# Patient Record
Sex: Male | Born: 1960 | Race: White | Hispanic: No | Marital: Single | Smoking: Former smoker
Health system: Southern US, Community
[De-identification: ages and names within clinical notes are randomized; demographics above are authoritative.]

## PROBLEM LIST (undated history)

## (undated) DIAGNOSIS — Z8719 Personal history of other diseases of the digestive system: Secondary | ICD-10-CM

## (undated) DIAGNOSIS — G4730 Sleep apnea, unspecified: Secondary | ICD-10-CM

## (undated) DIAGNOSIS — J449 Chronic obstructive pulmonary disease, unspecified: Secondary | ICD-10-CM

## (undated) DIAGNOSIS — N189 Chronic kidney disease, unspecified: Secondary | ICD-10-CM

## (undated) DIAGNOSIS — I10 Essential (primary) hypertension: Secondary | ICD-10-CM

## (undated) DIAGNOSIS — K219 Gastro-esophageal reflux disease without esophagitis: Secondary | ICD-10-CM

## (undated) DIAGNOSIS — F32A Depression, unspecified: Secondary | ICD-10-CM

## (undated) DIAGNOSIS — I739 Peripheral vascular disease, unspecified: Secondary | ICD-10-CM

## (undated) DIAGNOSIS — T8859XA Other complications of anesthesia, initial encounter: Secondary | ICD-10-CM

## (undated) DIAGNOSIS — R0600 Dyspnea, unspecified: Secondary | ICD-10-CM

## (undated) DIAGNOSIS — E1143 Type 2 diabetes mellitus with diabetic autonomic (poly)neuropathy: Secondary | ICD-10-CM

## (undated) DIAGNOSIS — I2510 Atherosclerotic heart disease of native coronary artery without angina pectoris: Secondary | ICD-10-CM

## (undated) DIAGNOSIS — I209 Angina pectoris, unspecified: Secondary | ICD-10-CM

## (undated) DIAGNOSIS — M1990 Unspecified osteoarthritis, unspecified site: Secondary | ICD-10-CM

## (undated) DIAGNOSIS — G709 Myoneural disorder, unspecified: Secondary | ICD-10-CM

## (undated) HISTORY — PX: NO PAST SURGERIES: SHX2092

---

## 2005-09-19 ENCOUNTER — Emergency Department: Payer: Self-pay | Admitting: Emergency Medicine

## 2005-09-19 ENCOUNTER — Other Ambulatory Visit: Payer: Self-pay

## 2005-09-26 ENCOUNTER — Ambulatory Visit: Payer: Self-pay | Admitting: Family Medicine

## 2005-09-27 ENCOUNTER — Ambulatory Visit: Payer: Self-pay | Admitting: Family Medicine

## 2006-07-24 ENCOUNTER — Emergency Department (HOSPITAL_COMMUNITY): Admission: EM | Admit: 2006-07-24 | Discharge: 2006-07-24 | Payer: Self-pay | Admitting: Emergency Medicine

## 2006-09-30 ENCOUNTER — Encounter: Admission: RE | Admit: 2006-09-30 | Discharge: 2006-09-30 | Payer: Self-pay | Admitting: Neurosurgery

## 2011-08-16 ENCOUNTER — Encounter (HOSPITAL_COMMUNITY): Payer: Self-pay | Admitting: Pharmacy Technician

## 2011-08-23 ENCOUNTER — Other Ambulatory Visit (HOSPITAL_COMMUNITY): Payer: Self-pay | Admitting: Orthopaedic Surgery

## 2011-08-26 ENCOUNTER — Other Ambulatory Visit: Payer: Self-pay

## 2011-08-26 ENCOUNTER — Encounter (HOSPITAL_COMMUNITY): Payer: Self-pay

## 2011-08-26 ENCOUNTER — Encounter (HOSPITAL_COMMUNITY)
Admission: RE | Admit: 2011-08-26 | Discharge: 2011-08-26 | Disposition: A | Discharge status: Home / Self Care | Payer: Worker's Compensation | Source: Ambulatory Visit | Attending: Orthopaedic Surgery | Admitting: Orthopaedic Surgery

## 2011-08-26 HISTORY — DX: Personal history of other diseases of the digestive system: Z87.19

## 2011-08-26 HISTORY — DX: Gastro-esophageal reflux disease without esophagitis: K21.9

## 2011-08-26 HISTORY — DX: Peripheral vascular disease, unspecified: I73.9

## 2011-08-26 HISTORY — DX: Myoneural disorder, unspecified: G70.9

## 2011-08-26 LAB — CBC
MCH: 31.2 pg (ref 26.0–34.0)
MCHC: 34.2 g/dL (ref 30.0–36.0)
MCV: 91.2 fL (ref 78.0–100.0)
Platelets: 181 10*3/uL (ref 150–400)
WBC: 6.7 10*3/uL (ref 4.0–10.5)

## 2011-08-26 LAB — BASIC METABOLIC PANEL: GFR calc non Af Amer: 81 mL/min — ABNORMAL LOW (ref >90)

## 2011-08-26 LAB — URINALYSIS, ROUTINE W REFLEX MICROSCOPIC
Bilirubin Urine: NEGATIVE
Glucose, UA: >1000 mg/dL — AB
Leukocytes, UA: NEGATIVE

## 2011-08-26 LAB — URINE MICROSCOPIC-ADD ON

## 2011-08-26 LAB — SURGICAL PCR SCREEN: Staphylococcus aureus: NEGATIVE

## 2011-08-26 NOTE — Pre-Procedure Instructions (Signed)
20 Nathaniel Colon  08/26/2011   Your procedure is scheduled on:   Friday 08/30/11  Report to [facility] at 530 AM.  Call this number if you have problems the morning of surgery: [phone]   Remember:   Do not eat food:After Midnight.  May have clear liquids: up to 4 Hours before arrival.  Clear liquids include soda, tea, black coffee, apple or grape juice, broth.  Take these medicines the morning of surgery with A SIP OF WATER:     Do not wear jewelry, make-up or nail polish.  Do not wear lotions, powders, or perfumes. You may wear deodorant.  Do not shave 48 hours prior to surgery.  Do not bring valuables to the hospital.  Contacts, dentures or bridgework may not be worn into surgery.  Leave suitcase in the car. After surgery it may be brought to your room.  For patients admitted to the hospital, checkout time is 11:00 AM the day of discharge.   Patients discharged the day of surgery will not be allowed to drive home.  Name and phone number of your driver:   Special Instructions: CHG Shower Use Special Wash: 1/2 bottle night before surgery and 1/2 bottle morning of surgery.   Please read over the following fact sheets that you were given: Pain Booklet, Coughing and Deep Breathing, MRSA Information and Surgical Site Infection Prevention

## 2011-08-29 MED ORDER — CEFAZOLIN SODIUM 1-5 GM-% IV SOLN
1.0000 g | INTRAVENOUS | Status: AC
  Administered 2011-08-30: 1 g via INTRAVENOUS
  Filled 2011-08-29: qty 50

## 2011-08-30 ENCOUNTER — Encounter (HOSPITAL_COMMUNITY)
Admission: RE | Disposition: A | Discharge status: Home / Self Care | Payer: Self-pay | Source: Ambulatory Visit | Attending: Orthopaedic Surgery

## 2011-08-30 ENCOUNTER — Ambulatory Visit (HOSPITAL_COMMUNITY): Payer: Worker's Compensation | Admitting: Anesthesiology

## 2011-08-30 ENCOUNTER — Encounter (HOSPITAL_COMMUNITY): Payer: Self-pay | Admitting: Anesthesiology

## 2011-08-30 ENCOUNTER — Encounter (HOSPITAL_COMMUNITY): Payer: Self-pay | Admitting: *Deleted

## 2011-08-30 ENCOUNTER — Ambulatory Visit (HOSPITAL_COMMUNITY)
Admission: RE | Admit: 2011-08-30 | Discharge: 2011-09-02 | Disposition: A | Discharge status: Home / Self Care | Payer: Worker's Compensation | Source: Ambulatory Visit | Attending: Orthopaedic Surgery | Admitting: Orthopaedic Surgery

## 2011-08-30 ENCOUNTER — Ambulatory Visit (HOSPITAL_COMMUNITY): Payer: Worker's Compensation

## 2011-08-30 DIAGNOSIS — K219 Gastro-esophageal reflux disease without esophagitis: Secondary | ICD-10-CM | POA: Insufficient documentation

## 2011-08-30 DIAGNOSIS — I739 Peripheral vascular disease, unspecified: Secondary | ICD-10-CM | POA: Insufficient documentation

## 2011-08-30 DIAGNOSIS — M47812 Spondylosis without myelopathy or radiculopathy, cervical region: Secondary | ICD-10-CM | POA: Insufficient documentation

## 2011-08-30 DIAGNOSIS — K449 Diaphragmatic hernia without obstruction or gangrene: Secondary | ICD-10-CM | POA: Insufficient documentation

## 2011-08-30 DIAGNOSIS — F17200 Nicotine dependence, unspecified, uncomplicated: Secondary | ICD-10-CM | POA: Insufficient documentation

## 2011-08-30 DIAGNOSIS — E119 Type 2 diabetes mellitus without complications: Secondary | ICD-10-CM | POA: Insufficient documentation

## 2011-08-30 DIAGNOSIS — M5020 Other cervical disc displacement, unspecified cervical region: Secondary | ICD-10-CM | POA: Diagnosis present

## 2011-08-30 DIAGNOSIS — M7710 Lateral epicondylitis, unspecified elbow: Secondary | ICD-10-CM | POA: Insufficient documentation

## 2011-08-30 DIAGNOSIS — M4802 Spinal stenosis, cervical region: Secondary | ICD-10-CM | POA: Diagnosis present

## 2011-08-30 DIAGNOSIS — M7711 Lateral epicondylitis, right elbow: Secondary | ICD-10-CM | POA: Diagnosis present

## 2011-08-30 HISTORY — PX: ANTERIOR CERVICAL DECOMP/DISCECTOMY FUSION: SHX1161

## 2011-08-30 HISTORY — PX: LATERAL EPICONDYLE RELEASE: SHX1958

## 2011-08-30 LAB — GLUCOSE, CAPILLARY
Glucose-Capillary: 268 mg/dL — ABNORMAL HIGH (ref 70–99)
Glucose-Capillary: 241 mg/dL — ABNORMAL HIGH (ref 70–99)
Glucose-Capillary: 240 mg/dL — ABNORMAL HIGH (ref 70–99)
Glucose-Capillary: 191 mg/dL — ABNORMAL HIGH (ref 70–99)

## 2011-08-30 SURGERY — ANTERIOR CERVICAL DECOMPRESSION/DISCECTOMY FUSION 2 LEVELS
Anesthesia: General | Site: Neck | Laterality: Right | Wound class: Clean

## 2011-08-30 MED ORDER — HYDROMORPHONE HCL PF 1 MG/ML IJ SOLN
0.2500 mg | INTRAMUSCULAR | Status: DC | PRN
  Administered 2011-08-30 (×4): 0.5 mg via INTRAVENOUS

## 2011-08-30 MED ORDER — ZOLPIDEM TARTRATE 10 MG PO TABS: 10.0000 mg | ORAL_TABLET | Freq: Every evening | ORAL | Status: DC | PRN

## 2011-08-30 MED ORDER — SODIUM CHLORIDE 0.9 % IV SOLN: 250.0000 mL | INTRAVENOUS | Status: DC

## 2011-08-30 MED ORDER — DIPHENHYDRAMINE HCL 12.5 MG/5ML PO ELIX
12.5000 mg | ORAL_SOLUTION | Freq: Four times a day (QID) | ORAL | Status: DC | PRN
  Filled 2011-08-30: qty 5

## 2011-08-30 MED ORDER — SENNOSIDES-DOCUSATE SODIUM 8.6-50 MG PO TABS: 1.0000 | ORAL_TABLET | Freq: Every evening | ORAL | Status: DC | PRN

## 2011-08-30 MED ORDER — HYDROCODONE-ACETAMINOPHEN 5-325 MG PO TABS: 1.0000 | ORAL_TABLET | ORAL | Status: DC | PRN

## 2011-08-30 MED ORDER — PANTOPRAZOLE SODIUM 40 MG IV SOLR
40.0000 mg | Freq: Every day | INTRAVENOUS | Status: DC
  Administered 2011-08-30: 40 mg via INTRAVENOUS
  Filled 2011-08-30 (×2): qty 40

## 2011-08-30 MED ORDER — BUPIVACAINE-EPINEPHRINE 0.25% -1:200000 IJ SOLN
INTRAMUSCULAR | Status: DC | PRN
  Administered 2011-08-30: 7 mL

## 2011-08-30 MED ORDER — INSULIN ASPART 100 UNIT/ML ~~LOC~~ SOLN
0.0000 [IU] | Freq: Every day | SUBCUTANEOUS | Status: DC
  Administered 2011-09-01: 2 [IU] via SUBCUTANEOUS

## 2011-08-30 MED ORDER — DIPHENHYDRAMINE HCL 50 MG/ML IJ SOLN
12.5000 mg | Freq: Four times a day (QID) | INTRAMUSCULAR | Status: DC | PRN
  Filled 2011-08-30: qty 0.25

## 2011-08-30 MED ORDER — METFORMIN HCL 500 MG PO TABS
1000.0000 mg | ORAL_TABLET | Freq: Two times a day (BID) | ORAL | Status: DC
  Administered 2011-08-31 – 2011-09-02 (×5): 1000 mg via ORAL
  Filled 2011-08-30 (×7): qty 2

## 2011-08-30 MED ORDER — PHENOL 1.4 % MT LIQD
1.0000 | OROMUCOSAL | Status: DC | PRN
  Filled 2011-08-30: qty 177

## 2011-08-30 MED ORDER — ONDANSETRON HCL 4 MG/2ML IJ SOLN
INTRAMUSCULAR | Status: DC | PRN
  Administered 2011-08-30: 4 mg via INTRAVENOUS

## 2011-08-30 MED ORDER — ACETAMINOPHEN 650 MG RE SUPP: 650.0000 mg | RECTAL | Status: DC | PRN

## 2011-08-30 MED ORDER — OXYCODONE-ACETAMINOPHEN 5-325 MG PO TABS: 1.0000 | ORAL_TABLET | ORAL | Status: AC | PRN

## 2011-08-30 MED ORDER — BUPIVACAINE-EPINEPHRINE PF 0.25-1:200000 % IJ SOLN
INTRAMUSCULAR | Status: DC | PRN
  Administered 2011-08-30: 6 mL

## 2011-08-30 MED ORDER — 0.9 % SODIUM CHLORIDE (POUR BTL) OPTIME
TOPICAL | Status: DC | PRN
  Administered 2011-08-30: 1000 mL

## 2011-08-30 MED ORDER — METHOCARBAMOL 500 MG PO TABS
500.0000 mg | ORAL_TABLET | Freq: Four times a day (QID) | ORAL | Status: DC | PRN
  Administered 2011-08-30 – 2011-09-02 (×5): 500 mg via ORAL
  Filled 2011-08-30 (×6): qty 1

## 2011-08-30 MED ORDER — ONDANSETRON HCL 4 MG/2ML IJ SOLN: 4.0000 mg | INTRAMUSCULAR | Status: DC | PRN

## 2011-08-30 MED ORDER — THROMBIN 20000 UNITS EX KIT
PACK | CUTANEOUS | Status: DC | PRN
  Administered 2011-08-30: 09:00:00 via TOPICAL

## 2011-08-30 MED ORDER — GLIMEPIRIDE 4 MG PO TABS
4.0000 mg | ORAL_TABLET | Freq: Two times a day (BID) | ORAL | Status: DC
  Administered 2011-08-30 – 2011-09-02 (×6): 4 mg via ORAL
  Filled 2011-08-30 (×8): qty 1

## 2011-08-30 MED ORDER — INSULIN ASPART 100 UNIT/ML ~~LOC~~ SOLN
0.0000 [IU] | Freq: Three times a day (TID) | SUBCUTANEOUS | Status: DC
  Administered 2011-08-30: 5 [IU] via SUBCUTANEOUS
  Administered 2011-08-31 (×2): 3 [IU] via SUBCUTANEOUS
  Administered 2011-08-31: 15 [IU] via SUBCUTANEOUS
  Administered 2011-09-01: 2 [IU] via SUBCUTANEOUS
  Administered 2011-09-01: 5 [IU] via SUBCUTANEOUS
  Administered 2011-09-01 – 2011-09-02 (×2): 3 [IU] via SUBCUTANEOUS
  Administered 2011-09-02: 2 [IU] via SUBCUTANEOUS
  Filled 2011-08-30: qty 3

## 2011-08-30 MED ORDER — LACTATED RINGERS IV SOLN
INTRAVENOUS | Status: DC | PRN
  Administered 2011-08-30 (×2): via INTRAVENOUS

## 2011-08-30 MED ORDER — DOCUSATE SODIUM 100 MG PO CAPS
100.0000 mg | ORAL_CAPSULE | Freq: Two times a day (BID) | ORAL | Status: DC
  Administered 2011-08-30 – 2011-09-02 (×6): 100 mg via ORAL
  Filled 2011-08-30 (×7): qty 1

## 2011-08-30 MED ORDER — MORPHINE SULFATE (PF) 1 MG/ML IV SOLN
INTRAVENOUS | Status: DC
  Administered 2011-08-30: 3 mg via INTRAVENOUS
  Administered 2011-08-30: 10 mg via INTRAVENOUS
  Administered 2011-08-30: 6.64 mg via INTRAVENOUS
  Administered 2011-08-31: 4.5 mg via INTRAVENOUS
  Administered 2011-08-31 (×2): 12 mg via INTRAVENOUS
  Administered 2011-08-31 (×2): 6 mg via INTRAVENOUS
  Filled 2011-08-30 (×4): qty 25

## 2011-08-30 MED ORDER — KETOROLAC TROMETHAMINE 30 MG/ML IJ SOLN
INTRAMUSCULAR | Status: AC
  Filled 2011-08-30: qty 1

## 2011-08-30 MED ORDER — OXYCODONE-ACETAMINOPHEN 5-325 MG PO TABS
1.0000 | ORAL_TABLET | ORAL | Status: DC | PRN
  Administered 2011-08-31 – 2011-09-02 (×8): 2 via ORAL
  Filled 2011-08-30 (×8): qty 2

## 2011-08-30 MED ORDER — POTASSIUM CHLORIDE IN NACL 20-0.45 MEQ/L-% IV SOLN
INTRAVENOUS | Status: DC
  Administered 2011-08-30: via INTRAVENOUS
  Filled 2011-08-30 (×8): qty 1000

## 2011-08-30 MED ORDER — FLEET ENEMA 7-19 GM/118ML RE ENEM: 1.0000 | ENEMA | Freq: Once | RECTAL | Status: AC | PRN

## 2011-08-30 MED ORDER — VECURONIUM BROMIDE 10 MG IV SOLR
INTRAVENOUS | Status: DC | PRN
  Administered 2011-08-30 (×2): 2 mg via INTRAVENOUS
  Administered 2011-08-30: 1 mg via INTRAVENOUS

## 2011-08-30 MED ORDER — SUFENTANIL CITRATE 50 MCG/ML IV SOLN
INTRAVENOUS | Status: DC | PRN
  Administered 2011-08-30: 5 ug via INTRAVENOUS
  Administered 2011-08-30: 25 ug via INTRAVENOUS
  Administered 2011-08-30: 5 ug via INTRAVENOUS

## 2011-08-30 MED ORDER — NALOXONE HCL 0.4 MG/ML IJ SOLN
0.4000 mg | INTRAMUSCULAR | Status: DC | PRN
  Filled 2011-08-30: qty 1

## 2011-08-30 MED ORDER — GLYCOPYRROLATE 0.2 MG/ML IJ SOLN
INTRAMUSCULAR | Status: DC | PRN
  Administered 2011-08-30: .6 mg via INTRAVENOUS

## 2011-08-30 MED ORDER — ALUM & MAG HYDROXIDE-SIMETH 200-200-20 MG/5ML PO SUSP
30.0000 mL | Freq: Four times a day (QID) | ORAL | Status: DC | PRN
  Administered 2011-08-30: 30 mL via ORAL
  Filled 2011-08-30: qty 30

## 2011-08-30 MED ORDER — PROPOFOL 10 MG/ML IV EMUL
INTRAVENOUS | Status: DC | PRN
  Administered 2011-08-30: 200 mg via INTRAVENOUS

## 2011-08-30 MED ORDER — PHENYLEPHRINE HCL 10 MG/ML IJ SOLN
INTRAMUSCULAR | Status: DC | PRN
  Administered 2011-08-30 (×3): 40 ug via INTRAVENOUS

## 2011-08-30 MED ORDER — ONDANSETRON HCL 4 MG/2ML IJ SOLN: 4.0000 mg | Freq: Once | INTRAMUSCULAR | Status: DC | PRN

## 2011-08-30 MED ORDER — HYDROMORPHONE HCL PF 1 MG/ML IJ SOLN
INTRAMUSCULAR | Status: AC
  Filled 2011-08-30: qty 1

## 2011-08-30 MED ORDER — BISACODYL 10 MG RE SUPP: 10.0000 mg | Freq: Every day | RECTAL | Status: DC | PRN

## 2011-08-30 MED ORDER — LIDOCAINE HCL (CARDIAC) 20 MG/ML IV SOLN
INTRAVENOUS | Status: DC | PRN
  Administered 2011-08-30: 50 mg via INTRAVENOUS

## 2011-08-30 MED ORDER — OXYCODONE-ACETAMINOPHEN 5-325 MG PO TABS: 1.0000 | ORAL_TABLET | ORAL | Status: DC | PRN

## 2011-08-30 MED ORDER — SODIUM CHLORIDE 0.9 % IJ SOLN: 9.0000 mL | INTRAMUSCULAR | Status: DC | PRN

## 2011-08-30 MED ORDER — SODIUM CHLORIDE 0.9 % IJ SOLN
3.0000 mL | Freq: Two times a day (BID) | INTRAMUSCULAR | Status: DC
  Administered 2011-08-31 – 2011-09-01 (×3): 3 mL via INTRAVENOUS

## 2011-08-30 MED ORDER — ACETAMINOPHEN 325 MG PO TABS: 650.0000 mg | ORAL_TABLET | ORAL | Status: DC | PRN

## 2011-08-30 MED ORDER — ONDANSETRON HCL 4 MG/2ML IJ SOLN
4.0000 mg | Freq: Four times a day (QID) | INTRAMUSCULAR | Status: DC | PRN
  Filled 2011-08-30: qty 2

## 2011-08-30 MED ORDER — KETOROLAC TROMETHAMINE 30 MG/ML IJ SOLN
30.0000 mg | Freq: Once | INTRAMUSCULAR | Status: AC
Start: 2011-08-30 — End: 2011-08-30
  Administered 2011-08-30: 30 mg via INTRAVENOUS

## 2011-08-30 MED ORDER — SODIUM CHLORIDE 0.9 % IJ SOLN: 3.0000 mL | INTRAMUSCULAR | Status: DC | PRN

## 2011-08-30 MED ORDER — METHOCARBAMOL 100 MG/ML IJ SOLN
500.0000 mg | Freq: Four times a day (QID) | INTRAVENOUS | Status: DC | PRN
  Administered 2011-08-30: 500 mg via INTRAVENOUS
  Filled 2011-08-30: qty 5

## 2011-08-30 MED ORDER — CEFAZOLIN SODIUM 1-5 GM-% IV SOLN
1.0000 g | Freq: Three times a day (TID) | INTRAVENOUS | Status: AC
  Administered 2011-08-30 (×2): 1 g via INTRAVENOUS
  Filled 2011-08-30 (×2): qty 50

## 2011-08-30 MED ORDER — MENTHOL 3 MG MT LOZG
1.0000 | LOZENGE | OROMUCOSAL | Status: DC | PRN
  Filled 2011-08-30: qty 9

## 2011-08-30 MED ORDER — SIMVASTATIN 20 MG PO TABS
20.0000 mg | ORAL_TABLET | Freq: Every day | ORAL | Status: DC
  Administered 2011-08-30 – 2011-09-01 (×3): 20 mg via ORAL
  Filled 2011-08-30 (×4): qty 1

## 2011-08-30 MED ORDER — PIOGLITAZONE HCL 45 MG PO TABS
45.0000 mg | ORAL_TABLET | Freq: Every day | ORAL | Status: DC
  Administered 2011-08-31 – 2011-09-02 (×3): 45 mg via ORAL
  Filled 2011-08-30 (×3): qty 1

## 2011-08-30 MED ORDER — ROCURONIUM BROMIDE 100 MG/10ML IV SOLN
INTRAVENOUS | Status: DC | PRN
  Administered 2011-08-30: 50 mg via INTRAVENOUS

## 2011-08-30 MED ORDER — NEOSTIGMINE METHYLSULFATE 1 MG/ML IJ SOLN
INTRAMUSCULAR | Status: DC | PRN
  Administered 2011-08-30: 5 mg via INTRAVENOUS

## 2011-08-30 SURGICAL SUPPLY — 68 items
BANDAGE CONFORM 3  STR LF (GAUZE/BANDAGES/DRESSINGS) IMPLANT
BANDAGE ELASTIC 4 VELCRO ST LF (GAUZE/BANDAGES/DRESSINGS) ×3 IMPLANT
BANDAGE ELASTIC 6 VELCRO ST LF (GAUZE/BANDAGES/DRESSINGS) ×3 IMPLANT
BENZOIN TINCTURE PRP APPL 2/3 (GAUZE/BANDAGES/DRESSINGS) ×3 IMPLANT
BLADE SURG 10 STRL SS (BLADE) ×3 IMPLANT
BLADE SURG 15 STRL LF DISP TIS (BLADE) ×2 IMPLANT
BLADE SURG 15 STRL SS (BLADE) ×1
BLADE SURG ROTATE 9660 (MISCELLANEOUS) IMPLANT
BUR ROUND FLUTED 4 SOFT TCH (BURR) IMPLANT
CLOSURE STERI STRIP 1/2 X4 (GAUZE/BANDAGES/DRESSINGS) ×6 IMPLANT
CLOTH BEACON ORANGE TIMEOUT ST (SAFETY) ×6 IMPLANT
COLLAR CERV LO CONTOUR FIRM DE (SOFTGOODS) ×3 IMPLANT
COMPOSITE CERV ALLOGRAFT 7MM (Orthopedic Implant) ×6 IMPLANT
CORDS BIPOLAR (ELECTRODE) ×6 IMPLANT
COVER SURGICAL LIGHT HANDLE (MISCELLANEOUS) ×6 IMPLANT
CUFF TOURNIQUET SINGLE 18IN (TOURNIQUET CUFF) IMPLANT
CUFF TOURNIQUET SINGLE 24IN (TOURNIQUET CUFF) IMPLANT
DRAPE C-ARM 42X72 X-RAY (DRAPES) ×3 IMPLANT
DRAPE MICROSCOPE LEICA (MISCELLANEOUS) ×3 IMPLANT
DRAPE PROXIMA HALF (DRAPES) ×3 IMPLANT
DURAPREP 26ML APPLICATOR (WOUND CARE) ×3 IMPLANT
DURAPREP 6ML APPLICATOR 50/CS (WOUND CARE) ×3 IMPLANT
ELECT CAUTERY BLADE 6.4 (BLADE) ×3 IMPLANT
ELECT COATED BLADE 2.86 ST (ELECTRODE) ×3 IMPLANT
ELECT REM PT RETURN 9FT ADLT (ELECTROSURGICAL) ×3
ELECTRODE REM PT RTRN 9FT ADLT (ELECTROSURGICAL) ×2 IMPLANT
EVACUATOR 1/8 PVC DRAIN (DRAIN) ×3 IMPLANT
GAUZE SPONGE 4X4 12PLY STRL LF (GAUZE/BANDAGES/DRESSINGS) ×6 IMPLANT
GAUZE XEROFORM 1X8 LF (GAUZE/BANDAGES/DRESSINGS) ×3 IMPLANT
GLOVE BIOGEL PI IND STRL 7.5 (GLOVE) ×2 IMPLANT
GLOVE BIOGEL PI IND STRL 8 (GLOVE) ×4 IMPLANT
GLOVE BIOGEL PI INDICATOR 7.5 (GLOVE) ×1
GLOVE BIOGEL PI INDICATOR 8 (GLOVE) ×2
GLOVE ECLIPSE 7.0 STRL STRAW (GLOVE) ×3 IMPLANT
GLOVE ORTHO TXT STRL SZ7.5 (GLOVE) ×6 IMPLANT
GOWN PREVENTION PLUS LG XLONG (DISPOSABLE) IMPLANT
GOWN PREVENTION PLUS XLARGE (GOWN DISPOSABLE) ×3 IMPLANT
GOWN STRL NON-REIN LRG LVL3 (GOWN DISPOSABLE) ×12 IMPLANT
HEAD HALTER (SOFTGOODS) ×3 IMPLANT
HEMOSTAT SURGICEL 2X14 (HEMOSTASIS) IMPLANT
KIT BASIN OR (CUSTOM PROCEDURE TRAY) ×6 IMPLANT
KIT ROOM TURNOVER OR (KITS) ×6 IMPLANT
MANIFOLD NEPTUNE II (INSTRUMENTS) ×6 IMPLANT
NEEDLE 25GX 5/8IN NON SAFETY (NEEDLE) ×3 IMPLANT
NS IRRIG 1000ML POUR BTL (IV SOLUTION) ×6 IMPLANT
PACK ORTHO CERVICAL (CUSTOM PROCEDURE TRAY) ×3 IMPLANT
PACK ORTHO EXTREMITY (CUSTOM PROCEDURE TRAY) ×3 IMPLANT
PAD ARMBOARD 7.5X6 YLW CONV (MISCELLANEOUS) ×12 IMPLANT
PATTIES SURGICAL .5 X.5 (GAUZE/BANDAGES/DRESSINGS) IMPLANT
SPONGE GAUZE 4X4 12PLY (GAUZE/BANDAGES/DRESSINGS) ×6 IMPLANT
SPONGE LAP 18X18 X RAY DECT (DISPOSABLE) ×3 IMPLANT
STAPLER VISISTAT 35W (STAPLE) ×3 IMPLANT
STRIP CLOSURE SKIN 1/2X4 (GAUZE/BANDAGES/DRESSINGS) ×6 IMPLANT
SUCTION FRAZIER TIP 10 FR DISP (SUCTIONS) IMPLANT
SURGIFLO TRUKIT (HEMOSTASIS) IMPLANT
SUT ETHILON 4 0 P 3 18 (SUTURE) IMPLANT
SUT VIC AB 2-0 CT1 27 (SUTURE) ×1
SUT VIC AB 2-0 CT1 TAPERPNT 27 (SUTURE) ×2 IMPLANT
SUT VIC AB 3-0 FS2 27 (SUTURE) ×3 IMPLANT
SUT VIC AB 3-0 X1 27 (SUTURE) ×3 IMPLANT
SUT VICRYL 4-0 PS2 18IN ABS (SUTURE) ×6 IMPLANT
SYR 30ML SLIP (SYRINGE) ×3 IMPLANT
SYR BULB 3OZ (MISCELLANEOUS) ×3 IMPLANT
TAPE CLOTH SURG 6X10 WHT LF (GAUZE/BANDAGES/DRESSINGS) ×3 IMPLANT
TOWEL OR 17X24 6PK STRL BLUE (TOWEL DISPOSABLE) ×6 IMPLANT
TOWEL OR 17X26 10 PK STRL BLUE (TOWEL DISPOSABLE) ×6 IMPLANT
TUBE CONNECTING 12X1/4 (SUCTIONS) IMPLANT
WATER STERILE IRR 1000ML POUR (IV SOLUTION) ×6 IMPLANT

## 2011-08-30 NOTE — Anesthesia Procedure Notes (Signed)
Procedure Name: Intubation Date/Time: 08/30/2011 8:06 AM Performed by: Romie Minus Pre-anesthesia Checklist: Emergency Drugs available, Patient identified, Suction available and Patient being monitored Patient Re-evaluated:Patient Re-evaluated prior to inductionOxygen Delivery Method: Circle System Utilized Preoxygenation: Pre-oxygenation with 100% oxygen Intubation Type: IV induction Ventilation: Mask ventilation without difficulty and Oral airway inserted - appropriate to patient size Laryngoscope Size: Miller and 2 Grade View: Grade II Tube type: Oral Tube size: 7.5 mm Number of attempts: 1 Secured at: 22 cm Tube secured with: Tape Dental Injury: Teeth and Oropharynx as per pre-operative assessment

## 2011-08-30 NOTE — Anesthesia Preprocedure Evaluation (Addendum)
Anesthesia Evaluation  Patient identified by MRN, date of birth, ID band Patient awake    Reviewed: Allergy & Precautions, H&P , NPO status , Patient's Chart, lab work & pertinent test results  Airway       Dental   Pulmonary neg pulmonary ROS, Current Smoker,    Pulmonary exam normal       Cardiovascular + Peripheral Vascular Disease regular Normal    Neuro/Psych Diabetic neuropathy bilateral feet  Neuromuscular disease    GI/Hepatic Neg liver ROS, hiatal hernia, GERD-  ,  Endo/Other  Diabetes mellitus-, Type 2, Oral Hypoglycemic Agents  Renal/GU negative Renal ROS  Genitourinary negative   Musculoskeletal   Abdominal   Peds  Hematology negative hematology ROS (+)   Anesthesia Other Findings   Reproductive/Obstetrics                          Anesthesia Physical Anesthesia Plan  ASA: III  Anesthesia Plan: General   Post-op Pain Management:    Induction: Intravenous  Airway Management Planned: Oral ETT  Additional Equipment:   Intra-op Plan:   Post-operative Plan: Extubation in OR  Informed Consent:   Plan Discussed with: CRNA, Anesthesiologist and Surgeon  Anesthesia Plan Comments:         Anesthesia Quick Evaluation

## 2011-08-30 NOTE — Transfer of Care (Signed)
Immediate Anesthesia Transfer of Care Note  Patient: Nathaniel Colon  Procedure(s) Performed:  ANTERIOR CERVICAL DECOMPRESSION/DISCECTOMY FUSION 2 LEVELS - C5-6, C6-7 Anterior Cervical Discectomy and Fusion, Allograft, Plate; TENNIS ELBOW RELEASE - Right Lateral Epicondyle Release, Drilling Repair  Patient Location: PACU  Anesthesia Type: General  Level of Consciousness: sedated  Airway & Oxygen Therapy: Patient Spontanous Breathing and Patient connected to nasal cannula oxygen  Post-op Assessment: Report given to PACU RN and Post -op Vital signs reviewed and stable  Post vital signs: stable Filed Vitals:   08/30/11 1130  BP:   Pulse:   Temp: 36.9 C  Resp:     Complications: No apparent anesthesia complications

## 2011-08-30 NOTE — H&P (Addendum)
Nathaniel Colon is an 51 y.o. male.   Chief Complaint: neck and referred arm pain HPI: 51 yo male with more than 6 months inctreased neck and arm pain failed conservative Tx with pain and hand numbness. Pain with ADL's  Past Medical History  Diagnosis Date  . Peripheral vascular disease     PERIPHERAL NEUROPATHY   . Diabetes mellitus   . GERD (gastroesophageal reflux disease)   . H/O hiatal hernia   . Neuromuscular disorder     MUSCLE SPASMS    Past Surgical History  Procedure Date  . No past surgeries     History reviewed. No pertinent family history. Social History:  reports that he has been smoking.  He uses smokeless tobacco. He reports that he drinks alcohol. He reports that he does not use illicit drugs.  Allergies: No Known Allergies  Medications Prior to Admission  Medication Dose Route Frequency Provider Last Rate Last Dose  . ceFAZolin (ANCEF) IVPB 1 g/50 mL premix  1 g Intravenous 60 min Pre-Op Eldred Manges, MD       No current outpatient prescriptions on file as of 08/30/2011.    Results for orders placed during the hospital encounter of 08/30/11 (from the past 48 hour(s))  GLUCOSE, CAPILLARY     Status: Abnormal   Collection Time   08/30/11  6:09 AM      Component Value Range Comment   Glucose-Capillary 268 (*) 70 - 99 (mg/dL)    No results found.  Review of Systems  Constitutional: Negative.   HENT: Positive for neck pain.   Eyes: Negative.   Respiratory: Negative.   Cardiovascular: Negative.   Gastrointestinal: Negative.   Skin: Negative.   Neurological: Positive for tingling.  Endo/Heme/Allergies: Negative.   Psychiatric/Behavioral: Negative.     Blood pressure 136/86, pulse 64, temperature 98.1 F (36.7 Colon), temperature source Oral, resp. rate 16, SpO2 98.00%. Physical Exam  Constitutional: He appears well-developed.  HENT:  Head: Normocephalic.  Eyes: Pupils are equal, round, and reactive to light.  Neck: No JVD present. No tracheal deviation  present. No thyromegaly present.       Pos. spurling right and left  Cardiovascular: Normal rate.   Respiratory: Effort normal.  GI: Soft.  Musculoskeletal: He exhibits no edema and no tenderness.       2plus DTR's bilat UE  Lymphadenopathy:    He has no cervical adenopathy.  Neurological: He is alert.  Skin: Skin is warm.  Psychiatric: He has a normal mood and affect. His behavior is normal.     Assessment/Plan C5-6,C6-7 spondylosis for fusion and plating. Risks discussed. He requests we proceed. Lateral epicondylitis , failed injection , splinting.  For lateral epicondylar release , drilling and repair.   Nathaniel Colon 08/30/2011, 7:19 AM

## 2011-08-30 NOTE — Preoperative (Signed)
Beta Blockers   Reason not to administer Beta Blockers:Not Applicable 

## 2011-08-30 NOTE — OR Nursing (Signed)
Procedure 2: ACDF Cervical 5-6 Cervical 6-7 Start: [phone]

## 2011-08-30 NOTE — Interval H&P Note (Signed)
History and Physical Interval Note:  08/30/2011 7:23 AM  Nathaniel Colon  has presented today for surgery, with the diagnosis of C5-6, C6-7 Spondylosis, C5-6 Stenosis, Right elbow lateral epicondylitis  The various methods of treatment have been discussed with the patient and family. After consideration of risks, benefits and other options for treatment, the patient has consented to  Procedure(s): ANTERIOR CERVICAL DECOMPRESSION/DISCECTOMY FUSION 2 LEVELS TENNIS ELBOW RELEASE as a surgical intervention .  The patients' history has been reviewed, patient examined, no change in status, stable for surgery.  I have reviewed the patients' chart and labs.  Questions were answered to the patient's satisfaction.     Dymphna Wadley C

## 2011-08-30 NOTE — Brief Op Note (Signed)
08/30/2011  11:26 AM  PATIENT:  Nathaniel Colon  51 y.o. male  PRE-OPERATIVE DIAGNOSIS:  C5-6, C6-7 Spondylosis, C5-6 Stenosis, Right elbow lateral epicondylitis  POST-OPERATIVE DIAGNOSIS:  C5-6, C6-7 Spondylosis, C5-6 Stenosis, Right elbow lateral  PROCEDURE:  Procedure(s): ANTERIOR CERVICAL DECOMPRESSION/DISCECTOMY FUSION 2 LEVELS TENNIS ELBOW RELEASE  SURGEON:  Surgeon(s): Eldred Manges, MD Kerrin Champagne, MD  PHYSICIAN ASSISTANT: none  ASSISTANTS: none   ANESTHESIA:   local and general  EBL:  Total I/O In: 2000 [I.V.:2000] Out: 50 [Blood:50]  BLOOD ADMINISTERED:none  DRAINS: (anterior neck) Hemovact drain(s) in the anterior neck with  Suction Open   LOCAL MEDICATIONS USED:  MARCAINE 0? CC  SPECIMEN:  No Specimen  DISPOSITION OF SPECIMEN:  N/A  COUNTS:  YES  TOURNIQUET:   Total Tourniquet Time Documented: Upper Arm (Right) - 10 minutes  DICTATION: .Note written in EPIC  PLAN OF CARE: Admit for overnight observation  PATIENT DISPOSITION:  PACU - hemodynamically stable.   Delay start of Pharmacological VTE agent (>24hrs) due to surgical blood loss or risk of bleeding:  {YES/NO/NOT APPLICABLE:20182

## 2011-08-30 NOTE — Anesthesia Postprocedure Evaluation (Signed)
  Anesthesia Post-op Note  Patient: Nathaniel Colon  Procedure(s) Performed:  ANTERIOR CERVICAL DECOMPRESSION/DISCECTOMY FUSION 2 LEVELS - C5-6, C6-7 Anterior Cervical Discectomy and Fusion, Allograft, Plate; TENNIS ELBOW RELEASE - Right Lateral Epicondyle Release, Drilling Repair  Patient Location: PACU  Anesthesia Type: General  Level of Consciousness: awake, oriented, sedated and patient cooperative  Airway and Oxygen Therapy: Patient Spontanous Breathing and Patient connected to nasal cannula oxygen  Post-op Pain: mild  Post-op Assessment: Post-op Vital signs reviewed, Patient's Cardiovascular Status Stable, Respiratory Function Stable, Patent Airway, No signs of Nausea or vomiting and Pain level controlled  Post-op Vital Signs: stable  Complications: No apparent anesthesia complications

## 2011-08-31 LAB — GLUCOSE, CAPILLARY
Glucose-Capillary: 444 mg/dL — ABNORMAL HIGH (ref 70–99)
Glucose-Capillary: 215 mg/dL — ABNORMAL HIGH (ref 70–99)

## 2011-08-31 MED ORDER — PANTOPRAZOLE SODIUM 40 MG PO TBEC
40.0000 mg | DELAYED_RELEASE_TABLET | Freq: Every day | ORAL | Status: DC
  Administered 2011-08-31 – 2011-09-02 (×3): 40 mg via ORAL
  Filled 2011-08-31 (×3): qty 1

## 2011-08-31 MED ORDER — INSULIN ASPART 100 UNIT/ML ~~LOC~~ SOLN
15.0000 [IU] | Freq: Once | SUBCUTANEOUS | Status: AC
  Administered 2011-08-31: 15 [IU] via SUBCUTANEOUS

## 2011-08-31 NOTE — Progress Notes (Signed)
Subjective: Pt stable - states arm hurts right   Objective: Vital signs in last 24 hours: Temp:  [97.9 F (36.6 C)-98.5 F (36.9 C)] 97.9 F (36.6 C) (01/19 0609) Pulse Rate:  [74-93] 93  (01/19 0609) Resp:  [9-20] 18  (01/19 0609) BP: (113-151)/(68-86) 119/73 mmHg (01/19 0609) SpO2:  [94 %-98 %] 94 % (01/19 0609) Weight:  [79.833 kg (176 lb)] 79.833 kg (176 lb) (01/18 2300)  Intake/Output from previous day: 01/18 0701 - 01/19 0700 In: 2791.3 [I.V.:2736.3; IV Piggyback:55] Out: 700 [Drains:600; Blood:100] Intake/Output this shift:    Exam:  Sensation intact distally Intact pulses distally Dorsiflexion/Plantar flexion intact  Labs: No results found for this basename: HGB:5 in the last 72 hours No results found for this basename: WBC:2,RBC:2,HCT:2,PLT:2 in the last 72 hours No results found for this basename: NA:2,K:2,CL:2,CO2:2,BUN:2,CREATININE:2,GLUCOSE:2,CALCIUM:2 in the last 72 hours No results found for this basename: LABPT:2,INR:2 in the last 72 hours  Assessment/Plan: cbg high this am - needs to cont on iv pain meds and follow cbg - pt todat - possible dc am   DEAN,GREGORY SCOTT 08/31/2011, 7:20 AM

## 2011-08-31 NOTE — Op Note (Signed)
NAMEKINCAID, TIGER                ACCOUNT NO.:  [id number]  MEDICAL RECORD NO.:  [id number]  LOCATION:  5015                         FACILITY:  [facility]  PHYSICIAN:  Colvin Blatt C. Ophelia Charter, M.D.    DATE OF BIRTH:  [birth year]  DATE OF PROCEDURE: DATE OF DISCHARGE:                              OPERATIVE REPORT   PREOPERATIVE DIAGNOSES: 1. Chronic right lateral epicondylitis. 2. 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 3. C6 radiculopathy.  POSTOPERATIVE DIAGNOSES: 1. Chronic right lateral epicondylitis. 2. 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 3. C6  radiculopathy.  PROCEDURE: 1. Right lateral epicondylar release, debridement, drilling, and     repair for chronic tennis elbow. 2. C5-6, C6-7 anterior cervical diskectomy and fusion, allograft and     plate.  SURGEON:  Nakia Koble C. Ophelia Charter, MD Assistant:  Vira Browns MD ANESTHESIA:  GOT plus Marcaine local, 6 mL elbow, 6 mL neck.  DRAINS:  One Hemovac neck.  PLATE:  Biomet VueLock 14-mm screws, 36-mm plate.  ASSISTANT:  Vira Browns, MD  After induction of general anesthesia and orotracheal intubation with horseshoe head holder, arm board attached on the right side.  Arm was prepared 1st while the instruments were being ready for the cervical fusion.  Proximal arm tourniquet was applied, time-out procedure was completed.  Prepping and draping was performed with DuraPrep. Preoperative Ancef was given.  Extremity sheets, drapes.  After impervious stockinette, Coban was applied.  Sterile skin marker was used over the lateral epicondyle.  Arm was elevated, wrapped in Esmarch, tourniquet inflated.  A C-shaped incision was made over the lateral epicondyle.  Subcutaneous tissue was sharply dissected.  Small bleeders were controlled with the cautery.  Extensor tendon was split off the lateral epicondyle.  The midportion shows some degenerative chronic changes, this area was excised and there was  complete release of extensors of the lateral epicondyle.  Lateral collateral ligament was spared and release went down to the joint capsule.  Lateral epicondyle was then debrided with a Matt Holmes rongeur, and then using 0.045 K-wire, multiple drill holes were drilled into the lateral epicondyle with the backflow of marrow fat.  Split tendon was then repaired side-to-side with 2-0 Vicryl.  It was sutured proximally to adjacent tissue. Tourniquet was deflated.  Subcutaneous tissue was reapproximated with 3- 0 Vicryl, and 3-0 subcuticular skin closure.  Tincture of benzoin, Steri- Strips, Marcaine infiltration, 4x4s and Ace wrap were applied.  The arm was then tucked to side for the cervical procedure.  Dr. Otelia Sergeant arrived and was present for the cervical procedure.  Neck was prepped after head halter traction has been applied and mini time-out was performed.  Area was squared with towels, sterile skin marker was used on the neck.  Betadine, Steri-Drape application, sterile Mayo stand at the head and thyroid sheets and drapes.  Incision was made starting at the midline, extending to the left based on palpable landmarks, correct thyroid cartilage, carotid tubercle.  Platysma was divided and omohyoid was noted and dissection below the C6-7, which was the most severe level.  Self-retaining Cloward retractor was placed, teeth blades right and left, smooth blades up and down, preserving the omohyoid  muscle.  Large spur was present, 25 short needle was placed and confirmed with lateral C-arm, this was the C6-7 level.  Spur was removed.  Disk was sequentially removed.  Operative microscope was draped and brought in and used for take down of the posterior aspect of the disk uncovertebral stripping.  The posterior longitudinal ligament was taken down.  There were extruded disk fragments were present on the right, partially at the midline but mostly right paracentral.  Posterior longitudinal ligament was  completely removed so the dura was visualized all the way across.  Trial 6 Sizer showed good room on each side, right and left, for egress of fluids but was not quite tight enough, 7 gave a nice fit, distracted and restored the normal height of the disk and was snugged.  Uncovertebral joints were carefully probed with a black nerve hook, the level was dry, 7 lordotic graft, cortical cancellous was rehydrated and then with CRNA applying traction, it was inserted and countersunk 1-2 mm.  The graft had been marked anteriorly before it was hydrated and a 20 mL syringe with saline and it was exactly at the midline and did not rotate.  Identical procedure was repeated at C5-6 level.  Posterior longitudinal ligament was partially taken down.  There was disk protrusion at the midline at this level.  Uncovertebral spurring on right and left which were stripped with Cloward curettes and Karlin curettes.  The 7-mm graft was inserted at this level as well. After rehydration, countersunk 2 mm.  C-arm was brought in, checked. Initially, 32 plate was tried and then progressing up to 36 plate, which covered the levels well.  The inferior screws had to be angled slightly caudal but did not catch the graft and the prong in the C6 vertebra was exactly in the middle of the vertebra.  The 14-mm screws were filled in all 6 spots, hand drilling, insertion of the screw, checking under C- arm.  Final spot pictures were taken.  Hemovac was placed with a technique.  Operative level was dry.  Some thrombin with Gelfoam was used.  The upper level had a little bit of epidural oozing and was dry at the time of graft insertion.  A 3-0 Vicryl in the platysma and 4-0 Vicryl subcuticular closure.  Tincture of benzoin, Steri-Strips, Marcaine infiltration, postop dressing, soft cervical collar. Instrument count, needle count was correct.  The patient was transferred to recovery room in stable condition.     Riker Collier C.  Ophelia Charter, M.D.     MCY/MEDQ  D:  08/30/2011  T:  08/31/2011  Job:  [id number]

## 2011-08-31 NOTE — Significant Event (Signed)
Patient Nathaniel Colon is 444 this AM. On call for Dr. Ophelia Charter was called at 6:20 AM and orders were received. Will continue to monitor.

## 2011-09-01 LAB — GLUCOSE, CAPILLARY: Glucose-Capillary: 236 mg/dL — ABNORMAL HIGH (ref 70–99)

## 2011-09-01 MED ORDER — DIAZEPAM 5 MG/ML IJ SOLN
5.0000 mg | Freq: Three times a day (TID) | INTRAMUSCULAR | Status: AC | PRN
  Administered 2011-09-01 (×2): 5 mg via INTRAVENOUS
  Filled 2011-09-01 (×3): qty 2

## 2011-09-01 MED ORDER — KETOROLAC TROMETHAMINE 15 MG/ML IJ SOLN
15.0000 mg | Freq: Three times a day (TID) | INTRAMUSCULAR | Status: DC
  Administered 2011-09-01 – 2011-09-02 (×4): 15 mg via INTRAVENOUS
  Filled 2011-09-01 (×7): qty 1

## 2011-09-01 MED ORDER — KETOROLAC TROMETHAMINE 30 MG/ML IJ SOLN
30.0000 mg | Freq: Once | INTRAMUSCULAR | Status: AC
  Administered 2011-09-01: 30 mg via INTRAVENOUS
  Filled 2011-09-01 (×2): qty 1

## 2011-09-01 MED ORDER — DIAZEPAM 5 MG/ML IJ SOLN: 5.0000 mg | Freq: Three times a day (TID) | INTRAMUSCULAR | Status: DC | PRN

## 2011-09-01 NOTE — Progress Notes (Signed)
Pt stable - felt choking sensation last pm - took toradol and valium better this am - currently eating breakfast vss Arm splint and neck brace in place Will keep today to allow swelling to decrease - still has subjective feelings of choking and resp issues - cont toradol and valium

## 2011-09-02 LAB — GLUCOSE, CAPILLARY

## 2011-09-02 MED ORDER — METHOCARBAMOL 500 MG PO TABS: 500.0000 mg | ORAL_TABLET | Freq: Four times a day (QID) | ORAL | Status: AC | PRN

## 2011-09-02 NOTE — Progress Notes (Addendum)
Subjective: More comfortable today.  Swallowing better.  Still taking small bites and sips.  Walking in hallway.  Ready for discharge  Objective: Vital signs in last 24 hours: Temp:  [97.9 F (36.6 C)-98.5 F (36.9 C)] 98.3 F (36.8 C) (01/21 0550) Pulse Rate:  [71-75] 71  (01/21 0550) Resp:  [16-18] 16  (01/21 0550) BP: (105-146)/(60-91) 105/60 mmHg (01/21 0550) SpO2:  [96 %-99 %] 99 % (01/21 0550)  Intake/Output from previous day: 01/20 0701 - 01/21 0700 In: 1590 [P.O.:1590] Out: -  Intake/Output this shift:    No results found for this basename: HGB:5 in the last 72 hours No results found for this basename: WBC:2,RBC:2,HCT:2,PLT:2 in the last 72 hours No results found for this basename: NA:2,K:2,CL:2,CO2:2,BUN:2,CREATININE:2,GLUCOSE:2,CALCIUM:2 in the last 72 hours No results found for this basename: LABPT:2,INR:2 in the last 72 hours  Neurovascular intact Incision: dressing C/D/I  Assessment/Plan: Discharge to home OV one week with Dr Tomasa Hose and robaxin rx  COD stable   Lawrencia Mauney M 09/02/2011, 12:12 PM

## 2011-09-02 NOTE — Plan of Care (Signed)
Problem: Consults Goal: Diagnosis - Spinal Surgery Outcome: Completed/Met Date Met:  09/02/11 Cervical Spine Fusion

## 2011-09-02 NOTE — Plan of Care (Signed)
Problem: Consults Goal: Diagnosis - Spinal Surgery Cervical Spine Fusion     

## 2011-09-02 NOTE — Progress Notes (Signed)
Utilization review completed. Almin Livingstone, RN, BSN. 09/02/11 

## 2011-09-03 ENCOUNTER — Encounter (HOSPITAL_COMMUNITY): Payer: Self-pay | Admitting: Orthopaedic Surgery

## 2011-10-18 ENCOUNTER — Encounter (HOSPITAL_COMMUNITY): Payer: Self-pay

## 2012-11-16 ENCOUNTER — Other Ambulatory Visit: Payer: Self-pay | Admitting: Family Medicine

## 2012-11-16 DIAGNOSIS — M545 Low back pain: Secondary | ICD-10-CM

## 2012-11-16 DIAGNOSIS — M79604 Pain in right leg: Secondary | ICD-10-CM

## 2012-11-16 DIAGNOSIS — M25552 Pain in left hip: Secondary | ICD-10-CM

## 2012-11-20 ENCOUNTER — Ambulatory Visit
Admission: RE | Admit: 2012-11-20 | Discharge: 2012-11-20 | Disposition: A | Discharge status: Home / Self Care | Payer: 59 | Source: Ambulatory Visit | Attending: Family Medicine | Admitting: Family Medicine

## 2012-11-20 DIAGNOSIS — M79605 Pain in left leg: Secondary | ICD-10-CM

## 2012-11-20 DIAGNOSIS — M79604 Pain in right leg: Secondary | ICD-10-CM

## 2012-11-20 DIAGNOSIS — M25552 Pain in left hip: Secondary | ICD-10-CM

## 2012-11-20 DIAGNOSIS — M545 Low back pain, unspecified: Secondary | ICD-10-CM

## 2012-11-20 DIAGNOSIS — M25551 Pain in right hip: Secondary | ICD-10-CM

## 2013-02-24 ENCOUNTER — Other Ambulatory Visit: Payer: Self-pay | Admitting: Neurology

## 2013-02-24 DIAGNOSIS — M549 Dorsalgia, unspecified: Secondary | ICD-10-CM

## 2013-02-24 DIAGNOSIS — R29898 Other symptoms and signs involving the musculoskeletal system: Secondary | ICD-10-CM

## 2013-03-12 ENCOUNTER — Ambulatory Visit
Admission: RE | Admit: 2013-03-12 | Discharge: 2013-03-12 | Disposition: A | Discharge status: Home / Self Care | Payer: 59 | Source: Ambulatory Visit | Attending: Neurology | Admitting: Neurology

## 2013-03-12 DIAGNOSIS — R29898 Other symptoms and signs involving the musculoskeletal system: Secondary | ICD-10-CM

## 2013-03-12 DIAGNOSIS — M549 Dorsalgia, unspecified: Secondary | ICD-10-CM

## 2013-04-07 ENCOUNTER — Other Ambulatory Visit: Payer: Self-pay | Admitting: Family Medicine

## 2013-04-16 ENCOUNTER — Ambulatory Visit
Admission: RE | Admit: 2013-04-16 | Discharge: 2013-04-16 | Disposition: A | Discharge status: Home / Self Care | Payer: 59 | Source: Ambulatory Visit | Attending: Family Medicine | Admitting: Family Medicine

## 2015-02-17 ENCOUNTER — Emergency Department
Admission: EM | Admit: 2015-02-17 | Discharge: 2015-02-17 | Disposition: A | Discharge status: Home / Self Care | Payer: 59 | Attending: Emergency Medicine | Admitting: Emergency Medicine

## 2015-02-17 ENCOUNTER — Emergency Department: Payer: 59

## 2015-02-17 ENCOUNTER — Other Ambulatory Visit: Payer: Self-pay

## 2015-02-17 ENCOUNTER — Encounter: Payer: Self-pay | Admitting: *Deleted

## 2015-02-17 DIAGNOSIS — R1010 Upper abdominal pain, unspecified: Secondary | ICD-10-CM | POA: Diagnosis not present

## 2015-02-17 DIAGNOSIS — E119 Type 2 diabetes mellitus without complications: Secondary | ICD-10-CM | POA: Diagnosis not present

## 2015-02-17 DIAGNOSIS — Z720 Tobacco use: Secondary | ICD-10-CM | POA: Insufficient documentation

## 2015-02-17 DIAGNOSIS — R0789 Other chest pain: Secondary | ICD-10-CM | POA: Diagnosis not present

## 2015-02-17 DIAGNOSIS — R55 Syncope and collapse: Secondary | ICD-10-CM | POA: Diagnosis not present

## 2015-02-17 DIAGNOSIS — R079 Chest pain, unspecified: Secondary | ICD-10-CM

## 2015-02-17 DIAGNOSIS — Z79899 Other long term (current) drug therapy: Secondary | ICD-10-CM | POA: Diagnosis not present

## 2015-02-17 LAB — TROPONIN I
Troponin I: <0.03 ng/mL (ref <0.031)
Troponin I: <0.03 ng/mL (ref <0.031)

## 2015-02-17 LAB — BASIC METABOLIC PANEL
Anion gap: 9 (ref 5–15)
BUN: 25 mg/dL — AB (ref 6–20)
CHLORIDE: 96 mmol/L — AB (ref 101–111)
CO2: 28 mmol/L (ref 22–32)
Calcium: 9.1 mg/dL (ref 8.9–10.3)
Creatinine, Ser: 1.31 mg/dL — ABNORMAL HIGH (ref 0.61–1.24)
GFR calc Af Amer: >60 mL/min (ref >60)
GFR calc non Af Amer: >60 mL/min (ref >60)
GLUCOSE: 458 mg/dL — AB (ref 65–99)
POTASSIUM: 4.4 mmol/L (ref 3.5–5.1)
Sodium: 133 mmol/L — ABNORMAL LOW (ref 135–145)

## 2015-02-17 LAB — HEPATIC FUNCTION PANEL
ALT: 15 U/L — AB (ref 17–63)
AST: 21 U/L (ref 15–41)
Albumin: 3.9 g/dL (ref 3.5–5.0)
Alkaline Phosphatase: 124 U/L (ref 38–126)
TOTAL PROTEIN: 6.9 g/dL (ref 6.5–8.1)
Total Bilirubin: 0.6 mg/dL (ref 0.3–1.2)

## 2015-02-17 LAB — URINALYSIS COMPLETE WITH MICROSCOPIC (ARMC ONLY)
Bacteria, UA: NONE SEEN
Bilirubin Urine: NEGATIVE
Glucose, UA: >500 mg/dL — AB
Hgb urine dipstick: NEGATIVE
KETONES UR: NEGATIVE mg/dL
Leukocytes, UA: NEGATIVE
NITRITE: NEGATIVE
PH: 6 (ref 5.0–8.0)
PROTEIN: NEGATIVE mg/dL
Specific Gravity, Urine: 1.025 (ref 1.005–1.030)
Squamous Epithelial / LPF: NONE SEEN

## 2015-02-17 LAB — GLUCOSE, CAPILLARY: Glucose-Capillary: 303 mg/dL — ABNORMAL HIGH (ref 65–99)

## 2015-02-17 LAB — LIPASE, BLOOD: Lipase: 36 U/L (ref 22–51)

## 2015-02-17 MED ORDER — ONDANSETRON HCL 4 MG/2ML IJ SOLN
4.0000 mg | Freq: Once | INTRAMUSCULAR | Status: AC
  Administered 2015-02-17: 4 mg via INTRAVENOUS
  Filled 2015-02-17: qty 2

## 2015-02-17 MED ORDER — SODIUM CHLORIDE 0.9 % IV BOLUS (SEPSIS)
1000.0000 mL | Freq: Once | INTRAVENOUS | Status: AC
  Administered 2015-02-17: 1000 mL via INTRAVENOUS

## 2015-02-17 MED ORDER — INSULIN ASPART 100 UNIT/ML ~~LOC~~ SOLN
5.0000 [IU] | Freq: Once | SUBCUTANEOUS | Status: AC
  Administered 2015-02-17: 5 [IU] via INTRAVENOUS
  Filled 2015-02-17: qty 5

## 2015-02-17 MED ORDER — IOHEXOL 240 MG/ML SOLN
25.0000 mL | Freq: Once | INTRAMUSCULAR | Status: AC | PRN
  Administered 2015-02-17: 25 mL via ORAL

## 2015-02-17 MED ORDER — METOCLOPRAMIDE HCL 10 MG PO TABS: 10.0000 mg | ORAL_TABLET | Freq: Four times a day (QID) | ORAL | Status: DC | PRN

## 2015-02-17 MED ORDER — MORPHINE SULFATE 4 MG/ML IJ SOLN
4.0000 mg | Freq: Once | INTRAMUSCULAR | Status: AC
  Administered 2015-02-17: 4 mg via INTRAVENOUS
  Filled 2015-02-17: qty 1

## 2015-02-17 MED ORDER — IOHEXOL 300 MG/ML  SOLN
100.0000 mL | Freq: Once | INTRAMUSCULAR | Status: AC | PRN
  Administered 2015-02-17: 100 mL via INTRAVENOUS

## 2015-02-17 MED ORDER — RANITIDINE HCL 75 MG PO TABS: 75.0000 mg | ORAL_TABLET | Freq: Two times a day (BID) | ORAL | Status: DC

## 2015-02-17 NOTE — ED Notes (Signed)
Pt c/o midsternal chest pressure for the past 2-3 weeks getting worse tonight. Pt states he feels like "gas is building up in his chest". Pt also states pain radiates into his back, neck, shoulders, and arms at times. Pt was at gas station tonight when he felt like he was going to pass out and that prompted him to come to the ED.

## 2015-02-17 NOTE — Discharge Instructions (Signed)

## 2015-02-17 NOTE — ED Provider Notes (Addendum)
[facility] Emergency Department Provider Note  ____________________________________________  Time seen: Approximately 1250 AM  I have reviewed the triage vital signs and the nursing notes.   HISTORY  Chief Complaint Chest Pain    HPI Nathaniel Colon is a 54 y.o. male with a history of hiatal hernia presents with midsternal chest pain for the past 2 weeks. Patient says that the pain is pressure-like and feels like gas. He says that he has been belching a lot which sometimes relieves the pain. His pain was worse tonight with an episode of near syncope. He said he became lightheaded and had to be lowered to the ground. No vomiting but he says that he does have nausea and it hurts to eat. Has never had surgery for his hiatal hernia. Says he either has gallstones or kidney stones but unsure which. No point or shortness of breath or diaphoresis. No diarrhea but does have increased flatulence. Pain radiating to his right back as well as shoulders and bilateral arms at times. Pain is a 5 out of 10 at this time but becomes severe for several minutes at times. Nonexertional.    Past Medical History  Diagnosis Date  . Peripheral vascular disease     PERIPHERAL NEUROPATHY   . Diabetes mellitus   . GERD (gastroesophageal reflux disease)   . H/O hiatal hernia   . Neuromuscular disorder     MUSCLE SPASMS    Patient Active Problem List   Diagnosis Date Noted  . Stenosis, cervical spine 08/30/2011    Class: Diagnosis of  . HNP (herniated nucleus pulposus), cervical 08/30/2011    Class: Diagnosis of  . Right lateral epicondylitis 08/30/2011    Class: Diagnosis of    Past Surgical History  Procedure Laterality Date  . No past surgeries    . Anterior cervical decomp/discectomy fusion  08/30/2011    Procedure: ANTERIOR CERVICAL DECOMPRESSION/DISCECTOMY FUSION 2 LEVELS;  Surgeon: Eldred Manges, MD;  Location: [facility] OR;  Service: Orthopedics;  Laterality: N/A;  C5-6, C6-7  Anterior Cervical Discectomy and Fusion, Allograft, Plate  . Lateral epicondyle release  08/30/2011    Procedure: TENNIS ELBOW RELEASE;  Surgeon: Eldred Manges, MD;  Location: [facility] OR;  Service: Orthopedics;  Laterality: Right;  Right Lateral Epicondyle Release, Drilling Repair    Current Outpatient Rx  Name  Route  Sig  Dispense  Refill  . glimepiride (AMARYL) 4 MG tablet   Oral   Take 4 mg by mouth 2 (two) times daily.           . metFORMIN (GLUCOPHAGE) 1000 MG tablet   Oral   Take 1,000 mg by mouth 2 (two) times daily with a meal.           . pioglitazone (ACTOS) 45 MG tablet   Oral   Take 45 mg by mouth daily.           . pravastatin (PRAVACHOL) 40 MG tablet   Oral   Take 40 mg by mouth 2 (two) times daily.             Allergies Review of patient's allergies indicates no known allergies.  No family history on file.  Social History History  Substance Use Topics  . Smoking status: Current Every Day Smoker -- 0.50 packs/day    Types: Cigarettes  . Smokeless tobacco: Current User  . Alcohol Use: Yes    Review of Systems Constitutional: No fever/chills Eyes: No visual changes. ENT: No sore throat.  Cardiovascular: As above Respiratory: Denies shortness of breath. Gastrointestinal:  No diarrhea.  No constipation. Genitourinary: Negative for dysuria. Musculoskeletal: Negative for back pain. Skin: Negative for rash. Neurological: Negative for headaches, focal weakness or numbness.  10-point ROS otherwise negative.  ____________________________________________   PHYSICAL EXAM:  VITAL SIGNS: ED Triage Vitals  Enc Vitals Group     BP 02/17/15 0042 131/80 mmHg     Pulse Rate 02/17/15 0042 88     Resp 02/17/15 0042 16     Temp 02/17/15 0042 98.1 F (36.7 C)     Temp Source 02/17/15 0042 Oral     SpO2 02/17/15 0042 95 %     Weight --      Height --      Head Cir --      Peak Flow --      Pain Score 02/17/15 0032 6     Pain Loc --      Pain Edu? --       Excl. in GC? --     Constitutional: Alert and oriented. Well appearing and in no acute distress. Eyes: Conjunctivae are normal. PERRL. EOMI. Head: Atraumatic. Nose: No congestion/rhinnorhea. Mouth/Throat: Mucous membranes are moist.  Oropharynx non-erythematous. Neck: No stridor.   Cardiovascular: Normal rate, regular rhythm. Grossly normal heart sounds.  Good peripheral circulation. Respiratory: Normal respiratory effort.  No retractions. Lungs CTAB. Gastrointestinal: Soft with diffuse tenderness to palpation which is worse in the right upper, epigastric and left upper quadrants. There is no rebound or guarding.. No distention. No abdominal bruits. No CVA tenderness. Musculoskeletal: No lower extremity tenderness nor edema.  No joint effusions. Neurologic:  Normal speech and language. No gross focal neurologic deficits are appreciated. Speech is normal. No gait instability. Skin:  Skin is warm, dry and intact. No rash noted. Psychiatric: Mood and affect are normal. Speech and behavior are normal.  ____________________________________________   LABS (all labs ordered are listed, but only abnormal results are displayed)  Labs Reviewed  BASIC METABOLIC PANEL - Abnormal; Notable for the following:    Sodium 133 (*)    Chloride 96 (*)    Glucose, Bld 458 (*)    BUN 25 (*)    Creatinine, Ser 1.31 (*)    All other components within normal limits  HEPATIC FUNCTION PANEL - Abnormal; Notable for the following:    ALT 15 (*)    Bilirubin, Direct <0.1 (*)    All other components within normal limits  URINALYSIS COMPLETEWITH MICROSCOPIC ([facility] ONLY) - Abnormal; Notable for the following:    Color, Urine COLORLESS (*)    APPearance CLEAR (*)    Glucose, UA >500 (*)    All other components within normal limits  GLUCOSE, CAPILLARY - Abnormal; Notable for the following:    Glucose-Capillary 303 (*)    All other components within normal limits  TROPONIN I  LIPASE, BLOOD  TROPONIN I   CBG MONITORING, ED   ____________________________________________  EKG  ED ECG REPORT I, Arelia Longest, the attending physician, personally viewed and interpreted this ECG.   Date: 02/17/2015  EKG Time: 0 31  Rate: 84  Rhythm: normal sinus rhythm  Axis: Normal axis  Intervals:none  ST&T Change: No ST elevations or depressions. T-wave inversion in aVL and V2 which is unchanged from 08/26/2011  ____________________________________________  RADIOLOGY  Chest x-ray without any acute disease. I personally reviewed these images No acute abnormality on abdomen and pelvis scan. Once under meters stone in the kidney on  the left as well as 2.4 cinnamon or left adrenal adenoma ____________________________________________   PROCEDURES    ____________________________________________   INITIAL IMPRESSION / ASSESSMENT AND PLAN / ED COURSE  Pertinent labs & imaging results that were available during my care of the patient were reviewed by me and considered in my medical decision making (see chart for details).  ----------------------------------------- 4:25 AM on 02/17/2015 -----------------------------------------  Patient resting comfortably at this time. Glucose reduced to 303 with fluids and insulin. Pain is relieved. We'll discharge to home. Given copy of CAT scan report and counseled to take with him to his follow-up with his primary care doctor. I think that the patient needs follow-up with a cardiologist but he says that he thinks as long as he couldn't control the gas that he does need to follow-up. I discussed that he should have further testing to rule out cardiac disease and we'll give him the contact information for cardiology. He says that he will call as needed and understands his risks. I think that the presentation seems primarily gastrointestinal related. Possibly gastroparesis. We'll discharge with Reglan and also an  antacid. ____________________________________________   FINAL CLINICAL IMPRESSION(S) / ED DIAGNOSES  Acute abdominal pain and acute chest pain acute near-syncope. Initial visit.    Myrna Blazeravid Matthew Fabion Gatson, MD 02/17/15 [phone]  Patient aware of adenoma and need to follow-up with primary care doctor.  Myrna Blazeravid Matthew Jorrell Kuster, MD 02/17/15 [phone]

## 2015-02-17 NOTE — ED Notes (Signed)
Patient with no complaints at this time. Respirations even and unlabored. Skin warm/dry. Discharge instructions reviewed with patient at this time. Patient given opportunity to voice concerns/ask questions. IV removed per policy and band-aid applied to site. Patient discharged at this time and left Emergency Department with steady gait.  

## 2016-03-15 ENCOUNTER — Emergency Department: Payer: BLUE CROSS/BLUE SHIELD

## 2016-03-15 ENCOUNTER — Observation Stay
Admission: EM | Admit: 2016-03-15 | Discharge: 2016-03-16 | Disposition: A | Discharge status: Home / Self Care | Payer: BLUE CROSS/BLUE SHIELD | Attending: Internal Medicine | Admitting: Internal Medicine

## 2016-03-15 DIAGNOSIS — I2510 Atherosclerotic heart disease of native coronary artery without angina pectoris: Secondary | ICD-10-CM | POA: Insufficient documentation

## 2016-03-15 DIAGNOSIS — F17210 Nicotine dependence, cigarettes, uncomplicated: Secondary | ICD-10-CM | POA: Insufficient documentation

## 2016-03-15 DIAGNOSIS — E785 Hyperlipidemia, unspecified: Secondary | ICD-10-CM | POA: Insufficient documentation

## 2016-03-15 DIAGNOSIS — Z7982 Long term (current) use of aspirin: Secondary | ICD-10-CM | POA: Diagnosis not present

## 2016-03-15 DIAGNOSIS — Z981 Arthrodesis status: Secondary | ICD-10-CM | POA: Diagnosis not present

## 2016-03-15 DIAGNOSIS — M62838 Other muscle spasm: Secondary | ICD-10-CM | POA: Diagnosis not present

## 2016-03-15 DIAGNOSIS — K449 Diaphragmatic hernia without obstruction or gangrene: Secondary | ICD-10-CM | POA: Diagnosis not present

## 2016-03-15 DIAGNOSIS — R1310 Dysphagia, unspecified: Secondary | ICD-10-CM | POA: Diagnosis not present

## 2016-03-15 DIAGNOSIS — I10 Essential (primary) hypertension: Secondary | ICD-10-CM | POA: Insufficient documentation

## 2016-03-15 DIAGNOSIS — I739 Peripheral vascular disease, unspecified: Secondary | ICD-10-CM | POA: Insufficient documentation

## 2016-03-15 DIAGNOSIS — M4802 Spinal stenosis, cervical region: Secondary | ICD-10-CM | POA: Insufficient documentation

## 2016-03-15 DIAGNOSIS — Z79899 Other long term (current) drug therapy: Secondary | ICD-10-CM | POA: Diagnosis not present

## 2016-03-15 DIAGNOSIS — E1140 Type 2 diabetes mellitus with diabetic neuropathy, unspecified: Secondary | ICD-10-CM | POA: Insufficient documentation

## 2016-03-15 DIAGNOSIS — R0789 Other chest pain: Secondary | ICD-10-CM | POA: Diagnosis not present

## 2016-03-15 DIAGNOSIS — Z794 Long term (current) use of insulin: Secondary | ICD-10-CM | POA: Diagnosis not present

## 2016-03-15 DIAGNOSIS — R079 Chest pain, unspecified: Secondary | ICD-10-CM | POA: Diagnosis present

## 2016-03-15 DIAGNOSIS — K219 Gastro-esophageal reflux disease without esophagitis: Secondary | ICD-10-CM | POA: Diagnosis not present

## 2016-03-15 LAB — BASIC METABOLIC PANEL
Anion gap: 9 (ref 5–15)
BUN: 23 mg/dL — AB (ref 6–20)
CHLORIDE: 103 mmol/L (ref 101–111)
CO2: 25 mmol/L (ref 22–32)
CREATININE: 1.16 mg/dL (ref 0.61–1.24)
Calcium: 9 mg/dL (ref 8.9–10.3)
GFR calc Af Amer: >60 mL/min (ref >60)
GFR calc non Af Amer: >60 mL/min (ref >60)
GLUCOSE: 360 mg/dL — AB (ref 65–99)
POTASSIUM: 4.4 mmol/L (ref 3.5–5.1)
Sodium: 137 mmol/L (ref 135–145)

## 2016-03-15 LAB — CBC
HEMATOCRIT: 50.8 % (ref 40.0–52.0)
Hemoglobin: 17.1 g/dL (ref 13.0–18.0)
MCH: 31.1 pg (ref 26.0–34.0)
MCHC: 33.7 g/dL (ref 32.0–36.0)
MCV: 92.2 fL (ref 80.0–100.0)
Platelets: 169 10*3/uL (ref 150–440)
RBC: 5.51 MIL/uL (ref 4.40–5.90)
RDW: 13.6 % (ref 11.5–14.5)
WBC: 7.9 10*3/uL (ref 3.8–10.6)

## 2016-03-15 LAB — GLUCOSE, CAPILLARY: GLUCOSE-CAPILLARY: 291 mg/dL — AB (ref 65–99)

## 2016-03-15 LAB — TROPONIN I: Troponin I: <0.03 ng/mL (ref <0.03)

## 2016-03-15 MED ORDER — SODIUM CHLORIDE 0.9% FLUSH: 3.0000 mL | INTRAVENOUS | Status: DC | PRN

## 2016-03-15 MED ORDER — ONDANSETRON HCL 4 MG/2ML IJ SOLN: 4.0000 mg | Freq: Four times a day (QID) | INTRAMUSCULAR | Status: DC | PRN

## 2016-03-15 MED ORDER — SODIUM CHLORIDE 0.9% FLUSH
3.0000 mL | Freq: Two times a day (BID) | INTRAVENOUS | Status: DC
  Administered 2016-03-15: 3 mL via INTRAVENOUS

## 2016-03-15 MED ORDER — INSULIN ASPART PROT & ASPART (70-30 MIX) 100 UNIT/ML ~~LOC~~ SUSP
12.0000 [IU] | Freq: Two times a day (BID) | SUBCUTANEOUS | Status: DC
  Administered 2016-03-16: 12 [IU] via SUBCUTANEOUS
  Filled 2016-03-15: qty 12

## 2016-03-15 MED ORDER — ACETAMINOPHEN 650 MG RE SUPP: 650.0000 mg | Freq: Four times a day (QID) | RECTAL | Status: DC | PRN

## 2016-03-15 MED ORDER — ACETAMINOPHEN 325 MG PO TABS: 650.0000 mg | ORAL_TABLET | Freq: Four times a day (QID) | ORAL | Status: DC | PRN

## 2016-03-15 MED ORDER — INSULIN ASPART 100 UNIT/ML ~~LOC~~ SOLN
0.0000 [IU] | Freq: Three times a day (TID) | SUBCUTANEOUS | Status: DC
  Administered 2016-03-16: 2 [IU] via SUBCUTANEOUS
  Filled 2016-03-15: qty 2

## 2016-03-15 MED ORDER — INSULIN ASPART 100 UNIT/ML ~~LOC~~ SOLN
0.0000 [IU] | Freq: Every day | SUBCUTANEOUS | Status: DC
Start: 2016-03-15 — End: 2016-03-16
  Administered 2016-03-15: 3 [IU] via SUBCUTANEOUS
  Filled 2016-03-15: qty 3

## 2016-03-15 MED ORDER — ALBUTEROL SULFATE (2.5 MG/3ML) 0.083% IN NEBU: 2.5000 mg | INHALATION_SOLUTION | RESPIRATORY_TRACT | Status: DC | PRN

## 2016-03-15 MED ORDER — SODIUM CHLORIDE 0.9 % IV BOLUS (SEPSIS)
1000.0000 mL | Freq: Once | INTRAVENOUS | Status: AC
  Administered 2016-03-15: 1000 mL via INTRAVENOUS

## 2016-03-15 MED ORDER — ASPIRIN EC 81 MG PO TBEC
81.0000 mg | DELAYED_RELEASE_TABLET | Freq: Every day | ORAL | Status: DC
  Administered 2016-03-15 – 2016-03-16 (×2): 81 mg via ORAL
  Filled 2016-03-15: qty 1

## 2016-03-15 MED ORDER — ONDANSETRON HCL 4 MG PO TABS: 4.0000 mg | ORAL_TABLET | Freq: Four times a day (QID) | ORAL | Status: DC | PRN

## 2016-03-15 MED ORDER — ENOXAPARIN SODIUM 40 MG/0.4ML ~~LOC~~ SOLN
40.0000 mg | SUBCUTANEOUS | Status: DC
  Administered 2016-03-15: 40 mg via SUBCUTANEOUS
  Filled 2016-03-15: qty 0.4

## 2016-03-15 MED ORDER — ASPIRIN EC 81 MG PO TBEC
DELAYED_RELEASE_TABLET | ORAL | Status: AC
  Administered 2016-03-15: 81 mg via ORAL
  Filled 2016-03-15: qty 1

## 2016-03-15 MED ORDER — SODIUM CHLORIDE 0.9 % IV SOLN: 250.0000 mL | INTRAVENOUS | Status: DC | PRN

## 2016-03-15 NOTE — ED Notes (Signed)
Pt provided meal tray and crackers prior to transport to floor. Pt in NAD at this time and stable per vitals and appearance.

## 2016-03-15 NOTE — ED Provider Notes (Signed)
[facility] Emergency Department Provider Note  Time seen: 1:36 PM  I have reviewed the triage vital signs and the nursing notes.   HISTORY  Chief Complaint Chest Pain    HPI Nathaniel Colon is a 55 y.o. male with a past medical history of diabetes, gastric reflux, who presents the emergency department with chest discomfort. According to the patient approximately 1 hour prior to arrival he was working on a trailer outside when he suddenly felt chest tightness, felt very hot and clammy like he is going to pass out. The patient states he sat down and his symptoms seemed to resolve, however he attempted to stand back up and the symptoms recurred to set back down again and EMS was called to bring him to the hospital. Patient denies any history of heart disease or heart attacks. States he quit smoking many years ago, seldomly drinks alcohol. Denies any chest discomfort currently. States he feels normal at this time. Denies any recent leg pain or swelling, abdominal pain nausea, vomiting or diarrhea. Denies any shortness of breath with his symptoms today.  Past Medical History:  Diagnosis Date  . Diabetes mellitus   . GERD (gastroesophageal reflux disease)   . H/O hiatal hernia   . Neuromuscular disorder (HCC)    MUSCLE SPASMS  . Peripheral vascular disease (HCC)    PERIPHERAL NEUROPATHY     Patient Active Problem List   Diagnosis Date Noted  . Stenosis, cervical spine 08/30/2011    Class: Diagnosis of  . HNP (herniated nucleus pulposus), cervical 08/30/2011    Class: Diagnosis of  . Right lateral epicondylitis 08/30/2011    Class: Diagnosis of    Past Surgical History:  Procedure Laterality Date  . ANTERIOR CERVICAL DECOMP/DISCECTOMY FUSION  08/30/2011   Procedure: ANTERIOR CERVICAL DECOMPRESSION/DISCECTOMY FUSION 2 LEVELS;  Surgeon: Eldred Manges, MD;  Location: [facility] OR;  Service: Orthopedics;  Laterality: N/A;  C5-6, C6-7 Anterior Cervical Discectomy and  Fusion, Allograft, Plate  . LATERAL EPICONDYLE RELEASE  08/30/2011   Procedure: TENNIS ELBOW RELEASE;  Surgeon: Eldred Manges, MD;  Location: [facility] OR;  Service: Orthopedics;  Laterality: Right;  Right Lateral Epicondyle Release, Drilling Repair  . NO PAST SURGERIES      Prior to Admission medications   Medication Sig Start Date End Date Taking? Authorizing Provider  glimepiride (AMARYL) 4 MG tablet Take 4 mg by mouth 2 (two) times daily.      Historical Provider, MD  metFORMIN (GLUCOPHAGE) 1000 MG tablet Take 1,000 mg by mouth 2 (two) times daily with a meal.      Historical Provider, MD  metoCLOPramide (REGLAN) 10 MG tablet Take 1 tablet (10 mg total) by mouth every 6 (six) hours as needed for nausea or vomiting (abdominal pain). 02/17/15   Myrna Blazer, MD  pioglitazone (ACTOS) 45 MG tablet Take 45 mg by mouth daily.      Historical Provider, MD  pravastatin (PRAVACHOL) 40 MG tablet Take 40 mg by mouth 2 (two) times daily.      Historical Provider, MD  ranitidine (ZANTAC) 75 MG tablet Take 1 tablet (75 mg total) by mouth 2 (two) times daily. 02/17/15   Myrna Blazer, MD    No Known Allergies  No family history on file.  Social History Social History  Substance Use Topics  . Smoking status: Current Every Day Smoker    Packs/day: 0.50    Types: Cigarettes  . Smokeless tobacco: Current User  . Alcohol use  Yes    Review of Systems Constitutional: Negative for fever Cardiovascular:Positive for chest tightness, now resolved. Respiratory: Negative for shortness of breath. Gastrointestinal: Negative for abdominal pain Musculoskeletal: Negative for back pain. Neurological: Negative for headaches, focal weakness or numbness. 10-point ROS otherwise negative.  ____________________________________________   PHYSICAL EXAM:  VITAL SIGNS: ED Triage Vitals  Enc Vitals Group     BP 03/15/16 1253 (!) 149/86     Pulse Rate 03/15/16 1253 67     Resp 03/15/16 1253 20      Temp 03/15/16 1253 98.2 F (36.8 C)     Temp Source 03/15/16 1253 Oral     SpO2 03/15/16 1253 96 %     Weight 03/15/16 1254 160 lb (72.6 kg)     Height 03/15/16 1254 6\' 2"  (1.88 m)     Head Circumference --      Peak Flow --      Pain Score 03/15/16 1250 7     Pain Loc --      Pain Edu? --      Excl. in GC? --     Constitutional: Alert and oriented. Well appearing and in no distress. Eyes: Normal exam ENT   Head: Normocephalic and atraumatic   Mouth/Throat: Mucous membranes are moist. Cardiovascular: Normal rate, regular rhythm. No murmur Respiratory: Normal respiratory effort without tachypnea nor retractions. Breath sounds are clear  Gastrointestinal: Soft and nontender. No distention. Musculoskeletal: Nontender with normal range of motion in all extremities. No lower extremity tenderness or edema. Neurologic:  Normal speech and language. No gross focal neurologic deficits are appreciated. Skin:  Skin is warm, dry and intact.  Psychiatric: Mood and affect are normal. Speech and behavior are normal.   ____________________________________________    EKG  EKG reviewed and interpreted by myself shows normal sinus rhythm at 72 bpm, narrow QRS, normal axis, normal intervals, no concerning ST changes.  ____________________________________________    RADIOLOGY  Chest x-ray negative  ____________________________________________   INITIAL IMPRESSION / ASSESSMENT AND PLAN / ED COURSE  Pertinent labs & imaging results that were available during my care of the patient were reviewed by me and considered in my medical decision making (see chart for details).  The patient presents the emergency department with chest discomfort starting 1 hour prior to arrival. Patient denies any discomfort currently. Overall appears well, no complaints at this time. We will check labs. If labs are within normal limits we'll plan to repeat a troponin at 3 hours.  Labs are within normal  limits. Troponin is negative. Repeat troponin pending. Patient care signed out to Dr. Roxan Hockey.  ____________________________________________   FINAL CLINICAL IMPRESSION(S) / ED DIAGNOSES  Chest pain    Minna Antis, MD 03/15/16 1447

## 2016-03-15 NOTE — Progress Notes (Signed)
Pt. Signed consent for Myoview scheduled for 03/16/2016. Consent placed on chart. Education given to pt. About Myoview.

## 2016-03-15 NOTE — H&P (Signed)
[facility] - [place] at [facility]   PATIENT NAME: Nathaniel Colon    MR#:  [id number]  DATE OF BIRTH:  15-Aug-1960  DATE OF ADMISSION:  03/15/2016  PRIMARY CARE PHYSICIAN: Malvin Johns, PTA (Inactive)   REQUESTING/REFERRING PHYSICIAN: Dr. Roxan Hockey  CHIEF COMPLAINT:   Chief Complaint  Patient presents with  . Chest Pain    HISTORY OF PRESENT ILLNESS:  Nathaniel Colon  is a 55 y.o. male with a known history of Diabetes, GERD presents to the emergency room complaining of 3 episodes of chest pain earlier today. Patient was ambulating at work and felt midsternal chest pain associated with sweating. This lasted about 30 minutes and resolved on resting. Again when patient started walking at this pain again. Happened 3 times and presented to the emergency room. He has noticed mild similar symptoms mainly when he eats associated with dysphagia recently. Not exertional. Upon and has trended from 0.03-0.04 in the normal range. EKG shows nothing acute.  Patient is being admitted for a stress test tomorrow morning.  PAST MEDICAL HISTORY:   Past Medical History:  Diagnosis Date  . Diabetes mellitus   . GERD (gastroesophageal reflux disease)   . H/O hiatal hernia   . Neuromuscular disorder (HCC)    MUSCLE SPASMS  . Peripheral vascular disease (HCC)    PERIPHERAL NEUROPATHY     PAST SURGICAL HISTORY:   Past Surgical History:  Procedure Laterality Date  . ANTERIOR CERVICAL DECOMP/DISCECTOMY FUSION  08/30/2011   Procedure: ANTERIOR CERVICAL DECOMPRESSION/DISCECTOMY FUSION 2 LEVELS;  Surgeon: Eldred Manges, MD;  Location: [facility] OR;  Service: Orthopedics;  Laterality: N/A;  C5-6, C6-7 Anterior Cervical Discectomy and Fusion, Allograft, Plate  . LATERAL EPICONDYLE RELEASE  08/30/2011   Procedure: TENNIS ELBOW RELEASE;  Surgeon: Eldred Manges, MD;  Location: [facility] OR;  Service: Orthopedics;  Laterality: Right;  Right Lateral Epicondyle Release, Drilling Repair  . NO PAST SURGERIES       SOCIAL HISTORY:   Social History  Substance Use Topics  . Smoking status: Current Every Day Smoker    Packs/day: 0.50    Types: Cigarettes  . Smokeless tobacco: Current User  . Alcohol use Yes    FAMILY HISTORY:  No family history on file.  Family history of irregular heart rate and CAD in his father. No premature CAD in the family.  DRUG ALLERGIES:  No Known Allergies  REVIEW OF SYSTEMS:   Review of Systems  Constitutional: Positive for malaise/fatigue. Negative for chills, fever and weight loss.  HENT: Negative for hearing loss and nosebleeds.   Eyes: Negative for blurred vision, double vision and pain.  Respiratory: Negative for cough, hemoptysis, sputum production, shortness of breath and wheezing.   Cardiovascular: Positive for chest pain. Negative for palpitations, orthopnea and leg swelling.  Gastrointestinal: Negative for abdominal pain, constipation, diarrhea, nausea and vomiting.  Genitourinary: Negative for dysuria and hematuria.  Musculoskeletal: Negative for back pain, falls and myalgias.  Skin: Negative for rash.  Neurological: Negative for dizziness, tremors, sensory change, speech change, focal weakness, seizures and headaches.  Endo/Heme/Allergies: Does not bruise/bleed easily.  Psychiatric/Behavioral: Negative for depression and memory loss. The patient is not nervous/anxious.     MEDICATIONS AT HOME:   Prior to Admission medications   Medication Sig Start Date End Date Taking? Authorizing Provider  insulin NPH-regular Human (NOVOLIN 70/30) (70-30) 100 UNIT/ML injection Inject 18-20 Units into the skin 2 (two) times daily with a meal.   Yes Historical Provider, MD  VITAL SIGNS:  Blood pressure (!) 131/96, pulse 60, temperature 98.2 F (36.8 C), temperature source Oral, resp. rate 16, height 6\' 2"  (1.88 m), weight 72.6 kg (160 lb), SpO2 97 %.  PHYSICAL EXAMINATION:  Physical Exam  GENERAL:  55 y.o.-year-old patient lying in the bed with no  acute distress.  EYES: Pupils equal, round, reactive to light and accommodation. No scleral icterus. Extraocular muscles intact.  HEENT: Head atraumatic, normocephalic. Oropharynx and nasopharynx clear. No oropharyngeal erythema, moist oral mucosa  NECK:  Supple, no jugular venous distention. No thyroid enlargement, no tenderness.  LUNGS: Normal breath sounds bilaterally, no wheezing, rales, rhonchi. No use of accessory muscles of respiration.  CARDIOVASCULAR: S1, S2 normal. No murmurs, rubs, or gallops.  ABDOMEN: Soft, nontender, nondistended. Bowel sounds present. No organomegaly or mass.  EXTREMITIES: No pedal edema, cyanosis, or clubbing. + 2 pedal & radial pulses b/l.   NEUROLOGIC: Cranial nerves II through XII are intact. No focal Motor or sensory deficits appreciated b/l PSYCHIATRIC: The patient is alert and oriented x 3. Good affect.  SKIN: No obvious rash, lesion, or ulcer.   LABORATORY PANEL:   CBC  Recent Labs Lab 03/15/16 1253  WBC 7.9  HGB 17.1  HCT 50.8  PLT 169   ------------------------------------------------------------------------------------------------------------------  Chemistries   Recent Labs Lab 03/15/16 1253  NA 137  K 4.4  CL 103  CO2 25  GLUCOSE 360*  BUN 23*  CREATININE 1.16  CALCIUM 9.0   ------------------------------------------------------------------------------------------------------------------  Cardiac Enzymes  Recent Labs Lab 03/15/16 1750  TROPONINI 0.04*   ------------------------------------------------------------------------------------------------------------------  RADIOLOGY:  Dg Chest 2 View  Result Date: 03/15/2016 CLINICAL DATA:  Shortness of breath and chest pain EXAM: CHEST  2 VIEW COMPARISON:  02/17/2015 FINDINGS: The heart size and mediastinal contours are within normal limits. Both lungs are clear. The visualized skeletal structures are unremarkable. IMPRESSION: No active cardiopulmonary disease.  Electronically Signed   By: Alcide Clever M.D.   On: 03/15/2016 13:28     IMPRESSION AND PLAN:   * Midsternal chest pain, recurrent This seems to be exertional from what patient is mentioning that has not had any problems with lifting weights earlier. This could be symptoms worsening from his GERD and dysphagia that he recently noticed. Has history of hiatal hernia. We'll admit overnight on telemetry repeat troponin. Get a stress test in the morning. If the stress test is normal patient will need to be discharged home on PPI and get an outpatient EGD. Aspirin.  * Insulin dependent diabetes mellitus Continue his 57 30th lower dose. Sliding scale insulin.  * DVT prophylaxis with Lovenox  All the records are reviewed and case discussed with ED provider. Management plans discussed with the patient, family and they are in agreement.  CODE STATUS: Full code  TOTAL TIME TAKING CARE OF THIS PATIENT: 40 minutes.   Milagros Loll R M.D on 03/15/2016 at 7:04 PM  Between 7am to 6pm - Pager - [phone]  After 6pm go to [email or web address] - password [email or web address]  [facility]  Office  [phone]  CC: Primary care physician; Malvin Johns, PTA (Inactive)  Note: This dictation was prepared with Dragon dictation along with smaller phrase technology. Any transcriptional errors that result from this process are unintentional.

## 2016-03-15 NOTE — ED Triage Notes (Signed)
Pt states he was just getting up to do some work about 1hr PTA and had sudden onset chest pain/pressure with nausea and feeling clammy.Nathaniel Colon

## 2016-03-15 NOTE — Progress Notes (Signed)
Pt. arrived to unit via stretcher. He transferred without staff help. Tele applied, running NS, second verifier by Asyia, CNA. Skin is warm and dry with no issues noted. Second verifier Diannia Ruder, Charity fundraiser. Pt. Is alert and oriented. No acute distress or SOB noted. Sandwich box given, general room orientation given, instruction on how to use ascom. Will continue to monitor pt.

## 2016-03-15 NOTE — Progress Notes (Signed)
Notified pt. CBG was 291, prime paged new order for sliding scale.

## 2016-03-16 ENCOUNTER — Observation Stay: Payer: BLUE CROSS/BLUE SHIELD

## 2016-03-16 LAB — GLUCOSE, CAPILLARY
Glucose-Capillary: 231 mg/dL — ABNORMAL HIGH (ref 65–99)
Glucose-Capillary: 196 mg/dL — ABNORMAL HIGH (ref 65–99)

## 2016-03-16 LAB — TROPONIN I
TROPONIN I: 0.04 ng/mL — AB (ref <0.03)
Troponin I: 0.03 ng/mL (ref <0.03)

## 2016-03-16 MED ORDER — OMEPRAZOLE 20 MG PO CPDR: 20.0000 mg | DELAYED_RELEASE_CAPSULE | Freq: Every day | ORAL | 0 refills | Status: DC

## 2016-03-16 MED ORDER — TECHNETIUM TC 99M TETROFOSMIN IV KIT
29.3200 | PACK | Freq: Once | INTRAVENOUS | Status: AC | PRN
  Administered 2016-03-16: 29.32 via INTRAVENOUS

## 2016-03-16 MED ORDER — TECHNETIUM TC 99M TETROFOSMIN IV KIT
12.9500 | PACK | Freq: Once | INTRAVENOUS | Status: AC | PRN
  Administered 2016-03-16: 12.95 via INTRAVENOUS

## 2016-03-16 MED ORDER — ASPIRIN 81 MG PO TBEC: 81.0000 mg | DELAYED_RELEASE_TABLET | Freq: Every day | ORAL | 0 refills | Status: AC

## 2016-03-16 NOTE — Discharge Summary (Signed)
[facility] [place] [facility]   PATIENT NAME: Nathaniel Colon    MR#:  [id number]  DATE OF BIRTH:  1961-03-04  DATE OF ADMISSION:  03/15/2016 ADMITTING PHYSICIAN: Milagros Loll, MD  DATE OF DISCHARGE: 03/16/16  PRIMARY CARE PHYSICIAN: Malvin Johns, PTA (Inactive)    ADMISSION DIAGNOSIS:  Chest pain [R07.9] Chest pain, unspecified chest pain type [R07.9]  DISCHARGE DIAGNOSIS:  Chest pain ruled out cardiac cause Dm-2  SECONDARY DIAGNOSIS:   Past Medical History:  Diagnosis Date  . Diabetes mellitus   . GERD (gastroesophageal reflux disease)   . H/O hiatal hernia   . Neuromuscular disorder (HCC)    MUSCLE SPASMS  . Peripheral vascular disease (HCC)    PERIPHERAL NEUROPATHY     HOSPITAL COURSE:   * Midsternal chest pain, recurrent This seems to be exertional from what patient is mentioning that has not had any problems with lifting weights earlier. This could be symptoms worsening from his GERD and dysphagia that he recently noticed.  -prilosec -Has history of hiatal hernia. -recommend GI as out pt if symptoms do not improve -cont Aspirin. -seen by dr Gwen Pounds and prelim stress test results negative  * Insulin dependent diabetes mellitus Continue his [age] homedose. Sliding scale insulin.  * DVT prophylaxis with Lovenox  Overall stable D/c home later  CONSULTS OBTAINED:    DRUG ALLERGIES:  No Known Allergies  DISCHARGE MEDICATIONS:   Current Discharge Medication List    START taking these medications   Details  aspirin EC 81 MG EC tablet Take 1 tablet (81 mg total) by mouth daily. Qty: 30 tablet, Refills: 0      CONTINUE these medications which have NOT CHANGED   Details  insulin NPH-regular Human (NOVOLIN 70/30) (70-30) 100 UNIT/ML injection Inject 18-20 Units into the skin 2 (two) times daily with a meal.        If you experience worsening of your admission symptoms, develop shortness of breath, life threatening  emergency, suicidal or homicidal thoughts you must seek medical attention immediately by calling 911 or calling your MD immediately  if symptoms less severe.  You Must read complete instructions/literature along with all the possible adverse reactions/side effects for all the Medicines you take and that have been prescribed to you. Take any new Medicines after you have completely understood and accept all the possible adverse reactions/side effects.   Please note  You were cared for by a hospitalist during your hospital stay. If you have any questions about your discharge medications or the care you received while you were in the hospital after you are discharged, you can call the unit and asked to speak with the hospitalist on call if the hospitalist that took care of you is not available. Once you are discharged, your primary care physician will handle any further medical issues. Please note that NO REFILLS for any discharge medications will be authorized once you are discharged, as it is imperative that you return to your primary care physician (or establish a relationship with a primary care physician if you do not have one) for your aftercare needs so that they can reassess your need for medications and monitor your lab values. Today   SUBJECTIVE   Seen in nuclear med doing well no cp  VITAL SIGNS:  Blood pressure 118/74, pulse (!) 58, temperature 98 F (36.7 C), temperature source Oral, resp. rate 16, height 6\' 2"  (1.88 m), weight 73.9 kg (163 lb), SpO2 98 %.  I/O:  Intake/Output Summary (Last 24 hours) at 03/16/16 1031 Last data filed at 03/16/16 0646  Gross per 24 hour  Intake              480 ml  Output              400 ml  Net               80 ml    PHYSICAL EXAMINATION:  GENERAL:  55 y.o.-year-old patient lying in the bed with no acute distress.  EYES: Pupils equal, round, reactive to light and accommodation. No scleral icterus. Extraocular muscles intact.  HEENT: Head  atraumatic, normocephalic. Oropharynx and nasopharynx clear.  NECK:  Supple, no jugular venous distention. No thyroid enlargement, no tenderness.  LUNGS: Normal breath sounds bilaterally, no wheezing, rales,rhonchi or crepitation. No use of accessory muscles of respiration.  CARDIOVASCULAR: S1, S2 normal. No murmurs, rubs, or gallops.  ABDOMEN: Soft, non-tender, non-distended. Bowel sounds present. No organomegaly or mass.  EXTREMITIES: No pedal edema, cyanosis, or clubbing.  NEUROLOGIC: Cranial nerves II through XII are intact. Muscle strength 5/5 in all extremities. Sensation intact. Gait not checked.  PSYCHIATRIC: The patient is alert and oriented x 3.  SKIN: No obvious rash, lesion, or ulcer.   DATA REVIEW:   CBC   Recent Labs Lab 03/15/16 1253  WBC 7.9  HGB 17.1  HCT 50.8  PLT 169    Chemistries   Recent Labs Lab 03/15/16 1253  NA 137  K 4.4  CL 103  CO2 25  GLUCOSE 360*  BUN 23*  CREATININE 1.16  CALCIUM 9.0    Microbiology Results   No results found for this or any previous visit (from the past 240 hour(s)).  RADIOLOGY:  Dg Chest 2 View  Result Date: 03/15/2016 CLINICAL DATA:  Shortness of breath and chest pain EXAM: CHEST  2 VIEW COMPARISON:  02/17/2015 FINDINGS: The heart size and mediastinal contours are within normal limits. Both lungs are clear. The visualized skeletal structures are unremarkable. IMPRESSION: No active cardiopulmonary disease. Electronically Signed   By: Alcide Clever M.D.   On: 03/15/2016 13:28     Management plans discussed with the patient, family and they are in agreement.  CODE STATUS:     Code Status Orders        Start     Ordered   03/15/16 1902  Full code  Continuous     03/15/16 1902    Code Status History    Date Active Date Inactive Code Status Order ID Comments User Context   08/30/2011  2:28 PM 09/02/2011  3:36 PM Full Code [id number]  Meryl Crutch, RN Inpatient      TOTAL TIME TAKING CARE OF THIS PATIENT:  40 minutes.    Viridiana Spaid M.D on 03/16/2016 at 10:31 AM  Between 7am to 6pm - Pager - [phone] After 6pm go to [email or web address] - password [email or web address] [facility]  [place] [facility]  Office  [phone]  CC: Primary care physician; Malvin Johns, PTA (Inactive)

## 2016-03-16 NOTE — Progress Notes (Signed)
Pt. Slept throughout the night with no c/o of pain. Pt. Has been NPO past midnight awaiting Myoview. Will continue to monitor pt.

## 2016-03-16 NOTE — Consult Note (Signed)
[facility] Cardiology Consultation Note  Patient ID: Nathaniel Colon, MRN: [id number], DOB/AGE: [birth year] 55 y.o. Admit date: 03/15/2016   Date of Consult: 03/16/2016 Primary Physician: Corene Cornea (Inactive) Primary Cardiologist: None  Chief Complaint:  Chief Complaint  Patient presents with  . Chest Pain   Reason for Consult: chest pain with known significant risk factors for cardiovascular disease  HPI: 55 y.o. male with known diabetes with complications of peripheral vascular neuropathy and neuromuscular disorder with hyperlipidemia and hypertension. Patient has been on appropriate insulin injection and an aspirin but has not had any high blood pressure requiring intervention. He is previously had been on statin therapy but stopped due to some weakness and fatigue. The patient has had new onset of the atypical chest discomfort substernal in nature associated with a hot flash E feeling and shortness of breath intermittently while standing but not with physical activity. This is waxing and waning over a several hour period and he was admitted to the hospital. At that time he had an EKG showing normal sinus rhythm and a troponin of 0.03 but did not change and did not appear consistent with acute coronary syndrome. Patient is subsequently undergone a treadmill EKG with a reasonable exercise tolerance at 10 minutes without EKG changes and only minimal shortness of breath and chest heaviness but didn't accelerate with chest pain at peak stress. Therefore he had a negative stress test  Past Medical History:  Diagnosis Date  . Diabetes mellitus   . GERD (gastroesophageal reflux disease)   . H/O hiatal hernia   . Neuromuscular disorder (HCC)    MUSCLE SPASMS  . Peripheral vascular disease (HCC)    PERIPHERAL NEUROPATHY       Surgical History:  Past Surgical History:  Procedure Laterality Date  . ANTERIOR CERVICAL DECOMP/DISCECTOMY FUSION  08/30/2011   Procedure: ANTERIOR CERVICAL  DECOMPRESSION/DISCECTOMY FUSION 2 LEVELS;  Surgeon: Eldred Manges, MD;  Location: [facility] OR;  Service: Orthopedics;  Laterality: N/A;  C5-6, C6-7 Anterior Cervical Discectomy and Fusion, Allograft, Plate  . LATERAL EPICONDYLE RELEASE  08/30/2011   Procedure: TENNIS ELBOW RELEASE;  Surgeon: Eldred Manges, MD;  Location: [facility] OR;  Service: Orthopedics;  Laterality: Right;  Right Lateral Epicondyle Release, Drilling Repair  . NO PAST SURGERIES       Home Meds: Prior to Admission medications   Medication Sig Start Date End Date Taking? Authorizing Provider  insulin NPH-regular Human (NOVOLIN 70/30) (70-30) 100 UNIT/ML injection Inject 18-20 Units into the skin 2 (two) times daily with a meal.   Yes Historical Provider, MD    Inpatient Medications:  . aspirin EC  81 mg Oral Daily  . enoxaparin (LOVENOX) injection  40 mg Subcutaneous Q24H  . insulin aspart  0-5 Units Subcutaneous QHS  . insulin aspart  0-9 Units Subcutaneous TID WC  . insulin aspart protamine- aspart  12 Units Subcutaneous BID WC  . sodium chloride flush  3 mL Intravenous Q12H  . sodium chloride flush  3 mL Intravenous Q12H      Allergies: No Known Allergies  Social History   Social History  . Marital status: Married    Spouse name: N/A  . Number of children: N/A  . Years of education: N/A   Occupational History  . Not on file.   Social History Main Topics  . Smoking status: Current Every Day Smoker    Packs/day: 0.50    Types: Cigarettes  . Smokeless tobacco: Current User  . Alcohol use Yes  .  Drug use: No  . Sexual activity: Not on file   Other Topics Concern  . Not on file   Social History Narrative  . No narrative on file     History reviewed. No pertinent family history.   Review of Systems Positive for Chest pain shortness of breath Negative for: General:  chills, fever, night sweats or weight changes.  Cardiovascular: PND orthopnea syncope dizziness  Dermatological skin lesions rashes Respiratory:  Cough congestion Urologic: Frequent urination urination at night and hematuria Abdominal: negative for nausea, vomiting, diarrhea, bright red blood per rectum, melena, or hematemesis Neurologic: negative for visual changes, and/or hearing changes  All other systems reviewed and are otherwise negative except as noted above.  Labs:  Recent Labs  03/15/16 1253 03/15/16 1519 03/15/16 1750 03/16/16 0511  TROPONINI <0.03 0.03* 0.04* <0.03   Lab Results  Component Value Date   WBC 7.9 03/15/2016   HGB 17.1 03/15/2016   HCT 50.8 03/15/2016   MCV 92.2 03/15/2016   PLT 169 03/15/2016    Recent Labs Lab 03/15/16 1253  NA 137  K 4.4  CL 103  CO2 25  BUN 23*  CREATININE 1.16  CALCIUM 9.0  GLUCOSE 360*   No results found for: CHOL, HDL, LDLCALC, TRIG No results found for: DDIMER  Radiology/Studies:  Dg Chest 2 View  Result Date: 03/15/2016 CLINICAL DATA:  Shortness of breath and chest pain EXAM: CHEST  2 VIEW COMPARISON:  02/17/2015 FINDINGS: The heart size and mediastinal contours are within normal limits. Both lungs are clear. The visualized skeletal structures are unremarkable. IMPRESSION: No active cardiopulmonary disease. Electronically Signed   By: Alcide Clever M.D.   On: 03/15/2016 13:28    EKG: Normal sinus rhythm  Weights: Filed Weights   03/15/16 1254 03/15/16 2028 03/16/16 0654  Weight: 160 lb (72.6 kg) 162 lb 14.4 oz (73.9 kg) 163 lb (73.9 kg)     Physical Exam: Blood pressure 118/74, pulse (!) 58, temperature 98 F (36.7 C), temperature source Oral, resp. rate 16, height 6\' 2"  (1.88 m), weight 163 lb (73.9 kg), SpO2 98 %. Body mass index is 20.93 kg/m. General: Well developed, well nourished, in no acute distress. Head eyes ears nose throat: Normocephalic, atraumatic, sclera non-icteric, no xanthomas, nares are without discharge. No apparent thyromegaly and/or mass  Lungs: Normal respiratory effort.  no wheezes, no rales, no rhonchi.  Heart: RRR with normal  S1 S2. no murmur gallop, no rub, PMI is normal size and placement, carotid upstroke normal without bruit, jugular venous pressure is normal Abdomen: Soft, non-tender, non-distended with normoactive bowel sounds. No hepatomegaly. No rebound/guarding. No obvious abdominal masses. Abdominal aorta is normal size without bruit Extremities: No edema. no cyanosis, no clubbing, no ulcers  Peripheral : 2+ bilateral upper extremity pulses, 2+ bilateral femoral pulses, 2+ bilateral dorsal pedal pulse Neuro: Alert and oriented. No facial asymmetry. No focal deficit. Moves all extremities spontaneously. Musculoskeletal: Normal muscle tone without kyphosis Psych:  Responds to questions appropriately with a normal affect.    Assessment: 55 year old male with diabetes with complications hyperlipidemia and borderline hypertension having atypical chest discomfort and hot flash feeling without evidence of myocardial infarction and/or acute coronary syndrome with a normal treadmill EKG at low risk for major cardiovascular complication  Plan: 1. No further cardiac workup at this time and okay for discharge to home with follow-up closely of chest pain and other symptoms if ambulating in the hallways without further symptoms 2. Aspirin for further risk reduction cardiovascular event  3. High intensity cholesterol therapy for risk reduction and peripheral vascular disease and coronary artery disease 4. Further consideration of outpatient testing if patient continues to have recurrent symptoms  Signed, Lamar Blinks M.D. [facility] 03/16/2016, 9:56 AM

## 2016-03-16 NOTE — Progress Notes (Signed)
Pt to be discharged today after having negative stress test. Iv and tele removed. disch instructions given to pt to his understanding. Left by himself to be transported in his own car.

## 2016-03-16 NOTE — Discharge Instructions (Signed)
F/u with your PCP Keep log of your sugars

## 2016-03-18 LAB — NM MYOCAR MULTI W/SPECT W/WALL MOTION / EF
CHL CUP MPHR: 167 {beats}/min
CHL CUP NUCLEAR SDS: 4
CHL CUP RESTING HR STRESS: 57 {beats}/min
CSEPED: 10 min
CSEPEDS: 0 s
CSEPEW: 11.6 METS
CSEPHR: 79 %
LV sys vol: 30 mL
LVDIAVOL: 87 mL (ref 62–150)
Peak HR: 133 {beats}/min
SRS: 4
SSS: 6
TID: 0.9

## 2016-04-22 DIAGNOSIS — I200 Unstable angina: Secondary | ICD-10-CM | POA: Diagnosis present

## 2016-05-01 ENCOUNTER — Encounter
Admission: RE | Disposition: A | Discharge status: Home / Self Care | Payer: Self-pay | Source: Ambulatory Visit | Attending: Internal Medicine

## 2016-05-01 ENCOUNTER — Observation Stay
Admission: RE | Admit: 2016-05-01 | Discharge: 2016-05-02 | Disposition: A | Discharge status: Home / Self Care | Payer: BLUE CROSS/BLUE SHIELD | Source: Ambulatory Visit | Attending: Internal Medicine | Admitting: Internal Medicine

## 2016-05-01 ENCOUNTER — Ambulatory Visit: Admit: 2016-05-01 | Payer: No Typology Code available for payment source | Admitting: Internal Medicine

## 2016-05-01 ENCOUNTER — Encounter: Payer: Self-pay | Admitting: *Deleted

## 2016-05-01 DIAGNOSIS — Z794 Long term (current) use of insulin: Secondary | ICD-10-CM | POA: Insufficient documentation

## 2016-05-01 DIAGNOSIS — Z79899 Other long term (current) drug therapy: Secondary | ICD-10-CM | POA: Insufficient documentation

## 2016-05-01 DIAGNOSIS — E782 Mixed hyperlipidemia: Secondary | ICD-10-CM | POA: Insufficient documentation

## 2016-05-01 DIAGNOSIS — Z833 Family history of diabetes mellitus: Secondary | ICD-10-CM | POA: Insufficient documentation

## 2016-05-01 DIAGNOSIS — Z7982 Long term (current) use of aspirin: Secondary | ICD-10-CM | POA: Diagnosis not present

## 2016-05-01 DIAGNOSIS — Z8249 Family history of ischemic heart disease and other diseases of the circulatory system: Secondary | ICD-10-CM | POA: Insufficient documentation

## 2016-05-01 DIAGNOSIS — M542 Cervicalgia: Secondary | ICD-10-CM | POA: Diagnosis not present

## 2016-05-01 DIAGNOSIS — M25519 Pain in unspecified shoulder: Secondary | ICD-10-CM | POA: Diagnosis not present

## 2016-05-01 DIAGNOSIS — Z87891 Personal history of nicotine dependence: Secondary | ICD-10-CM | POA: Insufficient documentation

## 2016-05-01 DIAGNOSIS — I25119 Atherosclerotic heart disease of native coronary artery with unspecified angina pectoris: Secondary | ICD-10-CM | POA: Diagnosis present

## 2016-05-01 DIAGNOSIS — M79603 Pain in arm, unspecified: Secondary | ICD-10-CM | POA: Diagnosis not present

## 2016-05-01 DIAGNOSIS — E7800 Pure hypercholesterolemia, unspecified: Secondary | ICD-10-CM | POA: Insufficient documentation

## 2016-05-01 DIAGNOSIS — I10 Essential (primary) hypertension: Secondary | ICD-10-CM | POA: Insufficient documentation

## 2016-05-01 DIAGNOSIS — E119 Type 2 diabetes mellitus without complications: Secondary | ICD-10-CM | POA: Diagnosis not present

## 2016-05-01 DIAGNOSIS — I4510 Unspecified right bundle-branch block: Secondary | ICD-10-CM | POA: Diagnosis not present

## 2016-05-01 DIAGNOSIS — I200 Unstable angina: Secondary | ICD-10-CM | POA: Diagnosis present

## 2016-05-01 HISTORY — DX: Angina pectoris, unspecified: I20.9

## 2016-05-01 HISTORY — PX: CARDIAC CATHETERIZATION: SHX172

## 2016-05-01 LAB — GLUCOSE, CAPILLARY: Glucose-Capillary: 169 mg/dL — ABNORMAL HIGH (ref 65–99)

## 2016-05-01 SURGERY — LEFT HEART CATH AND CORONARY ANGIOGRAPHY
Anesthesia: Moderate Sedation | Laterality: Left

## 2016-05-01 SURGERY — CORONARY STENT INTERVENTION
Anesthesia: Moderate Sedation | Site: Groin

## 2016-05-01 MED ORDER — IOPAMIDOL (ISOVUE-300) INJECTION 61%
INTRAVENOUS | Status: DC | PRN
  Administered 2016-05-01: 120 mL via INTRA_ARTERIAL

## 2016-05-01 MED ORDER — SODIUM CHLORIDE 0.9% FLUSH: 3.0000 mL | INTRAVENOUS | Status: DC | PRN

## 2016-05-01 MED ORDER — ASPIRIN 81 MG PO CHEW
81.0000 mg | CHEWABLE_TABLET | Freq: Every day | ORAL | Status: DC
  Administered 2016-05-02: 81 mg via ORAL
  Filled 2016-05-01 (×2): qty 1

## 2016-05-01 MED ORDER — FENTANYL CITRATE (PF) 100 MCG/2ML IJ SOLN
INTRAMUSCULAR | Status: DC | PRN
  Administered 2016-05-01: 25 ug via INTRAVENOUS

## 2016-05-01 MED ORDER — BIVALIRUDIN BOLUS VIA INFUSION - CUPID
INTRAVENOUS | Status: DC | PRN
  Administered 2016-05-01: 56.1 mg via INTRAVENOUS

## 2016-05-01 MED ORDER — HEPARIN SODIUM (PORCINE) 1000 UNIT/ML IJ SOLN
INTRAMUSCULAR | Status: AC
  Filled 2016-05-01: qty 1

## 2016-05-01 MED ORDER — OXYCODONE-ACETAMINOPHEN 5-325 MG PO TABS
1.0000 | ORAL_TABLET | ORAL | Status: DC | PRN
  Administered 2016-05-01: 2 via ORAL
  Administered 2016-05-02 (×2): 1 via ORAL
  Filled 2016-05-01 (×2): qty 1

## 2016-05-01 MED ORDER — SODIUM CHLORIDE 0.9% FLUSH: 3.0000 mL | Freq: Two times a day (BID) | INTRAVENOUS | Status: DC

## 2016-05-01 MED ORDER — ASPIRIN 81 MG PO CHEW
CHEWABLE_TABLET | ORAL | Status: AC
  Filled 2016-05-01: qty 1

## 2016-05-01 MED ORDER — TICAGRELOR 90 MG PO TABS
ORAL_TABLET | ORAL | Status: AC
  Filled 2016-05-01: qty 2

## 2016-05-01 MED ORDER — MIDAZOLAM HCL 2 MG/2ML IJ SOLN
INTRAMUSCULAR | Status: AC
  Filled 2016-05-01: qty 2

## 2016-05-01 MED ORDER — TICAGRELOR 90 MG PO TABS
ORAL_TABLET | ORAL | Status: DC | PRN
  Administered 2016-05-01: 180 mg via ORAL

## 2016-05-01 MED ORDER — SODIUM CHLORIDE 0.9 % WEIGHT BASED INFUSION: 1.0000 mL/kg/h | INTRAVENOUS | Status: DC

## 2016-05-01 MED ORDER — ONDANSETRON HCL 4 MG/2ML IJ SOLN: 4.0000 mg | Freq: Four times a day (QID) | INTRAMUSCULAR | Status: DC | PRN

## 2016-05-01 MED ORDER — ACETAMINOPHEN 325 MG PO TABS: 650.0000 mg | ORAL_TABLET | ORAL | Status: DC | PRN

## 2016-05-01 MED ORDER — FENTANYL CITRATE (PF) 100 MCG/2ML IJ SOLN
INTRAMUSCULAR | Status: DC | PRN
  Administered 2016-05-01 (×2): 50 ug via INTRAVENOUS

## 2016-05-01 MED ORDER — SODIUM CHLORIDE 4 MEQ/ML IV SOLN: INTRAVENOUS | Status: DC

## 2016-05-01 MED ORDER — ALUM & MAG HYDROXIDE-SIMETH 200-200-20 MG/5ML PO SUSP
ORAL | Status: AC
  Filled 2016-05-01: qty 30

## 2016-05-01 MED ORDER — SODIUM CHLORIDE 0.9% FLUSH
3.0000 mL | Freq: Two times a day (BID) | INTRAVENOUS | Status: DC
  Administered 2016-05-01: 3 mL via INTRAVENOUS

## 2016-05-01 MED ORDER — HYDROMORPHONE HCL 1 MG/ML IJ SOLN
INTRAMUSCULAR | Status: AC
  Filled 2016-05-01: qty 1

## 2016-05-01 MED ORDER — MIDAZOLAM HCL 2 MG/2ML IJ SOLN
INTRAMUSCULAR | Status: DC | PRN
  Administered 2016-05-01 (×2): 1 mg via INTRAVENOUS

## 2016-05-01 MED ORDER — SODIUM CHLORIDE 0.9% FLUSH
3.0000 mL | INTRAVENOUS | Status: DC | PRN
Start: 2016-05-01 — End: 2016-05-01

## 2016-05-01 MED ORDER — ASPIRIN 81 MG PO CHEW
CHEWABLE_TABLET | ORAL | Status: AC
  Filled 2016-05-01: qty 3

## 2016-05-01 MED ORDER — OXYCODONE-ACETAMINOPHEN 5-325 MG PO TABS: 1.0000 | ORAL_TABLET | ORAL | Status: DC | PRN

## 2016-05-01 MED ORDER — HEPARIN SODIUM (PORCINE) 5000 UNIT/ML IJ SOLN
INTRAMUSCULAR | Status: AC
  Filled 2016-05-01: qty 1

## 2016-05-01 MED ORDER — SODIUM CHLORIDE 0.9 % IV SOLN: 250.0000 mL | INTRAVENOUS | Status: DC | PRN

## 2016-05-01 MED ORDER — HEPARIN (PORCINE) IN NACL 100-0.45 UNIT/ML-% IJ SOLN
INTRAMUSCULAR | Status: AC
  Administered 2016-05-01: 09:00:00
  Filled 2016-05-01: qty 250

## 2016-05-01 MED ORDER — DEXTROSE-NACL 5-0.9 % IV SOLN: INTRAVENOUS | Status: DC

## 2016-05-01 MED ORDER — SODIUM CHLORIDE 0.9 % WEIGHT BASED INFUSION: 3.0000 mL/kg/h | INTRAVENOUS | Status: AC

## 2016-05-01 MED ORDER — FENTANYL CITRATE (PF) 100 MCG/2ML IJ SOLN
INTRAMUSCULAR | Status: AC
  Filled 2016-05-01: qty 2

## 2016-05-01 MED ORDER — IOPAMIDOL (ISOVUE-300) INJECTION 61%
INTRAVENOUS | Status: DC | PRN
  Administered 2016-05-01: 290 mL via INTRA_ARTERIAL

## 2016-05-01 MED ORDER — HEPARIN (PORCINE) IN NACL 2-0.9 UNIT/ML-% IJ SOLN
INTRAMUSCULAR | Status: AC
  Filled 2016-05-01: qty 500

## 2016-05-01 MED ORDER — ASPIRIN 81 MG PO CHEW
81.0000 mg | CHEWABLE_TABLET | ORAL | Status: AC
  Administered 2016-05-01: 81 mg via ORAL

## 2016-05-01 MED ORDER — MORPHINE SULFATE (PF) 2 MG/ML IV SOLN
2.0000 mg | Freq: Once | INTRAVENOUS | Status: AC
  Administered 2016-05-01: 2 mg via INTRAVENOUS

## 2016-05-01 MED ORDER — HEPARIN SODIUM (PORCINE) 5000 UNIT/ML IJ SOLN
5000.0000 [IU] | Freq: Once | INTRAMUSCULAR | Status: AC
  Administered 2016-05-01: 5000 [IU] via INTRAVENOUS

## 2016-05-01 MED ORDER — SODIUM CHLORIDE 0.9 % IV SOLN
INTRAVENOUS | Status: DC | PRN
  Administered 2016-05-01: 1.75 mg/kg/h via INTRAVENOUS

## 2016-05-01 MED ORDER — SODIUM CHLORIDE 0.9 % WEIGHT BASED INFUSION
224.4000 mL/h | INTRAVENOUS | Status: DC
  Administered 2016-05-01: 07:00:00 via INTRAVENOUS

## 2016-05-01 MED ORDER — OXYCODONE-ACETAMINOPHEN 5-325 MG PO TABS
ORAL_TABLET | ORAL | Status: AC
  Filled 2016-05-01: qty 2

## 2016-05-01 MED ORDER — ASPIRIN 81 MG PO CHEW
CHEWABLE_TABLET | ORAL | Status: DC | PRN
  Administered 2016-05-01: 243 mg via ORAL

## 2016-05-01 MED ORDER — NITROGLYCERIN 5 MG/ML IV SOLN
INTRAVENOUS | Status: AC
  Filled 2016-05-01: qty 10

## 2016-05-01 MED ORDER — MORPHINE SULFATE (PF) 2 MG/ML IV SOLN
INTRAVENOUS | Status: AC
  Administered 2016-05-01: 2 mg via INTRAVENOUS
  Filled 2016-05-01: qty 1

## 2016-05-01 MED ORDER — TICAGRELOR 90 MG PO TABS
90.0000 mg | ORAL_TABLET | Freq: Two times a day (BID) | ORAL | Status: DC
  Administered 2016-05-01 – 2016-05-02 (×2): 90 mg via ORAL
  Filled 2016-05-01 (×3): qty 1

## 2016-05-01 MED ORDER — BIVALIRUDIN 250 MG IV SOLR
INTRAVENOUS | Status: AC
  Filled 2016-05-01: qty 250

## 2016-05-01 MED ORDER — ASPIRIN 81 MG PO CHEW: 81.0000 mg | CHEWABLE_TABLET | Freq: Every day | ORAL | Status: DC

## 2016-05-01 MED ORDER — TICAGRELOR 90 MG PO TABS: 90.0000 mg | ORAL_TABLET | Freq: Two times a day (BID) | ORAL | Status: DC

## 2016-05-01 SURGICAL SUPPLY — 9 items
CATH 5FR JL4 DIAGNOSTIC (CATHETERS) ×3 IMPLANT
CATH 5FR PIGTAIL DIAGNOSTIC (CATHETERS) ×2 IMPLANT
CATH INFINITI JR4 5F (CATHETERS) ×3 IMPLANT
KIT MANI 3VAL PERCEP (MISCELLANEOUS) ×3 IMPLANT
NEEDLE PERC 18GX7CM (NEEDLE) ×3 IMPLANT
PACK CARDIAC CATH (CUSTOM PROCEDURE TRAY) ×3 IMPLANT
SHEATH PINNACLE 5F 10CM (SHEATH) ×3 IMPLANT
SUT SILK 0 FSL (SUTURE) ×3 IMPLANT
WIRE EMERALD 3MM-J .035X150CM (WIRE) ×3 IMPLANT

## 2016-05-01 SURGICAL SUPPLY — 16 items
BALLN TREK RX 2.5X12 (BALLOONS) ×3
BALLN ~~LOC~~ TREK RX 3.5X12 (BALLOONS) ×3
BALLOON TREK RX 2.5X12 (BALLOONS) ×1 IMPLANT
BALLOON ~~LOC~~ TREK RX 3.5X12 (BALLOONS) ×1 IMPLANT
CATH VISTA GUIDE 6FR JR4 SH (CATHETERS) ×3 IMPLANT
CATH VISTA GUIDE 6FR XB3.5 SH (CATHETERS) ×3 IMPLANT
DEVICE CLOSURE MYNXGRIP 6/7F (Vascular Products) ×3 IMPLANT
DEVICE INFLAT 30 PLUS (MISCELLANEOUS) ×3 IMPLANT
DEVICE SAFEGUARD 24CM (GAUZE/BANDAGES/DRESSINGS) ×3 IMPLANT
KIT MANI 3VAL PERCEP (MISCELLANEOUS) ×3 IMPLANT
PACK CARDIAC CATH (CUSTOM PROCEDURE TRAY) ×3 IMPLANT
SHEATH AVANTI 6FR X 11CM (SHEATH) ×3 IMPLANT
STENT XIENCE ALPINE RX 3.0X15 (Permanent Stent) ×3 IMPLANT
STENT XIENCE ALPINE RX 3.0X18 (Permanent Stent) ×3 IMPLANT
WIRE EMERALD 3MM-J .035X150CM (WIRE) ×3 IMPLANT
WIRE G HI TQ BMW 190 (WIRE) ×3 IMPLANT

## 2016-05-01 NOTE — Progress Notes (Signed)
Pt in post cath recovery, clinically stable, [facility], right groin site with sheath intact, no bleeding nor hematoma, heparin bolus and gtt started . Morphine 2mg  iv given earlier for lower back pain since pt on bedrest, resting at this time. sr per monitor,vss

## 2016-05-01 NOTE — Progress Notes (Signed)
Pt remains clinically stable post heart cath with stents placed to lad/rca lesions. Less pain to lower back after percocet given earlier. [facility], pt may have head of bed up at 1500

## 2016-05-01 NOTE — Progress Notes (Signed)
Patient received to the unit, S/P cath. Alert and oriented able to make needs known. Right groin site dressing intact, no bleeding, no hematoma, site 0. Tele verified with [facility].Patient stable at this time, no c/o. Will continues to monitor.

## 2016-05-02 ENCOUNTER — Encounter: Payer: Self-pay | Admitting: Internal Medicine

## 2016-05-02 DIAGNOSIS — I25119 Atherosclerotic heart disease of native coronary artery with unspecified angina pectoris: Secondary | ICD-10-CM | POA: Diagnosis not present

## 2016-05-02 MED ORDER — CLOPIDOGREL BISULFATE 75 MG PO TABS: 75.0000 mg | ORAL_TABLET | Freq: Every day | ORAL | 4 refills | Status: DC

## 2016-05-02 NOTE — Discharge Summary (Signed)
[facility]  Patient ID: Nathaniel Landsmanrnold R Kreiger MRN: [id number] DOB/AGE: [birth year] 54 y.o.  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 And RCA  Hospital Course: The patient was admitted to specials for cardiac cath with selective coronary angiogram after full consent, risk and benefits explained, and time out called with all approprate details voiced and discussed. The patient has had progressive canadian class 4 angina with high probability risk stress test consistent with ischemic chest pain and or anginal equivalent with coronary artery risk factors including high blood pressure, high cholesterol and smoking. The procedure was performed without complication and it revealed normal left ventricular function with ejection fraction of 60%.  It was found that the patient had severe 2 vessel coronary atherosclerosis with significant lad and rca stenosis requiring further intervention. Therefore, the patient had a PCI and drug eluding stent placed without complication. The patient has been ambulating without further significant symptoms and has reached his maximal hospital benefit and will be discharged to home in good condition.  Cardiac rehabilitation has been discussed and recommended. Medication management of cardiovascular risk factors will be given post discharge and modified as an outpatient.   Discharge Exam: Blood pressure (!) 142/78, pulse 65, temperature 98.5 F (36.9 C), temperature source Oral, resp. rate 18, height 6\' 2"  (1.88 m), weight 165 lb (74.8 kg), SpO2 95 %.  Constitutional: Alet oriented to person, place, and time. No  distress.  HENT: No nasal discharge.  Head: Normocephalic and atraumatic.  Eyes: Pupils are equal and round. No discharge.  Neck: Normal range of motion. Neck supple. No JVD present. No thyromegaly present.  Cardiovascular: Normal rate, regular rhythm, normal S1 S2, no gallop, no friction rub. No murmur Pulmonary/Chest: Effort normal, No stridor. No respiratory distress. no wheezes.  no rales.    Abdominal: Soft. Bowel sounds are normal.  no distension.  no tenderness. There is no rebound and no guarding.  Musculoskeletal: No edema, no cyanosis, normal pulses, no bleeding, Normal range of motion. no tenderness.  Neurological:  alert and oriented to person, place, and time. Coordination normal.  Skin: Skin is warm and dry. No rash noted. No erythema. No pallor.  Psychiatric:  normal mood and affect. behavior is normal.    Labs:   Lab Results  Component Value Date   WBC 7.9 03/15/2016   HGB 17.1 03/15/2016   HCT 50.8 03/15/2016   MCV 92.2 03/15/2016   PLT 169 03/15/2016   No results for input(s): NA, K, CL, CO2, BUN, CREATININE, CALCIUM, PROT, BILITOT, ALKPHOS, ALT, AST, GLUCOSE in the last 168 hours.  Invalid input(s): LABALBU  EKG: NSR without evidence of new changes  FOLLOW UP IN ONE TO TWO WEEKS Discharge Instructions    AMB Referral to Cardiac Rehabilitation - Phase II    Complete by:  As directed    Diagnosis:  Coronary Stents       Medication List    STOP taking these medications   isosorbide mononitrate 30 MG 24 hr tablet Commonly known as:  IMDUR   nitroGLYCERIN 0.4 MG SL tablet Commonly known as:  NITROSTAT   omeprazole 20 MG capsule Commonly known as:  PRILOSEC     TAKE these medications   aspirin 81  MG EC tablet Take 1 tablet (81 mg total) by mouth daily.   atorvastatin 10 MG tablet Commonly known as:  LIPITOR Take 10 mg by mouth daily.   clopidogrel 75 MG tablet Commonly known as:  PLAVIX Take 1 tablet (75 mg total) by mouth daily.   insulin  NPH-regular Human (70-30) 100 UNIT/ML injection Commonly known as:  NOVOLIN 70/30 Inject 18-20 Units into the skin 2 (two) times daily with a meal.        THE PATIENT  SHALL BRING ALL MEDICATIONS TO FOLLOW UP APPOINTMENT  Signed:  Lamar Blinks MD, [facility] 05/02/2016, 8:14 AM

## 2016-05-02 NOTE — Progress Notes (Signed)
Patient has no acute event overnight. He remained hemodynamically stable, cath site remained w/o any complication.

## 2018-01-31 IMAGING — CR DG CHEST 2V
1 series · 2 of 2 positions shown · non-contrast
Comparison: 02/17/2015

CLINICAL DATA: Shortness of breath and chest pain

EXAM:
CHEST  2 VIEW

[Series 1: dg chest 2 view · 0.14mm/px · 2 of 2 slices shown]
[im 1/2]
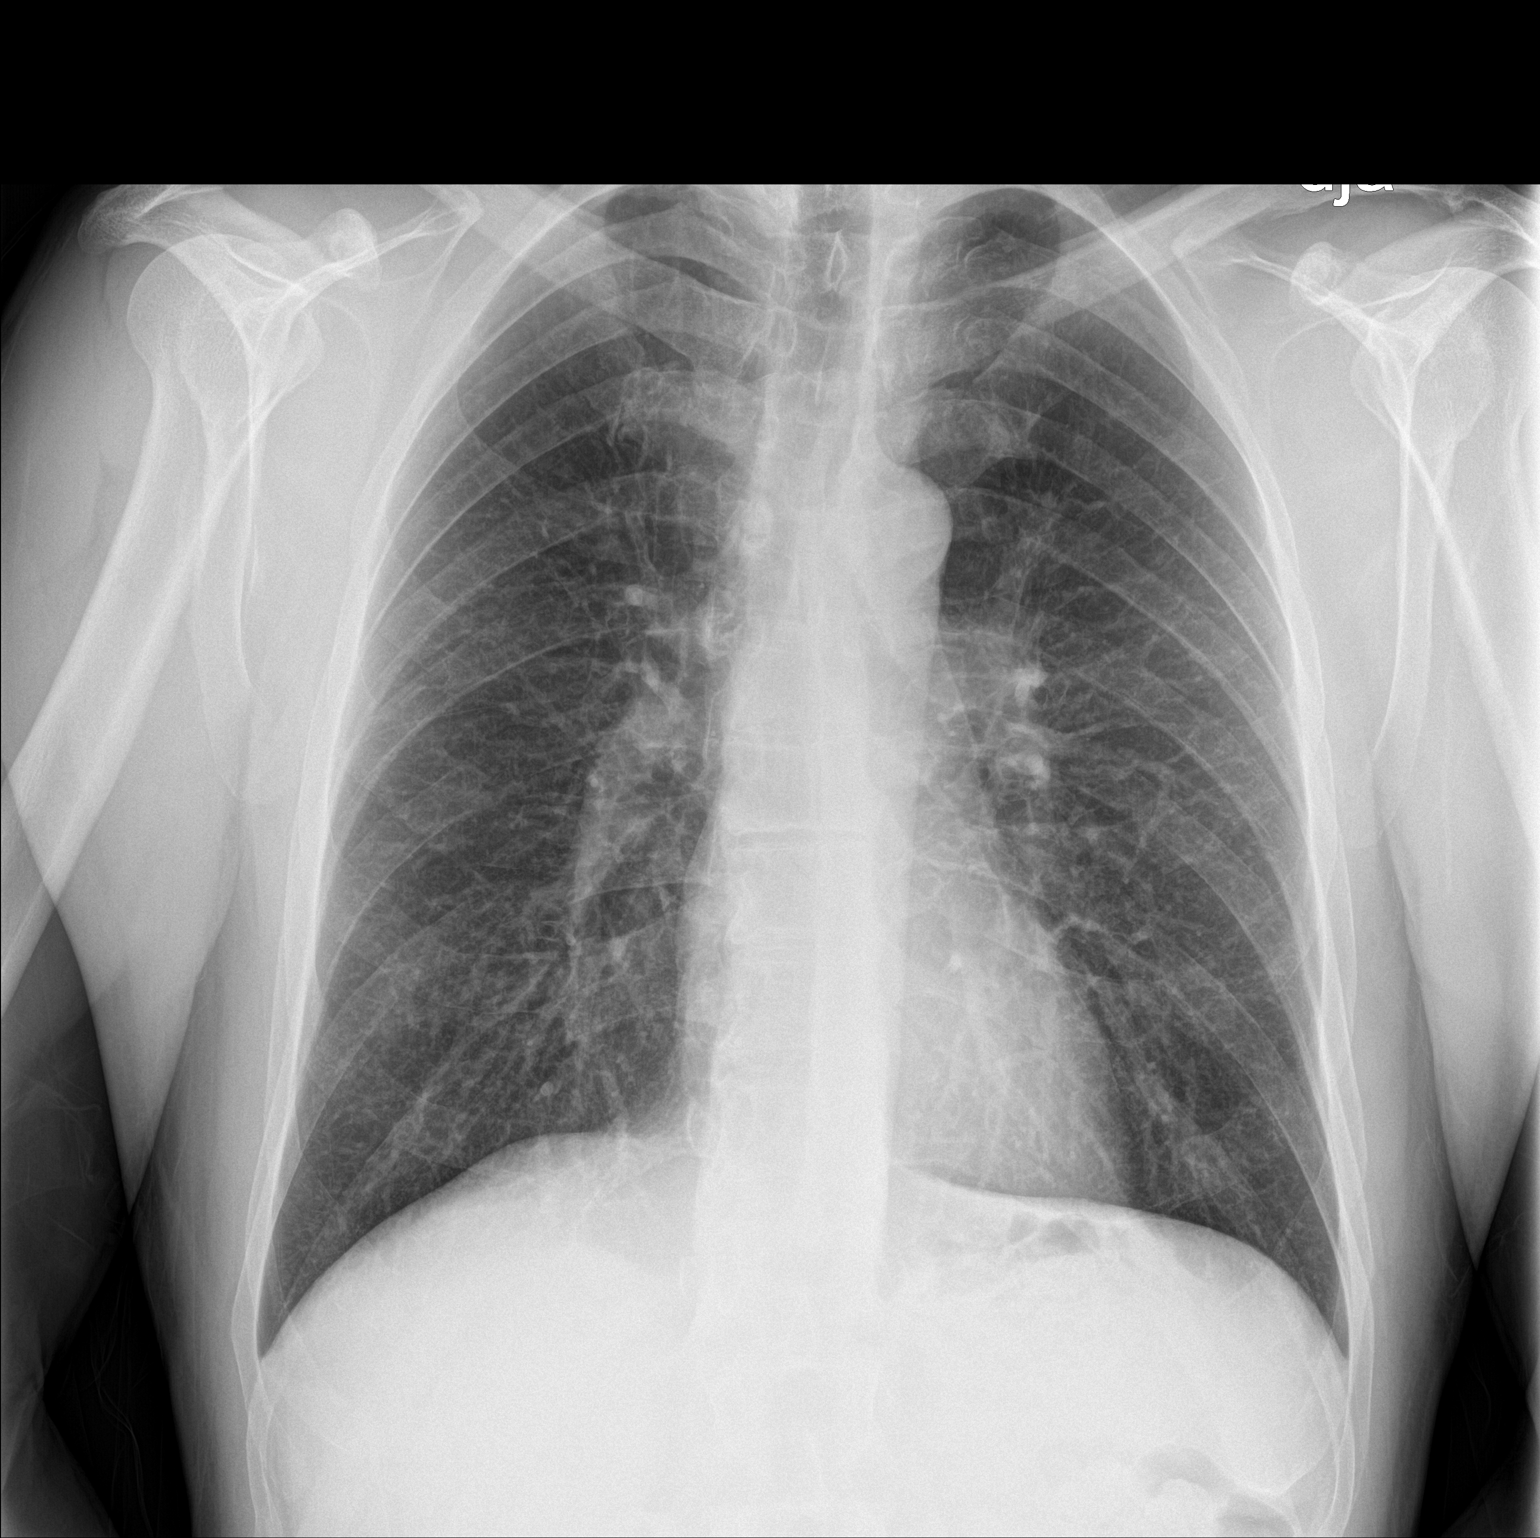
[im 2/2]
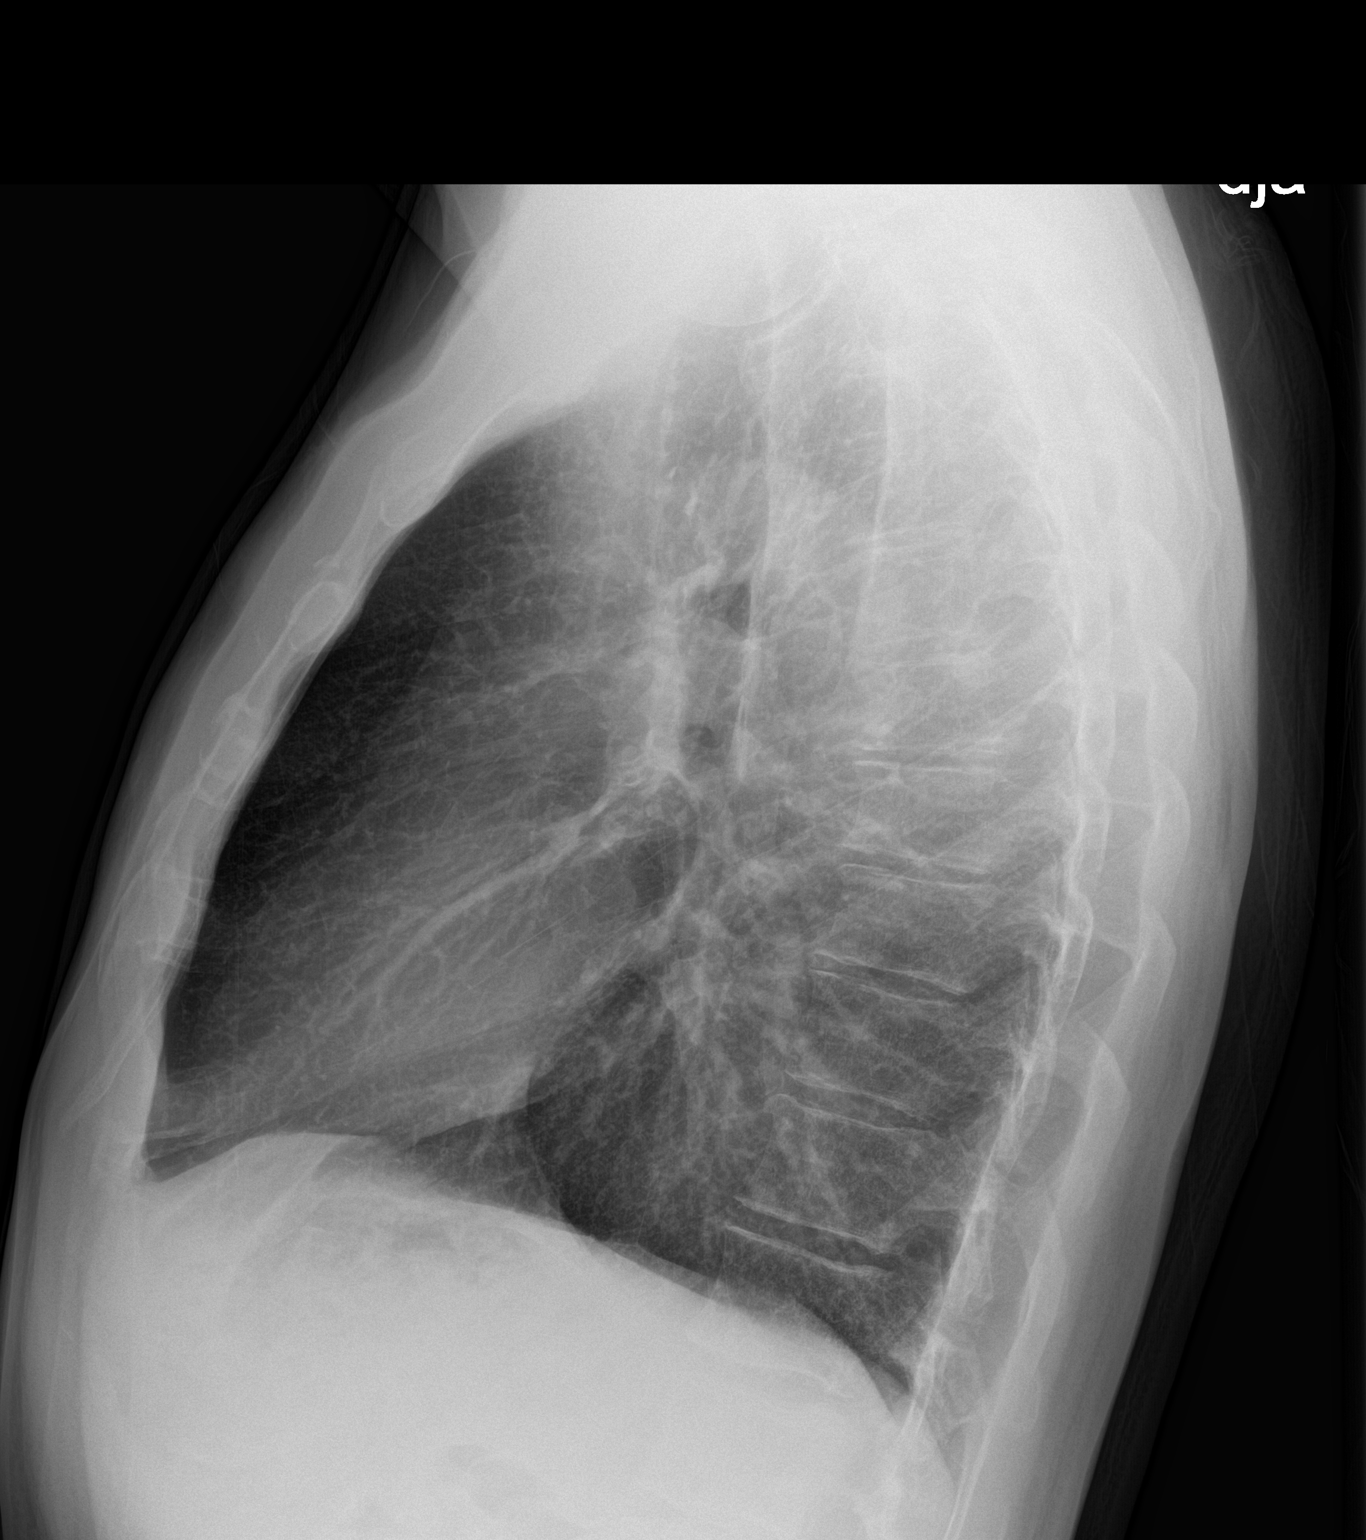

[2 of 2 positions shown; findings below may reference images not displayed]

FINDINGS: The heart size and mediastinal contours are within normal limits.
Both lungs are clear. The visualized skeletal structures are
unremarkable.
IMPRESSION: No active cardiopulmonary disease.

## 2022-06-18 ENCOUNTER — Emergency Department: Payer: No Typology Code available for payment source

## 2022-06-18 ENCOUNTER — Encounter: Payer: Self-pay | Admitting: Emergency Medicine

## 2022-06-18 ENCOUNTER — Other Ambulatory Visit: Payer: Self-pay

## 2022-06-18 ENCOUNTER — Emergency Department
Admission: EM | Admit: 2022-06-18 | Discharge: 2022-06-18 | Disposition: A | Discharge status: Home / Self Care | Payer: No Typology Code available for payment source | Attending: Emergency Medicine | Admitting: Emergency Medicine

## 2022-06-18 DIAGNOSIS — R0602 Shortness of breath: Secondary | ICD-10-CM | POA: Diagnosis present

## 2022-06-18 DIAGNOSIS — Z20822 Contact with and (suspected) exposure to covid-19: Secondary | ICD-10-CM | POA: Insufficient documentation

## 2022-06-18 DIAGNOSIS — I509 Heart failure, unspecified: Secondary | ICD-10-CM | POA: Diagnosis not present

## 2022-06-18 DIAGNOSIS — R051 Acute cough: Secondary | ICD-10-CM | POA: Diagnosis not present

## 2022-06-18 DIAGNOSIS — E119 Type 2 diabetes mellitus without complications: Secondary | ICD-10-CM | POA: Insufficient documentation

## 2022-06-18 LAB — CBC
HCT: 45.1 % (ref 39.0–52.0)
Hemoglobin: 14.4 g/dL (ref 13.0–17.0)
MCH: 27.7 pg (ref 26.0–34.0)
MCHC: 31.9 g/dL (ref 30.0–36.0)
MCV: 86.7 fL (ref 80.0–100.0)
Platelets: 305 10*3/uL (ref 150–400)
RBC: 5.2 MIL/uL (ref 4.22–5.81)
RDW: 13.1 % (ref 11.5–15.5)
WBC: 8.3 10*3/uL (ref 4.0–10.5)
nRBC: 0 % (ref 0.0–0.2)

## 2022-06-18 LAB — RESP PANEL BY RT-PCR (FLU A&B, COVID) ARPGX2
Influenza A by PCR: NEGATIVE
Influenza B by PCR: NEGATIVE
SARS Coronavirus 2 by RT PCR: NEGATIVE

## 2022-06-18 LAB — BASIC METABOLIC PANEL
Anion gap: 6 (ref 5–15)
BUN: 25 mg/dL — ABNORMAL HIGH (ref 6–20)
CO2: 26 mmol/L (ref 22–32)
Calcium: 9.5 mg/dL (ref 8.9–10.3)
Chloride: 104 mmol/L (ref 98–111)
Creatinine, Ser: 1.84 mg/dL — ABNORMAL HIGH (ref 0.61–1.24)
GFR, Estimated: 42 mL/min — ABNORMAL LOW (ref >60)
Glucose, Bld: 307 mg/dL — ABNORMAL HIGH (ref 70–99)
Potassium: 4.1 mmol/L (ref 3.5–5.1)
Sodium: 136 mmol/L (ref 135–145)

## 2022-06-18 LAB — BRAIN NATRIURETIC PEPTIDE: B Natriuretic Peptide: 19 pg/mL (ref 0.0–100.0)

## 2022-06-18 LAB — TROPONIN I (HIGH SENSITIVITY)
Troponin I (High Sensitivity): 10 ng/L (ref <18)
Troponin I (High Sensitivity): 10 ng/L (ref <18)

## 2022-06-18 MED ORDER — IOHEXOL 350 MG/ML SOLN
60.0000 mL | Freq: Once | INTRAVENOUS | Status: AC | PRN
  Administered 2022-06-18: 60 mL via INTRAVENOUS

## 2022-06-18 MED ORDER — BENZONATATE 100 MG PO CAPS: 100.0000 mg | ORAL_CAPSULE | Freq: Three times a day (TID) | ORAL | 0 refills | Status: DC | PRN

## 2022-06-18 NOTE — ED Provider Notes (Signed)
[facility] Provider Note    Event Date/Time   First MD Initiated Contact with Patient 06/18/22 1605     (approximate)   History   Shortness of Breath   HPI  Nathaniel Colon is a 61 y.o. male with diabetes, CHF, prior stents followed mostly at the [place] who comes in with concerns for shortness of breath.  Patient reports 2 weeks of cough, congestion and having increasing shortness of breath worse with laying flat.  He denies being on any Lasix.  He states that he feels like he has increased fluid on him.  Does report a history of chronic kidney disease.  He denies ever having a PE previously.  Does report history of pneumonias in the past.  Denies any falls and hitting his head  Physical Exam   Triage Vital Signs: ED Triage Vitals  Enc Vitals Group     BP 06/18/22 1153 (!) 126/91     Pulse Rate 06/18/22 1153 (!) 101     Resp 06/18/22 1153 18     Temp 06/18/22 1153 98.3 F (36.8 C)     Temp Source 06/18/22 1153 Oral     SpO2 06/18/22 1153 93 %     Weight 06/18/22 1154 200 lb (90.7 kg)     Height 06/18/22 1154 6\' 2"  (1.88 m)     Head Circumference --      Peak Flow --      Pain Score 06/18/22 1154 3     Pain Loc --      Pain Edu? --      Excl. in GC? --     Most recent vital signs: Vitals:   06/18/22 1153  BP: (!) 126/91  Pulse: (!) 101  Resp: 18  Temp: 98.3 F (36.8 C)  SpO2: 93%     General: Awake, no distress.  CV:  Good peripheral perfusion.  Resp:  Normal effort.  Abd:  No distention.  Other:  No calf tenderness.  Abdomen soft and nontender   ED Results / Procedures / Treatments   Labs (all labs ordered are listed, but only abnormal results are displayed) Labs Reviewed  BASIC METABOLIC PANEL - Abnormal; Notable for the following components:      Result Value   Glucose, Bld 307 (*)    BUN 25 (*)    Creatinine, Ser 1.84 (*)    GFR, Estimated 42 (*)    All other components within normal limits  CBC  BRAIN NATRIURETIC PEPTIDE   TROPONIN I (HIGH SENSITIVITY)  TROPONIN I (HIGH SENSITIVITY)     EKG  My interpretation of EKG:  Sinus tachycardia the rate of 100 without any ST elevation or T wave inversions, normal intervals  RADIOLOGY I have reviewed the xray personally interpreted no evidence of any pneumonia or pulmonary edema  PROCEDURES:  Critical Care performed: No  .1-3 Lead EKG Interpretation  Performed by: 13/07/23, MD Authorized by: Concha Se, MD     Interpretation: normal     ECG rate:  90   ECG rate assessment: normal     Rhythm: sinus rhythm     Ectopy: none     Conduction: normal      MEDICATIONS ORDERED IN ED: Medications - No data to display   IMPRESSION / MDM / ASSESSMENT AND PLAN / ED COURSE  I reviewed the triage vital signs and the nursing notes.   Patient's presentation is most consistent with acute presentation with potential  threat to life or bodily function.   Patient comes in with worsening shortness of breath tachycardia and oxygen levels are 93%.  Denies any history of smoking or COPD.  Will test for COVID.  Chest x-ray without evidence of pneumonia and pulmonary edema.  BNP was also normal.  Initial troponin was negative but will get a repeat.  His sugars were elevated today but his anion gap was normal therefore no evidence of DKA.  His creatinine was 1.8 but I reviewed his CMP from 7/17 where it was 2.44.  Given the above but without clear reason for shortness of breath will get CT pulmonary embolism to rule out PE, mass, pneumonia.  COVID test was negative.  CT imaging was reassuring without any evidence of PE or dissection  Repeat troponin is.  On repeat evaluations patient's lungs sound clear to auscultation denies any smoking history.  I wonder if patient could have some sleep apnea.  He does have intermittent oxygen levels between 93 and 100% but given reassuring work-up we will give him pulmonary referral.  We will also recommend him using intermitted  fire due to reportedly having a dry cough at nighttime and trying some Tessalon Perles.  Patient expressed understanding felt comfortable with discharge home I considered admission but given he reports no symptoms at this time and feeling better he feels comfortable with discharge home  Patient repeat sugar on his glucometer at this time is 179 and he understands that he should talk to his primary care doctor about his sugars but he reports that he missed a dose of his insulin and that is why he thinks it was high when he was seen this morning The patient is on the cardiac monitor to evaluate for evidence of arrhythmia and/or significant heart rate changes.      FINAL CLINICAL IMPRESSION(S) / ED DIAGNOSES   Final diagnoses:  Acute cough  Shortness of breath     Rx / DC Orders   ED Discharge Orders          Ordered    benzonatate (TESSALON PERLES) 100 MG capsule  3 times daily PRN        06/18/22 1810             Note:  This document was prepared using Dragon voice recognition software and may include unintentional dictation errors.   Vanessa [place], MD 06/18/22 [phone]

## 2022-06-18 NOTE — Discharge Instructions (Addendum)
Your work-up was reassuring without evidence of heart attack and your CT PE was also negative.  You should call the pulmonary doctor to make a follow-up appointment to discuss with your primary care doctor about getting a sleep study to see if he could have sleep apnea.  I would try using a humidifier and some cough drops to try to help with your sleep.  Return to the ER if you develop worsening symptoms, fevers or any other concerns  Your sugars were also elevated today so you should discuss this further with your primary care doctor and return to ER for any other concerns   Your sugar

## 2022-06-18 NOTE — ED Triage Notes (Signed)
Pt to ER with c/o  increasing SHOB, cough and feeling like he has fluid on his lungs.  Pt states this worsens when he lays down.  Pt states he has hx of CHF, states he called the [place] and explained his symptoms and they told him to come to the ER.

## 2022-06-19 ENCOUNTER — Telehealth: Payer: Self-pay | Admitting: Emergency Medicine

## 2022-06-19 NOTE — Telephone Encounter (Signed)
Pt needed referral for pulm for continued cough.

## 2022-08-23 ENCOUNTER — Institutional Professional Consult (permissible substitution): Payer: No Typology Code available for payment source | Admitting: Internal Medicine

## 2022-08-29 ENCOUNTER — Encounter: Payer: Self-pay | Admitting: Pulmonary Disease

## 2022-08-29 ENCOUNTER — Ambulatory Visit (INDEPENDENT_AMBULATORY_CARE_PROVIDER_SITE_OTHER): Payer: No Typology Code available for payment source | Admitting: Pulmonary Disease

## 2022-08-29 VITALS — BP 130/68 | HR 100 | Ht 74.0 in | Wt 208.4 lb

## 2022-08-29 DIAGNOSIS — R0609 Other forms of dyspnea: Secondary | ICD-10-CM | POA: Diagnosis not present

## 2022-08-29 MED ORDER — STIOLTO RESPIMAT 2.5-2.5 MCG/ACT IN AERS: 2.0000 | INHALATION_SPRAY | Freq: Every day | RESPIRATORY_TRACT | 11 refills | Status: DC

## 2022-08-29 NOTE — Progress Notes (Signed)
@Patient  ID: Nathaniel Colon, male    DOB: Apr 26, 1961, 62 y.o.   MRN: [id number]  Chief Complaint  Patient presents with   Consult    Consult for cough and SOB. Pt states that his breathing has gotten worse over the years. No inhalers noted at this time. Pt states his SOB occurs when he is up walking around. Chest xray done 06/18/22 and CT Angio done 06/18/22. He states when he lays flat he has a hard time breathing as well.     Referring provider: Vanessa [place], MD  HPI:   62 y.o. man whom we are seeing for evaluation of dyspnea on exertion.  ED note 06/2022 reviewed.  Discharge summary after PCI 04/2016 reviewed.  Patient reports shortness of breath on the last 6 to months.  Not much issue before that.  Worse on stairs or when hurrying.  No times a day with things are better or worse.  No position makes his better or worse.  No seasonal or environmental factors he can identify that make things better or worse.  No other alleviating or exacerbating factors.  Has not used inhalers.  He endorses a lot of issues with cough, gagging in the night.  Regurgitating, heartburn symptoms very bad.  Following with GI.  Recent endoscopy.  He has a hiatal hernia.  The symptoms seem to be improving with treatment.  He says when he vomits and used to vomit more, it looked like fecal matter.  His chest x-ray 06/2022 personally reviewed interpret as clear lungs bilaterally.  CTA PE protocol 06/2022 personally reviewed and interpreted as no PE, bronchial thickening that is diffuse, otherwise clear lungs.  PMH: Hypertension, CHF, diabetes, CKD, tobacco abuse in remission Surgical history: Neck surgery, PCI times 10/02/2015 Family history: Reviewed, no significant respiratory illness in first-degree relatives Social history: Former smoker, 1 pack a day for 30 years, quit a few years ago, lives in [place] / Pulmonary Flowsheets:   ACT:      No data to display          MMRC:     No  data to display          Epworth:      No data to display          Tests:   FENO:  No results found for: "NITRICOXIDE"  PFT:     No data to display          WALK:      No data to display          Imaging: Personally reviewed and as per EMR discussion this note No results found.  Lab Results: Personally reviewed CBC    Component Value Date/Time   WBC 8.3 06/18/2022 1203   RBC 5.20 06/18/2022 1203   HGB 14.4 06/18/2022 1203   HCT 45.1 06/18/2022 1203   PLT 305 06/18/2022 1203   MCV 86.7 06/18/2022 1203   MCH 27.7 06/18/2022 1203   MCHC 31.9 06/18/2022 1203   RDW 13.1 06/18/2022 1203    BMET    Component Value Date/Time   NA 136 06/18/2022 1203   K 4.1 06/18/2022 1203   CL 104 06/18/2022 1203   CO2 26 06/18/2022 1203   GLUCOSE 307 (H) 06/18/2022 1203   BUN 25 (H) 06/18/2022 1203   CREATININE 1.84 (H) 06/18/2022 1203   CALCIUM 9.5 06/18/2022 1203   GFRNONAA 42 (L) 06/18/2022 1203   GFRAA >60 03/15/2016 1253  BNP    Component Value Date/Time   BNP 19.0 06/18/2022 1203    ProBNP No results found for: "PROBNP"  Specialty Problems   None   No Known Allergies   There is no immunization history on file for this patient.  Past Medical History:  Diagnosis Date   Anginal pain ([place])    Diabetes mellitus    GERD (gastroesophageal reflux disease)    H/O hiatal hernia    Neuromuscular disorder ([place])    MUSCLE SPASMS   Peripheral vascular disease ([facility])    PERIPHERAL NEUROPATHY     Tobacco History: Social History   Tobacco Use  Smoking Status Former   Packs/day: 0.50   Types: Cigarettes  Smokeless Tobacco Current   Ready to quit: Not Answered Counseling given: Not Answered   Continue to not smoke  Outpatient Encounter Medications as of 08/29/2022  Medication Sig   aspirin EC 81 MG EC tablet Take 1 tablet (81 mg total) by mouth daily.   atorvastatin (LIPITOR) 10 MG tablet Take 10 mg by mouth daily.   benzonatate  (TESSALON PERLES) 100 MG capsule Take 1 capsule (100 mg total) by mouth 3 (three) times daily as needed for cough.   clopidogrel (PLAVIX) 75 MG tablet Take 1 tablet (75 mg total) by mouth daily.   insulin NPH-regular Human (NOVOLIN 70/30) (70-30) 100 UNIT/ML injection Inject 18-20 Units into the skin 2 (two) times daily with a meal.   Tiotropium Bromide-Olodaterol (STIOLTO RESPIMAT) 2.5-2.5 MCG/ACT AERS Inhale 2 puffs into the lungs daily.   No facility-administered encounter medications on file as of 08/29/2022.     Review of Systems  Review of Systems  No chest pain with exertion.  No orthopnea or PND.  Comprehensive review of systems otherwise negative. Physical Exam  BP 130/68 (BP Location: Left Arm, Patient Position: Sitting, Cuff Size: Normal)   Pulse 100   Ht 6\' 2"  (1.88 m)   Wt 208 lb 6.4 oz (94.5 kg)   SpO2 95%   BMI 26.76 kg/m   Wt Readings from Last 5 Encounters:  08/29/22 208 lb 6.4 oz (94.5 kg)  06/18/22 200 lb (90.7 kg)  05/01/16 165 lb (74.8 kg)  03/16/16 163 lb (73.9 kg)  08/30/11 176 lb (79.8 kg)    BMI Readings from Last 5 Encounters:  08/29/22 26.76 kg/m  06/18/22 25.68 kg/m  05/01/16 21.18 kg/m  03/16/16 20.93 kg/m  08/30/11 25.99 kg/m     Physical Exam General: Sitting in chair, no acute distress Eyes: EOMI, no icterus Neck: Supple, no JVP appreciated sitting upright Pulmonary: Clear, distant, voice seems weak or strained at times Cardiovascular: Warm, no edema Abdomen: Nondistended, bowel sounds present MSK: No synovitis, no joint effusion Neuro: Normal gait, no weakness Psych: Normal mood, full affect   Assessment & Plan:   Dyspnea on exertion: Present for the last 6 to 8 months. Suspect multifactorial.  Likely chronic bronchitis in the setting of 30-pack-year smoking history.  He has thickened bronchioles on CT scan.  Also, he clearly endorses what sounds like aspiration events of gastro esophageal reflux at night with coughing and  choking gagging.  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  Trial Stiolto.  PFTs for further evaluation.  If not improved and PFTs reassuring, he will need to reengage with cardiology as significant coronary artery disease last stented in 2016.  It would not explain some of the voice changes demonstrated on exam today but may represent anginal equivalent.   Return in about 4  weeks (around 09/26/2022).   Karren Burly, MD 08/29/2022   This appointment required 60 minutes of patient care (this includes precharting, chart review, review of results, face-to-face care, etc.).

## 2022-08-29 NOTE — Patient Instructions (Signed)
Nice to meet you  To help with shortness of breath I recommend using Stiolto 2 puffs once a day.  This is designed to open up the airways and help you breathe easier.  Based on your CT scan in the past, with thickened bronchioles something like bronchitis is possible and this should help.  I think is possible that the reflux symptoms that you have been having could be making this worse.  I recommend continue to follow-up with your GI doctor.  We will get pulmonary function test for further evaluation of your shortness of breath.  Return to clinic in 4 weeks or sooner as needed with Dr. Silas Flood

## 2022-09-19 ENCOUNTER — Ambulatory Visit: Payer: No Typology Code available for payment source | Admitting: Pulmonary Disease

## 2022-09-19 ENCOUNTER — Encounter: Payer: Self-pay | Admitting: Pulmonary Disease

## 2022-09-19 ENCOUNTER — Ambulatory Visit: Payer: No Typology Code available for payment source

## 2022-09-19 VITALS — BP 134/76 | HR 100 | Ht 73.0 in | Wt 209.2 lb

## 2022-09-19 DIAGNOSIS — R0609 Other forms of dyspnea: Secondary | ICD-10-CM

## 2022-09-19 DIAGNOSIS — R682 Dry mouth, unspecified: Secondary | ICD-10-CM | POA: Diagnosis not present

## 2022-09-19 DIAGNOSIS — I2510 Atherosclerotic heart disease of native coronary artery without angina pectoris: Secondary | ICD-10-CM

## 2022-09-19 MED ORDER — ALBUTEROL SULFATE HFA 108 (90 BASE) MCG/ACT IN AERS: 2.0000 | INHALATION_SPRAY | Freq: Four times a day (QID) | RESPIRATORY_TRACT | 3 refills | Status: DC | PRN

## 2022-09-19 NOTE — Patient Instructions (Signed)
Nice to see you  Stop Stiolto  Try Albuterol 2 puffs every 6 hours AS NEEDED for shortness of breath  I sent a referral to Cardiology to evaluate prior history of coronary disease and if could be causing shortness of breath  I sent a referral to Rheumatology for evaluation of dry mouth  Return to clinic in 3 months or sooner as needed with Dr. Silas Flood

## 2022-09-19 NOTE — Progress Notes (Signed)
@Patient  ID: Nathaniel Colon, male    DOB: 1960-09-27, 62 y.o.   MRN: [id number]  Chief Complaint  Patient presents with   Follow-up    Follow up for DOE> Pt was unable to do the PFT today he states he could not breath at all. Pt states that he is unable to tell if there is a difference with the stiolto Or not.     Referring provider: Rosezena Sensor, DO  HPI:   62 y.o. man whom we are seeing for evaluation of dyspnea on exertion.    Overall, no change in symptoms.  Was prescribed Stiolto at last visit.  He does not think it helped.  PFTs were ordered for further evaluation.  Scheduled for today.  He cannot complete them.  Did not attempt.  Suspect he felt claustrophobic he has a history of the same.  His chief complaint is dry mouth.  Feels he cannot open his mouth as the force of his lips at time.  He thinks SMA raising short of breath but he cannot open his mouth to breathe.  Discussed referral to rheumatology given dry mouth, I am not well equipped to further evaluate or treat this.  In addition, given shortness of breath and significant coronary disease last seen by cardiology in 2017 following cath, I think is very important he meet with a cardiologist for further evaluation of his shortness of breath but also for ongoing medication management for coronary artery disease.   HPI at initial visit: Patient reports shortness of breath on the last 6 to months.  Not much issue before that.  Worse on stairs or when hurrying.  No times a day with things are better or worse.  No position makes his better or worse.  No seasonal or environmental factors he can identify that make things better or worse.  No other alleviating or exacerbating factors.  Has not used inhalers.  He endorses a lot of issues with cough, gagging in the night.  Regurgitating, heartburn symptoms very bad.  Following with GI.  Recent endoscopy.  He has a hiatal hernia.  The symptoms seem to be improving with treatment.   He says when he vomits and used to vomit more, it looked like fecal matter.  His chest x-ray 06/2022 personally reviewed interpret as clear lungs bilaterally.  CTA PE protocol 06/2022 personally reviewed and interpreted as no PE, bronchial thickening that is diffuse, otherwise clear lungs.  PMH: Hypertension, CHF, diabetes, CKD, tobacco abuse in remission Surgical history: Neck surgery, PCI times 10/02/2015 Family history: Reviewed, no significant respiratory illness in first-degree relatives Social history: Former smoker, 1 pack a day for 30 years, quit a few years ago, lives in [place] / Pulmonary Flowsheets:   ACT:      No data to display           MMRC:     No data to display           Epworth:      No data to display           Tests:   FENO:  No results found for: "NITRICOXIDE"  PFT:     No data to display           WALK:      No data to display           Imaging: Personally reviewed and as per EMR discussion this note No results found.  Lab Results: Personally  reviewed CBC    Component Value Date/Time   WBC 8.3 06/18/2022 1203   RBC 5.20 06/18/2022 1203   HGB 14.4 06/18/2022 1203   HCT 45.1 06/18/2022 1203   PLT 305 06/18/2022 1203   MCV 86.7 06/18/2022 1203   MCH 27.7 06/18/2022 1203   MCHC 31.9 06/18/2022 1203   RDW 13.1 06/18/2022 1203    BMET    Component Value Date/Time   NA 136 06/18/2022 1203   K 4.1 06/18/2022 1203   CL 104 06/18/2022 1203   CO2 26 06/18/2022 1203   GLUCOSE 307 (H) 06/18/2022 1203   BUN 25 (H) 06/18/2022 1203   CREATININE 1.84 (H) 06/18/2022 1203   CALCIUM 9.5 06/18/2022 1203   GFRNONAA 42 (L) 06/18/2022 1203   GFRAA >60 03/15/2016 1253    BNP    Component Value Date/Time   BNP 19.0 06/18/2022 1203    ProBNP No results found for: "PROBNP"  Specialty Problems   None   No Known Allergies  Immunization History  Administered Date(s) Administered    Influenza,inj,Quad PF,6+ Mos 06/21/2021, 07/15/2022   Moderna Sars-Covid-2 Vaccination 11/18/2019, 12/16/2019   PNEUMOCOCCAL CONJUGATE-20 06/21/2021   Tdap 08/13/2015   Zoster Recombinat (Shingrix) 04/28/2020, 10/25/2020    Past Medical History:  Diagnosis Date   Anginal pain ([place])    Diabetes mellitus    GERD (gastroesophageal reflux disease)    H/O hiatal hernia    Neuromuscular disorder ([place])    MUSCLE SPASMS   Peripheral vascular disease ([place])    PERIPHERAL NEUROPATHY     Tobacco History: Social History   Tobacco Use  Smoking Status Former   Packs/day: 0.50   Types: Cigarettes  Smokeless Tobacco Current   Ready to quit: Not Answered Counseling given: Not Answered   Continue to not smoke  Outpatient Encounter Medications as of 09/19/2022  Medication Sig   aspirin EC 81 MG EC tablet Take 1 tablet (81 mg total) by mouth daily.   atorvastatin (LIPITOR) 10 MG tablet Take 10 mg by mouth daily.   benzonatate (TESSALON PERLES) 100 MG capsule Take 1 capsule (100 mg total) by mouth 3 (three) times daily as needed for cough.   clopidogrel (PLAVIX) 75 MG tablet Take 1 tablet (75 mg total) by mouth daily.   insulin NPH-regular Human (NOVOLIN 70/30) (70-30) 100 UNIT/ML injection Inject 18-20 Units into the skin 2 (two) times daily with a meal.   [DISCONTINUED] albuterol (VENTOLIN HFA) 108 (90 Base) MCG/ACT inhaler Inhale 2 puffs into the lungs every 6 (six) hours as needed for wheezing or shortness of breath.   [DISCONTINUED] Tiotropium Bromide-Olodaterol (STIOLTO RESPIMAT) 2.5-2.5 MCG/ACT AERS Inhale 2 puffs into the lungs daily.   albuterol (VENTOLIN HFA) 108 (90 Base) MCG/ACT inhaler Inhale 2 puffs into the lungs every 6 (six) hours as needed for wheezing or shortness of breath.   No facility-administered encounter medications on file as of 09/19/2022.     Review of Systems  Review of Systems  N/a Physical Exam  BP 134/76 (BP Location: Left Arm, Patient Position: Sitting,  Cuff Size: Normal)   Pulse 100   Ht 6\' 1"  (1.854 m)   Wt 209 lb 3.2 oz (94.9 kg)   SpO2 97%   BMI 27.60 kg/m   Wt Readings from Last 5 Encounters:  09/19/22 209 lb 3.2 oz (94.9 kg)  08/29/22 208 lb 6.4 oz (94.5 kg)  06/18/22 200 lb (90.7 kg)  05/01/16 165 lb (74.8 kg)  03/16/16 163 lb (73.9 kg)  BMI Readings from Last 5 Encounters:  09/19/22 27.60 kg/m  08/29/22 26.76 kg/m  06/18/22 25.68 kg/m  05/01/16 21.18 kg/m  03/16/16 20.93 kg/m     Physical Exam General: Sitting in chair, no acute distress Eyes: EOMI, no icterus Neck: Supple, no JVP appreciated sitting upright Pulmonary: Clear, distant, voice seems weak or strained at times Cardiovascular: Warm, no edema Abdomen: Nondistended, bowel sounds present MSK: No synovitis, no joint effusion Neuro: Normal gait, no weakness Psych: Normal mood, full affect   Assessment & Plan:   Dyspnea on exertion: Present for the about a year.  On further questioning likely longer, really thinks things started with anterior approach to cervical spine surgery which was in 2013.  Suspect multifactorial.  Likely chronic bronchitis in the setting of 30-pack-year smoking history.  He has thickened bronchioles on CT scan.  Also, he clearly endorses what sounds like aspiration events of gastro esophageal reflux at night with coughing and choking gagging.  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  Trial Stiolto without improvement.  PFTs for further evaluation could not be completed as patient had to stop test once nose clip was placed.  Given lack of improvement with inhaler therapy, okay to stop.  Will try albuterol to see if something different helps.  Can use as needed, instructions given.  Given lack of improvement inhaler therapy, referral to cardiology for evaluation of dyspnea on exertion.  Coronary artery disease: Refer to cardiology.  Stent placed in 2017.  Has not followed up with them.  Dry mouth: Unclear  etiology.  Referral to rheumatology for evaluation of Sjogren's etc.   Return in about 3 months (around 12/18/2022).   Lanier Clam, MD 09/19/2022

## 2022-09-23 ENCOUNTER — Telehealth: Payer: Self-pay | Admitting: Pulmonary Disease

## 2022-09-23 NOTE — Telephone Encounter (Signed)
Dr. Silas Flood referred patient to these 2 specialities- can you ladies request the authorization for these?

## 2022-09-24 NOTE — Telephone Encounter (Signed)
I will send the referral for Rheumatology and Cardiology to [facility].  I left pt a vm with my direct # to make him aware and he can call me back if he has questions.  Nothing further needed.

## 2022-12-06 ENCOUNTER — Telehealth: Payer: Self-pay | Admitting: Pulmonary Disease

## 2022-12-06 NOTE — Telephone Encounter (Signed)
Dr. Judeth Horn please advise? Is patient okay to wait for HFU on 5/13?  Or is it okay to scheduled with another provider/NP

## 2022-12-06 NOTE — Telephone Encounter (Signed)
PT was recently hospitalized and is a complex case. NP Dahlia Client, from the [facility], wanted to see if we could do a HFU with him next week if poss.I sched him with Dr. Derwood Kaplan soonest (No NP's avail sooner here) but can we see if Dr. Rexene Edison can see him sooner?  Please call the [facility] Social Work[facility] 1 day in advance at least to resched and have them communicate appt date to PT.  [phone] or [phone]  Ins letter is in place auth him to be seen into June.

## 2022-12-07 NOTE — Telephone Encounter (Signed)
I am sorry I do not have any availability.  Seems like no NP's do either.  Certainly can be added to an NP schedule prior to 5/13 of openings become available.  Or if I have openings in the meantime happy to see him sooner.  Otherwise, would advise patient to follow-up at the [place] in the interim for close monitoring if that is what is desired by the [facility] provider.

## 2022-12-11 NOTE — Telephone Encounter (Signed)
An opening became available with Ames Dura NP on 12/16/2022 at 11 am.  Called the [place] Social worker Peter Minium), left a detailed message about the opening, and our number to call with any questions.  I will leave appointments as they are for now.  AT[facility] patient x2.  Line rang and then went to a busy signal.

## 2022-12-16 ENCOUNTER — Ambulatory Visit (INDEPENDENT_AMBULATORY_CARE_PROVIDER_SITE_OTHER): Payer: No Typology Code available for payment source | Admitting: Primary Care

## 2022-12-16 ENCOUNTER — Encounter: Payer: Self-pay | Admitting: Primary Care

## 2022-12-16 ENCOUNTER — Ambulatory Visit (INDEPENDENT_AMBULATORY_CARE_PROVIDER_SITE_OTHER): Payer: No Typology Code available for payment source

## 2022-12-16 ENCOUNTER — Telehealth: Payer: Self-pay | Admitting: Pulmonary Disease

## 2022-12-16 VITALS — BP 110/70 | HR 87 | Temp 98.5°F | Ht 74.0 in | Wt 211.0 lb

## 2022-12-16 DIAGNOSIS — R0600 Dyspnea, unspecified: Secondary | ICD-10-CM | POA: Diagnosis not present

## 2022-12-16 DIAGNOSIS — Z8669 Personal history of other diseases of the nervous system and sense organs: Secondary | ICD-10-CM | POA: Diagnosis not present

## 2022-12-16 DIAGNOSIS — J690 Pneumonitis due to inhalation of food and vomit: Secondary | ICD-10-CM | POA: Diagnosis not present

## 2022-12-16 MED ORDER — TRELEGY ELLIPTA 100-62.5-25 MCG/ACT IN AEPB: 1.0000 | INHALATION_SPRAY | Freq: Every morning | RESPIRATORY_TRACT | 0 refills | Status: DC

## 2022-12-16 NOTE — Patient Instructions (Addendum)
Recommendations: - Trelegy take one puff daily  - Continue to wear 2L oxygen - Sleep with head elevated, I'd really like to see if you can get a wedge pillow to use at night (if not able to use wedge pillow, its very - important you sleep on your side)   Orders: - PFTs (ordered)  - CXR today (ordered)   Follow-up: - Please keep apt with Dr. Judeth Horn on 5/13 (come 30 mins early for office spirometry)  Aspiration Precautions, Adult Aspiration is when a person breathes in (inhales) a liquid or other material, and it goes into the lungs. Adults who have conditions that affect their brain or spinal cord or who have trouble swallowing, a decreased gag reflex, or trouble moving around (mobility) are at risk for this condition. Things that can be inhaled into the lungs include: Food. Any type of liquid. This includes drinks and saliva. Stomach contents. This includes vomit and stomach acid. This condition can cause an infection in the lungs (pneumonia). Certain steps can be taken to reduce the risk of aspiration. What are the signs of aspiration? Signs may include: Coughing. A person may: Cough after they swallow food or liquids. Cough up mucus (sputum) that is yellow, tan, or green. It may also smell bad or have pieces of food in it. Cough when lying down or have to sit up quickly after lying down. Have a hoarse, barky cough. Trouble breathing. This may include: Breathing very quickly or very slowly. Loud breathing. High-pitched whistling sounds during breathing, most often when breathing out (wheezing). Trouble eating. This may involve: Clearing the throat often while eating. Drooling while eating. Having a feeling of fullness in the throat or like something is stuck in the throat. Having a runny nose and watery eyes while eating. Speaking problems. This may include a hoarse voice or inability to speak. Choking often while eating. Changes in skin color. The skin may look red  or blue. Pain in the chest or back during or right after eating. What problems can aspiration cause? This condition can cause problems such as: Losing weight because the person is not absorbing the nutrients they need. Loss of enjoyment and the social benefits of eating. Choking. Lung irritation. This can happen if someone aspirates acidic food or drinks. Pneumonia. Lung abscess. This is a collection of infected liquid (pus) in the lungs. In serious cases, death can occur. What can I do to prevent aspiration? Caring for someone who has a feeding tube If you are caring for someone who has a feeding tube and cannot eat or drink safely by mouth: Keep the person in an upright position as much as possible. Do not lay the person flat if they get feedings around the clock (continuous feedings). If you need to lay the person flat for any reason, turn the feeding pump off. Check for left over liquid (residuals) in the stomach as told by the health care provider. Ask the health care provider what residual amount is too high. Caring for someone who can eat and drink safely by mouth If you are caring for someone who can eat and drink safely by mouth: Have the person sit in an upright position when they eat or drink. This can be done by: Having the person sit up in a chair. Positioning the person in bed so they are upright. This can be done if sitting in a chair is not possible. Remind the person to eat slowly and chew well. Make sure the person  is awake and alert while eating. Never put food or liquids in the mouth of a person who is not fully alert. Do not distract the person, especially if they have problems with thinking or memory (cognitive problems). Allow foods to cool. Hot foods may be harder to swallow. Provide small meals often rather than three large meals. This may help the person to not feel tired when eating. After the person is done eating: Check their mouth well for leftover food. Keep  them sitting upright for 30-45 minutes. Do not serve food or drink within 2 hours of bedtime. General instructions To prevent aspiration in someone who can eat and drink safely by mouth: Feed small bites of food. Do not force-feed. If the person is on a diet because of problems with swallowing (dysphagia diet), follow the food and drink consistency that the health care provider recommends. You may be told to thicken a liquid using a thickening product. Use as little water as possible when brushing the person's teeth or cleaning their mouth. Provide oral care before and after meals. Use adaptive devices as told by the health care provider. These may include cut-out cups or other utensils. Crush pills and put them in soft food, such as pudding or ice cream. Some pills should not be crushed. Check with the health care provider before crushing any medicine. If the person is choking on food or an object, do abdominal thrusts.  Contact a health care provider if: The person has a feeding tube, and the feeding tube residual amount is too high. The person tries to avoid food or water. They may refuse to eat, drink, or be fed, or they may eat less than normal. The person may have aspirated food or liquid. The person shows warning signs of aspiration. These include choking or coughing when they eat or drink. The person has symptoms of pneumonia. These may include: Coughing a lot or coughing up mucus with a bad smell or blood in it. A long-lasting (chronic) cough. Shortness of breath or wheezing. Chest or back pain. Sweating, fever, or chills. Get help right away if: The person has trouble breathing or starts to breathe fast. The person is breathing very slowly or stops breathing. The person cannot stop choking. The person turns blue, faints, or seems confused. These symptoms may be an emergency. Get help right away. Call 911. Do not wait to see if the symptoms will go away. This information is  not intended to replace advice given to you by your health care provider. Make sure you discuss any questions you have with your health care provider. Document Revised: 01/09/2022 Document Reviewed: 01/09/2022 Elsevier Patient Education  2023 [employer]   Aspiration Pneumonia, Adult  Aspiration pneumonia is an infection that occurs after you breathe a large amount of food, liquid, stomach acid, or saliva into your lungs (pulmonary aspiration). This can cause inflammation and infection in the lungs. It can also make you cough and make it hard to breathe. This condition is serious and can be life-threatening. What are the causes? This condition may be caused by: Breathing the bacteria in food, liquid, stomach acid, or saliva into your lungs. Breathing material such as blood or a foreign body into your lungs. This can cause an infection even if the material does not originally have bacteria on it. What increases the risk? You are more likely to get aspiration pneumonia if you have a condition that makes it hard to breathe, swallow, cough, or gag, such as:  A breathing disorder. This includes chronic obstructive pulmonary disease (COPD). A brain (neurologic) disorder. This may be a stroke, seizures, Parkinson's disease, dementia, amyotrophic lateral sclerosis (ALS), or a brain injury. Gastroesophageal reflux disease (GERD). Esophageal narrowing. This is a narrowing of the tube that carries food to the stomach. You may also be more likely to get this condition if: You are older than age 66 and frail. You are given anesthesia for a procedure. You drink too much alcohol and pass out. If you pass out and vomit, the vomit can be inhaled into your lungs. You take medicines to help you sleep. These include tranquilizers and sedatives. You take poor care of your mouth and teeth. You do not get enough nutrients (are malnourished). You have a weak disease-fighting system (immune system). What are the  signs or symptoms? The main sign of this condition is an episode of choking or coughing while eating or drinking. Other symptoms may include: A cough that does not go away. Trouble breathing. This may include shortness of breath. It may also involve high-pitched whistling sounds when you breathe, most often when you breathe out (wheezing). Fever. Chest pain. Being more tired than usual (fatigue). This condition may also be silent. This means that you may not have any symptoms. How is this diagnosed? This condition may be diagnosed based on a physical exam. You may also have tests, such as: Blood tests. Chest X-ray. Sputum culture. This is when mucus (sputum) is taken from the lungs or from the tubes that carry air to the lungs (bronchi). The sputum is tested for bacteria. Oximetry. This is when a sensor or clip is put on an area such as a finger, earlobe, or toe. It measures the level of oxygen in your blood. Swallowing study. This test looks at how food is swallowed. It is done to see whether food goes into your windpipe (trachea) or esophagus. Bronchoscopy. This test uses a flexible tube (bronchoscope) to see inside your lungs. How is this treated? This condition may be treated with: Medicines. Antibiotics will be given to kill the bacteria that are causing the pneumonia. Other medicines may also be used to reduce fever, pain, or inflammation. Breathing assistance and oxygen therapy. You may need to be given oxygen, or you may need breathing support from a machine (ventilator). Thoracentesis. This is a procedure to remove fluid that has built up in the space between the linings of the chest wall and the lungs. Changes in diet. If you get this condition often, you may need to have a feeding tube placed. This can help give you the nutrients you need. Follow these instructions at home: Medicines Take over-the-counter and prescription medicines as told by your health care provider. Finish  your antibiotics even if you start to feel better. Take cough medicine only if you are losing sleep. Cough medicine can stop your body from being able to remove mucus from your lungs. General instructions Follow instructions from your health care provider about what you may eat and drink. You may need to: Avoid certain food textures. Thicken your liquids. This can lower your risk of getting this condition again. Sleep in a semi-upright position at night. Try to sleep in a reclining chair. You may also place a few pillows under your head in bed. Do not use any products that contain nicotine or tobacco. These products include cigarettes, chewing tobacco, and vaping devices, such as e-cigarettes. If you need help quitting, ask your health care provider. Return to your  normal activities as told by your health care provider. Ask your health care provider what activities are safe for you. Keep all follow-up visits. Your health care provider may need to see how you are healing. Contact a health care provider if: You have a fever. You cough or choke when you eat or drink. You keep having signs or symptoms of this condition. Get help right away if: You have trouble breathing that gets worse. You have chest pain. These symptoms may be an emergency. Get help right away. Call 911. Do not wait to see if symptoms will go away. Do not drive yourself to the hospital. This information is not intended to replace advice given to you by your health care provider. Make sure you discuss any questions you have with your health care provider. Document Revised: 01/09/2022 Document Reviewed: 01/09/2022 Elsevier Patient Education  2023 [employer].

## 2022-12-16 NOTE — Telephone Encounter (Signed)
PT was checking out with [facility] ppwk stating he needs a Nathaniel Colon (PFT BA).  [facility]  Instructions say to keep app w/Dr. Rexene Edison and add the Nathaniel Colon but no PFT appts avail here. (PT has transportation issues and is wheel chair bound so I can not sched PFT  in [place] either) If I was to sched here on the same dayfor the PFT I would have to sched him out to June.,

## 2022-12-16 NOTE — Telephone Encounter (Signed)
So seeking Dr. 's advise to see w/o PFT BA or sched PT out to June/July to sched w/PFT incl. Please advise.

## 2022-12-16 NOTE — Progress Notes (Signed)
@Patient  ID: Nathaniel Colon, male    DOB: 1960-12-31, 62 y.o.   MRN: [id number]  Chief Complaint  Patient presents with   Hospitalization Follow-up    Dyspnea persistent.    Referring provider: No ref. provider found  HPI:  62 y.o. man whom we are seeing for evaluation of dyspnea on exertion.    Overall, no change in symptoms.  Was prescribed Stiolto at last visit.  He does not think it helped.  PFTs were ordered for further evaluation.  Scheduled for today.  He cannot complete them.  Did not attempt.  Suspect he felt claustrophobic he has a history of the same.  His chief complaint is dry mouth.  Feels he cannot open his mouth as the force of his lips at time.  He thinks SMA raising short of breath but he cannot open his mouth to breathe.  Discussed referral to rheumatology given dry mouth, I am not well equipped to further evaluate or treat this.  In addition, given shortness of breath and significant coronary disease last seen by cardiology in 2017 following cath, I think is very important he meet with a cardiologist for further evaluation of his shortness of breath but also for ongoing medication management for coronary artery disease.   HPI at initial visit: Patient reports shortness of breath on the last 6 to months.  Not much issue before that.  Worse on stairs or when hurrying.  No times a day with things are better or worse.  No position makes his better or worse.  No seasonal or environmental factors he can identify that make things better or worse.  No other alleviating or exacerbating factors.  Has not used inhalers.  He endorses a lot of issues with cough, gagging in the night.  Regurgitating, heartburn symptoms very bad.  Following with GI.  Recent endoscopy.  He has a hiatal hernia.  The symptoms seem to be improving with treatment.  He says when he vomits and used to vomit more, it looked like fecal matter.  His chest x-ray 06/2022 personally reviewed interpret as clear  lungs bilaterally.  CTA PE protocol 06/2022 personally reviewed and interpreted as no PE, bronchial thickening that is diffuse, otherwise clear lungs.  PMH: Hypertension, CHF, diabetes, CKD, tobacco abuse in remission Surgical history: Neck surgery, PCI times 10/02/2015 Family history: Reviewed, no significant respiratory illness in first-degree relatives Social history: Former smoker, 1 pack a day for 30 years, quit a few years ago, lives in [place]    Dyspnea on exertion: Present for the about a year.  On further questioning likely longer, really thinks things started with anterior approach to cervical spine surgery which was in 2013.  Suspect multifactorial.  Likely chronic bronchitis in the setting of 30-pack-year smoking history.  He has thickened bronchioles on CT scan.  Also, he clearly endorses what sounds like aspiration events of gastro esophageal reflux at night with coughing and choking gagging.  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  Trial Stiolto without improvement.  PFTs for further evaluation could not be completed as patient had to stop test once nose clip was placed.  Given lack of improvement with inhaler therapy, okay to stop.  Will try albuterol to see if something different helps.  Can use as needed, instructions given.  Given lack of improvement inhaler therapy, referral to cardiology for evaluation of dyspnea on exertion.   Coronary artery disease: Refer to cardiology.  Stent placed in 2017.  Has not followed up  with them.   Dry mouth: Unclear etiology.  Referral to rheumatology for evaluation of Sjogren's etc.     Return in about 3 months (around 12/18/2022).  12/16/2022- interim hx  Patient presents today for hospital follow-up.  Past medical history significant for heart failure with preserved ejection fraction, coronary artery status post stenting, type 2 diabetes insulin-dependent, hypertension, hyperlipidemia, OSA, chronic kidney disease. Patient  follows with Dr. Judeth Horn for dyspnea symptoms, he was last seen on 09/19/22.  He was unable to complete PFTs.  Sample of Stiolto did not help his breathing much.  He was recently admitted to [facility]  He originally presented to urgent care on 11/13/2022.  He was found to be in acute hypoxic respiratory failure secondary to pneumonia treated with antibiotics and supplemental oxygen.  He also had acute kidney injury that improved with fluids.  CT of his head was negative for acute pathology, CT abdomen pelvis was also negative.  MRI of spine showed degenerative changes but no focal lesion on spinal cord.  Patient was noted to have ataxic gait.  Needing assistant with mobility and activities of daily living, following with PT and OT.  He follows with neurology in [facility] for tremors.  Patient has noticed increased weakness over the last few months, no longer working.  He was on modified job duties and then workplace shutdown in December.  Fatigues easily.  Reports frequent falls at home due to imbalance.   Patient experiences chest tightness, dyspnea symptoms and intermittent cough. Associated PND. He has dry mouth all the time. Using flonase and saline on occasional. He gets short winded when walking 20-30 ft. He is currently at short stay rehab through the [place]. Scheduled for open MRI brain on 01/03/2023. He has trouble swallowing at times. Following with speech therapy due to aspiration pneumonia. Patient has severe sleep apnea but unable to tolerate CPAP due to claustrophobia.    No Known Allergies  Immunization History  Administered Date(s) Administered   Influenza,inj,Quad PF,6+ Mos 06/21/2021, 07/15/2022   Moderna Sars-Covid-2 Vaccination 11/18/2019, 12/16/2019   PNEUMOCOCCAL CONJUGATE-20 06/21/2021   Tdap 08/13/2015   Zoster Recombinat (Shingrix) 04/28/2020, 10/25/2020    Past Medical History:  Diagnosis Date   Anginal pain (HCC)    Diabetes mellitus    GERD (gastroesophageal  reflux disease)    H/O hiatal hernia    Neuromuscular disorder (HCC)    MUSCLE SPASMS   Peripheral vascular disease (HCC)    PERIPHERAL NEUROPATHY     Tobacco History: Social History   Tobacco Use  Smoking Status Former   Packs/day: 0.50   Years: 30.00   Additional pack years: 0.00   Total pack years: 15.00   Types: Cigarettes   Quit date: 2018   Years since quitting: 6.3  Smokeless Tobacco Current   Ready to quit: Not Answered Counseling given: Not Answered   Outpatient Medications Prior to Visit  Medication Sig Dispense Refill   albuterol (VENTOLIN HFA) 108 (90 Base) MCG/ACT inhaler Inhale 2 puffs into the lungs every 6 (six) hours as needed for wheezing or shortness of breath. 8 g 3   amLODipine (NORVASC) 5 MG tablet Take 5 mg by mouth daily.     aspirin EC 81 MG EC tablet Take 1 tablet (81 mg total) by mouth daily. 30 tablet 0   atorvastatin (LIPITOR) 10 MG tablet Take 10 mg by mouth daily.     carboxymethylcellulose (REFRESH PLUS) 0.5 % SOLN Place 1 drop into both eyes 3 (three) times daily  as needed.     Continuous Glucose Sensor (FREESTYLE LIBRE 3 SENSOR) MISC by Does not apply route.     gabapentin (NEURONTIN) 300 MG capsule Take 300 mg by mouth 3 (three) times daily.     Insulin Aspart FlexPen (NOVOLOG) 100 UNIT/ML Inject 35 Units into the skin in the morning, at noon, and at bedtime. Inject 35-50 UNITS subcutaneous three times a day before meals     insulin glargine (LANTUS) 100 UNIT/ML Solostar Pen Inject 70 Units into the skin daily.     insulin NPH-regular Human (NOVOLIN 70/30) (70-30) 100 UNIT/ML injection Inject 18-20 Units into the skin 2 (two) times daily with a meal.     Insulin Syringe-Needle U-100 (GLUCOPRO INSULIN SYRINGE) 31G X 5/16" 1 ML MISC by Does not apply route.     metFORMIN (GLUCOPHAGE) 1000 MG tablet Take 1,000 mg by mouth daily with breakfast.     metoCLOPramide (REGLAN) 10 MG tablet Take 5 mg by mouth daily. One-half tablet daily before largest  meal or one that gives the most relief     Tiotropium Bromide-Olodaterol 2.5-2.5 MCG/ACT AERS Inhale 2 each into the lungs daily.     benzonatate (TESSALON PERLES) 100 MG capsule Take 1 capsule (100 mg total) by mouth 3 (three) times daily as needed for cough. (Patient not taking: Reported on 12/16/2022) 30 capsule 0   clopidogrel (PLAVIX) 75 MG tablet Take 1 tablet (75 mg total) by mouth daily. (Patient not taking: Reported on 12/16/2022) 90 tablet 4   No facility-administered medications prior to visit.   Review of Systems  Review of Systems  Constitutional:  Positive for fatigue.  HENT: Negative.    Respiratory:  Positive for cough, chest tightness and shortness of breath.   Neurological:  Positive for weakness.     Physical Exam  BP 110/70 (BP Location: Left Arm, Patient Position: Sitting, Cuff Size: Normal)   Pulse 87   Temp 98.5 F (36.9 C) (Oral)   Ht 6\' 2"  (1.88 m)   Wt 211 lb (95.7 kg)   SpO2 97%   BMI 27.09 kg/m  Physical Exam Constitutional:      General: He is not in acute distress.    Appearance: He is not ill-appearing.  HENT:     Head: Normocephalic and atraumatic.  Cardiovascular:     Rate and Rhythm: Normal rate.  Pulmonary:     Effort: Pulmonary effort is normal.     Breath sounds: No wheezing or rales.  Musculoskeletal:     Comments: In [facility]  Neurological:     Mental Status: He is alert and oriented to person, place, and time.     Motor: Weakness present.     Gait: Gait abnormal.     Comments: Slow speech   Psychiatric:        Mood and Affect: Mood normal.        Behavior: Behavior normal.        Thought Content: Thought content normal.        Judgment: Judgment normal.      Lab Results:  CBC    Component Value Date/Time   WBC 8.3 06/18/2022 1203   RBC 5.20 06/18/2022 1203   HGB 14.4 06/18/2022 1203   HCT 45.1 06/18/2022 1203   PLT 305 06/18/2022 1203   MCV 86.7 06/18/2022 1203   MCH 27.7 06/18/2022 1203   MCHC 31.9 06/18/2022 1203   RDW  13.1 06/18/2022 1203    BMET    Component Value Date/Time  NA 136 06/18/2022 1203   K 4.1 06/18/2022 1203   CL 104 06/18/2022 1203   CO2 26 06/18/2022 1203   GLUCOSE 307 (H) 06/18/2022 1203   BUN 25 (H) 06/18/2022 1203   CREATININE 1.84 (H) 06/18/2022 1203   CALCIUM 9.5 06/18/2022 1203   GFRNONAA 42 (L) 06/18/2022 1203   GFRAA >60 03/15/2016 1253    BNP    Component Value Date/Time   BNP 19.0 06/18/2022 1203    ProBNP No results found for: "PROBNP"  Imaging: DG Chest 2 View  Result Date: 12/16/2022 CLINICAL DATA:  Aspiration pneumonia. EXAM: CHEST - 2 VIEW COMPARISON:  06/18/2022 FINDINGS: Heart size and mediastinal contours are unremarkable. There is no pleural fluid or airspace disease. Lungs appear hyperinflated. Central airway thickening noted. IMPRESSION: 1. No evidence for pneumonia. 2. Central airway thickening and hyperinflation which may reflect underlying COPD or emphysema. Electronically Signed   By: Signa Kell M.D.   On: 12/16/2022 12:17     Assessment & Plan:   Dyspnea - Patient agreeing to re-try PFTs, needs basic spirometry - Trial Trelegy take one puff daily  - Continue to wear 2L oxygen  Orders: - PFTs (ordered)  - CXR today (ordered)   Follow-up: - Please keep apt with Dr. Judeth Horn on 5/13 (come 30 mins early for office spirometry)  Hx of sleep apnea - Intolerant to CPAP - Continue nocturnal oxygen  - Advised patient sleep with head elevated by using wedge pillow to use at night    Glenford Bayley, NP 12/30/2022

## 2022-12-16 NOTE — Progress Notes (Signed)
He was seen for hospital follow-up today for aspiration pneumonia. Currently in rehab, Please let patient know CXR showed no evidence of pneumonia. Evidence of COPD/emphysema. I gave him trelegy sample

## 2022-12-18 ENCOUNTER — Ambulatory Visit: Payer: No Typology Code available for payment source | Admitting: Pulmonary Disease

## 2022-12-18 NOTE — Telephone Encounter (Signed)
Called again and received busy signal after line rang multiple times.

## 2022-12-19 NOTE — Telephone Encounter (Signed)
He's a complicated case and was recently hospitalized. Has a lot going so I'd like him to keep apt on May 13th with Dr. Judeth Horn, if we are able to use new office spirometry that day for a baseline breathing test that would be preferred vs full PFT which he likely can not do (he has tried before and has issues)

## 2022-12-19 NOTE — Telephone Encounter (Signed)
Beth, please advise on this for pt if you want to leave appt as scheduled for pt with Dr. Judeth Horn or if you want this to be rescheduled to have both the PFT and OV on the same day.

## 2022-12-19 NOTE — Telephone Encounter (Signed)
Noted. Nothing further needed. 

## 2022-12-23 ENCOUNTER — Encounter: Payer: Self-pay | Admitting: Pulmonary Disease

## 2022-12-23 ENCOUNTER — Ambulatory Visit (INDEPENDENT_AMBULATORY_CARE_PROVIDER_SITE_OTHER): Payer: No Typology Code available for payment source | Admitting: Pulmonary Disease

## 2022-12-23 VITALS — BP 120/80 | HR 85 | Ht 74.0 in | Wt 212.4 lb

## 2022-12-23 DIAGNOSIS — R0609 Other forms of dyspnea: Secondary | ICD-10-CM | POA: Diagnosis not present

## 2022-12-23 NOTE — Patient Instructions (Signed)
Nice to see you again  No changes to medications  I asked that an albuterol treatment be given to you prior to bedtime  Asked for the albuterol at other times if you feel short of breath, or when you lay down during the day and feel short of breath see if this helps.  Continue Trelegy 1 puff once a day  I think a neurology opinion and diagnosis and treatment can be very important to help with the leg weakness but also I worry about weakness of the breathing muscles causing some shortness of breath  Return to clinic in 6 months or sooner as needed with Dr. Judeth Horn

## 2022-12-23 NOTE — Progress Notes (Signed)
@Patient  ID: Nathaniel Colon, male    DOB: 1961-08-12, 62 y.o.   MRN: [id number]  Chief Complaint  Patient presents with   Follow-up    F/up on LOV    Referring provider: No ref. provider found  HPI:   62 y.o. man whom we are seeing for evaluation of dyspnea on exertion and hospital follow-up for pneumonia and acute hypoxemic respiratory failure here.  Multiple visits through the [place] system evaluated via paper records.  Hospitalization at [facility], multiple notes reviewed.  In the past, no real improvement with dual bronchodilator therapy.  Inability to perform PFTs due to claustrophobia, inability to tolerate nose clip.  Was hospitalized a few weeks ago.  With hypoxemic.  Imaging revealed left-sided pneumonia.  History of antibiotics.  He also showed signs symptoms of neuromuscular weakness particular in the lower extremities.  He was discharged to rehab facility.  Since then has been weaned off daytime oxygen.  Using nocturnal oxygen.  He has a history of OSA but unable to tolerate mask, CPAP due to claustrophobia.  He continues to feel weak.  Primary in the legs.  He does describe shortness of breath when lying supine.  Admittedly not totally reproducible every time.  But frequently.   HPI at initial visit: Patient reports shortness of breath on the last 6 to months.  Not much issue before that.  Worse on stairs or when hurrying.  No times a day with things are better or worse.  No position makes his better or worse.  No seasonal or environmental factors he can identify that make things better or worse.  No other alleviating or exacerbating factors.  Has not used inhalers.  He endorses a lot of issues with cough, gagging in the night.  Regurgitating, heartburn symptoms very bad.  Following with GI.  Recent endoscopy.  He has a hiatal hernia.  The symptoms seem to be improving with treatment.  He says when he vomits and used to vomit more, it looked like fecal matter.  His chest x-ray  06/2022 personally reviewed interpret as clear lungs bilaterally.  CTA PE protocol 06/2022 personally reviewed and interpreted as no PE, bronchial thickening that is diffuse, otherwise clear lungs.  PMH: Hypertension, CHF, diabetes, CKD, tobacco abuse in remission Surgical history: Neck surgery, PCI times 10/02/2015 Family history: Reviewed, no significant respiratory illness in first-degree relatives Social history: Former smoker, 1 pack a day for 30 years, quit a few years ago, lives in [place] / Pulmonary Flowsheets:   ACT:      No data to display          MMRC:     No data to display          Epworth:      No data to display          Tests:   FENO:  No results found for: "NITRICOXIDE"  PFT:     No data to display          WALK:      No data to display          Imaging: Personally reviewed and as per EMR discussion this note DG Chest 2 View  Result Date: 12/16/2022 CLINICAL DATA:  Aspiration pneumonia. EXAM: CHEST - 2 VIEW COMPARISON:  06/18/2022 FINDINGS: Heart size and mediastinal contours are unremarkable. There is no pleural fluid or airspace disease. Lungs appear hyperinflated. Central airway thickening noted. IMPRESSION: 1. No evidence for pneumonia. 2. Central airway  thickening and hyperinflation which may reflect underlying COPD or emphysema. Electronically Signed   By: Signa Kell M.D.   On: 12/16/2022 12:17    Lab Results: Personally reviewed CBC    Component Value Date/Time   WBC 8.3 06/18/2022 1203   RBC 5.20 06/18/2022 1203   HGB 14.4 06/18/2022 1203   HCT 45.1 06/18/2022 1203   PLT 305 06/18/2022 1203   MCV 86.7 06/18/2022 1203   MCH 27.7 06/18/2022 1203   MCHC 31.9 06/18/2022 1203   RDW 13.1 06/18/2022 1203    BMET    Component Value Date/Time   NA 136 06/18/2022 1203   K 4.1 06/18/2022 1203   CL 104 06/18/2022 1203   CO2 26 06/18/2022 1203   GLUCOSE 307 (H) 06/18/2022 1203   BUN 25 (H)  06/18/2022 1203   CREATININE 1.84 (H) 06/18/2022 1203   CALCIUM 9.5 06/18/2022 1203   GFRNONAA 42 (L) 06/18/2022 1203   GFRAA >60 03/15/2016 1253    BNP    Component Value Date/Time   BNP 19.0 06/18/2022 1203    ProBNP No results found for: "PROBNP"  Specialty Problems   None   No Known Allergies  Immunization History  Administered Date(s) Administered   Influenza,inj,Quad PF,6+ Mos 06/21/2021, 07/15/2022   Moderna Sars-Covid-2 Vaccination 11/18/2019, 12/16/2019   PNEUMOCOCCAL CONJUGATE-20 06/21/2021   Tdap 08/13/2015   Zoster Recombinat (Shingrix) 04/28/2020, 10/25/2020    Past Medical History:  Diagnosis Date   Anginal pain (HCC)    Diabetes mellitus    GERD (gastroesophageal reflux disease)    H/O hiatal hernia    Neuromuscular disorder (HCC)    MUSCLE SPASMS   Peripheral vascular disease (HCC)    PERIPHERAL NEUROPATHY     Tobacco History: Social History   Tobacco Use  Smoking Status Former   Packs/day: 0.50   Years: 30.00   Additional pack years: 0.00   Total pack years: 15.00   Types: Cigarettes   Quit date: 2018   Years since quitting: 6.3  Smokeless Tobacco Current   Ready to quit: Not Answered Counseling given: Not Answered   Continue to not smoke  Outpatient Encounter Medications as of 12/23/2022  Medication Sig   albuterol (VENTOLIN HFA) 108 (90 Base) MCG/ACT inhaler Inhale 2 puffs into the lungs every 6 (six) hours as needed for wheezing or shortness of breath.   amLODipine (NORVASC) 5 MG tablet Take 5 mg by mouth daily.   aspirin EC 81 MG EC tablet Take 1 tablet (81 mg total) by mouth daily.   atorvastatin (LIPITOR) 10 MG tablet Take 10 mg by mouth daily.   carboxymethylcellulose (REFRESH PLUS) 0.5 % SOLN Place 1 drop into both eyes 3 (three) times daily as needed.   Continuous Glucose Sensor (FREESTYLE LIBRE 3 SENSOR) MISC by Does not apply route.   Fluticasone-Umeclidin-Vilant (TRELEGY ELLIPTA) 100-62.5-25 MCG/ACT AEPB Inhale 1 puff  into the lungs in the morning.   gabapentin (NEURONTIN) 300 MG capsule Take 300 mg by mouth 3 (three) times daily.   Insulin Aspart FlexPen (NOVOLOG) 100 UNIT/ML Inject 35 Units into the skin in the morning, at noon, and at bedtime. Inject 35-50 UNITS subcutaneous three times a day before meals   insulin glargine (LANTUS) 100 UNIT/ML Solostar Pen Inject 70 Units into the skin daily.   insulin NPH-regular Human (NOVOLIN 70/30) (70-30) 100 UNIT/ML injection Inject 18-20 Units into the skin 2 (two) times daily with a meal.   Insulin Syringe-Needle U-100 (GLUCOPRO INSULIN SYRINGE) 31G X 5/16" 1  ML MISC by Does not apply route.   metFORMIN (GLUCOPHAGE) 1000 MG tablet Take 1,000 mg by mouth daily with breakfast.   metoCLOPramide (REGLAN) 10 MG tablet Take 5 mg by mouth daily. One-half tablet daily before largest meal or one that gives the most relief   Tiotropium Bromide-Olodaterol 2.5-2.5 MCG/ACT AERS Inhale 2 each into the lungs daily.   benzonatate (TESSALON PERLES) 100 MG capsule Take 1 capsule (100 mg total) by mouth 3 (three) times daily as needed for cough. (Patient not taking: Reported on 12/16/2022)   clopidogrel (PLAVIX) 75 MG tablet Take 1 tablet (75 mg total) by mouth daily. (Patient not taking: Reported on 12/16/2022)   No facility-administered encounter medications on file as of 12/23/2022.     Review of Systems  Review of Systems  N/a Physical Exam  BP 120/80 (BP Location: Left Arm)   Pulse 85   Ht 6\' 2"  (1.88 m)   Wt 212 lb 6.4 oz (96.3 kg)   SpO2 95%   BMI 27.27 kg/m   Wt Readings from Last 5 Encounters:  12/23/22 212 lb 6.4 oz (96.3 kg)  12/16/22 211 lb (95.7 kg)  09/19/22 209 lb 3.2 oz (94.9 kg)  08/29/22 208 lb 6.4 oz (94.5 kg)  06/18/22 200 lb (90.7 kg)    BMI Readings from Last 5 Encounters:  12/23/22 27.27 kg/m  12/16/22 27.09 kg/m  09/19/22 27.60 kg/m  08/29/22 26.76 kg/m  06/18/22 25.68 kg/m     Physical Exam General: Sitting in chair, no acute  distress Eyes: EOMI, no icterus Neck: Supple, no JVP appreciated sitting upright Pulmonary: Clear, distant, voice seems weak or strained at times Cardiovascular: Warm, no edema Abdomen: Nondistended, bowel sounds present MSK: No synovitis, no joint effusion Neuro: In wheelchair, strength of flexion and extension both hips knee and foot 3+, significantly weak but symmetric bilaterally Psych: Normal mood, full affect   Assessment & Plan:   Dyspnea on exertion: Present for the about a year.  On further questioning likely longer, really thinks things started with anterior approach to cervical spine surgery which was in 2013.  Suspect multifactorial.  Likely chronic bronchitis in the setting of 30-pack-year smoking history.  He has thickened bronchioles on CT scan.  Also, he clearly endorses what sounds like aspiration events of gastro esophageal reflux at night with coughing and choking gagging.  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  Trial Stiolto without improvement.  PFTs for further evaluation could not be completed as patient had to stop test once nose clip was placed.  With recent hospitalization, was escalated to Trelegy.  He finds this helps somewhat.  Given his neuromuscular weakness on exam of the lower extremities, his description of shortness of breath when lying supine, worry about neuromuscular weakness.  Admittedly, would expect this to be reliably reproducible which sounds like it is not, it is intermittent.  I think we need neurology's input and further evaluation for diagnosis and treatment.  Worried this will get worse.  Unfortunate, I have no way to help with this.  Reviewed recent cardiology evaluation which showed no ischemia on perfusion scan, normal EF on TTE despite decreased EF on perfusion scan.  No further workup recommended from their standpoint.   Return in about 6 months (around 06/25/2023).   Karren Burly, MD 12/23/2022  I spent 40 minutes  in the care of the patient including face-to-face visit, review of records, coordination of care.

## 2022-12-24 ENCOUNTER — Telehealth: Payer: Self-pay | Admitting: Pulmonary Disease

## 2022-12-24 NOTE — Telephone Encounter (Signed)
Went over CXR results with patient. He verbalized understanding. NFN 

## 2022-12-24 NOTE — Telephone Encounter (Signed)
Patient is returning phone call. Patient phone number is [phone].

## 2022-12-30 DIAGNOSIS — R0600 Dyspnea, unspecified: Secondary | ICD-10-CM | POA: Insufficient documentation

## 2022-12-30 DIAGNOSIS — Z8669 Personal history of other diseases of the nervous system and sense organs: Secondary | ICD-10-CM | POA: Insufficient documentation

## 2022-12-30 NOTE — Assessment & Plan Note (Signed)
-   Intolerant to CPAP - Continue nocturnal oxygen  - Advised patient sleep with head elevated by using wedge pillow to use at night

## 2022-12-30 NOTE — Assessment & Plan Note (Signed)
-   Patient agreeing to re-try PFTs, needs basic spirometry - Trial Trelegy take one puff daily  - Continue to wear 2L oxygen  Orders: - PFTs (ordered)  - CXR today (ordered)   Follow-up: - Please keep apt with Dr. Judeth Horn on 5/13 (come 30 mins early for office spirometry)

## 2023-04-15 ENCOUNTER — Emergency Department (HOSPITAL_COMMUNITY): Payer: No Typology Code available for payment source

## 2023-04-15 ENCOUNTER — Inpatient Hospital Stay (HOSPITAL_COMMUNITY)
Admission: EM | Admit: 2023-04-15 | Discharge: 2023-04-17 | DRG: 690 | Disposition: A | Discharge status: Home / Self Care | Payer: No Typology Code available for payment source | Attending: Internal Medicine | Admitting: Internal Medicine

## 2023-04-15 ENCOUNTER — Other Ambulatory Visit: Payer: Self-pay

## 2023-04-15 ENCOUNTER — Encounter (HOSPITAL_COMMUNITY): Payer: Self-pay

## 2023-04-15 DIAGNOSIS — J9611 Chronic respiratory failure with hypoxia: Secondary | ICD-10-CM | POA: Diagnosis not present

## 2023-04-15 DIAGNOSIS — I130 Hypertensive heart and chronic kidney disease with heart failure and stage 1 through stage 4 chronic kidney disease, or unspecified chronic kidney disease: Secondary | ICD-10-CM | POA: Diagnosis present

## 2023-04-15 DIAGNOSIS — G629 Polyneuropathy, unspecified: Secondary | ICD-10-CM

## 2023-04-15 DIAGNOSIS — I2510 Atherosclerotic heart disease of native coronary artery without angina pectoris: Secondary | ICD-10-CM | POA: Diagnosis present

## 2023-04-15 DIAGNOSIS — Z1152 Encounter for screening for COVID-19: Secondary | ICD-10-CM

## 2023-04-15 DIAGNOSIS — N390 Urinary tract infection, site not specified: Secondary | ICD-10-CM | POA: Diagnosis present

## 2023-04-15 DIAGNOSIS — Z955 Presence of coronary angioplasty implant and graft: Secondary | ICD-10-CM

## 2023-04-15 DIAGNOSIS — N3000 Acute cystitis without hematuria: Secondary | ICD-10-CM | POA: Diagnosis not present

## 2023-04-15 DIAGNOSIS — E1142 Type 2 diabetes mellitus with diabetic polyneuropathy: Secondary | ICD-10-CM | POA: Diagnosis present

## 2023-04-15 DIAGNOSIS — Z7982 Long term (current) use of aspirin: Secondary | ICD-10-CM

## 2023-04-15 DIAGNOSIS — Z91148 Patient's other noncompliance with medication regimen for other reason: Secondary | ICD-10-CM

## 2023-04-15 DIAGNOSIS — Z79899 Other long term (current) drug therapy: Secondary | ICD-10-CM

## 2023-04-15 DIAGNOSIS — E1122 Type 2 diabetes mellitus with diabetic chronic kidney disease: Secondary | ICD-10-CM | POA: Insufficient documentation

## 2023-04-15 DIAGNOSIS — K8020 Calculus of gallbladder without cholecystitis without obstruction: Secondary | ICD-10-CM | POA: Diagnosis present

## 2023-04-15 DIAGNOSIS — E860 Dehydration: Secondary | ICD-10-CM | POA: Diagnosis present

## 2023-04-15 DIAGNOSIS — E871 Hypo-osmolality and hyponatremia: Secondary | ICD-10-CM | POA: Diagnosis present

## 2023-04-15 DIAGNOSIS — F329 Major depressive disorder, single episode, unspecified: Secondary | ICD-10-CM | POA: Diagnosis present

## 2023-04-15 DIAGNOSIS — N200 Calculus of kidney: Secondary | ICD-10-CM | POA: Diagnosis present

## 2023-04-15 DIAGNOSIS — E1165 Type 2 diabetes mellitus with hyperglycemia: Secondary | ICD-10-CM | POA: Diagnosis present

## 2023-04-15 DIAGNOSIS — I700 Atherosclerosis of aorta: Secondary | ICD-10-CM | POA: Diagnosis present

## 2023-04-15 DIAGNOSIS — N400 Enlarged prostate without lower urinary tract symptoms: Secondary | ICD-10-CM | POA: Insufficient documentation

## 2023-04-15 DIAGNOSIS — Z7902 Long term (current) use of antithrombotics/antiplatelets: Secondary | ICD-10-CM

## 2023-04-15 DIAGNOSIS — Z8679 Personal history of other diseases of the circulatory system: Secondary | ICD-10-CM

## 2023-04-15 DIAGNOSIS — Z87891 Personal history of nicotine dependence: Secondary | ICD-10-CM

## 2023-04-15 DIAGNOSIS — Z794 Long term (current) use of insulin: Secondary | ICD-10-CM

## 2023-04-15 DIAGNOSIS — E119 Type 2 diabetes mellitus without complications: Secondary | ICD-10-CM

## 2023-04-15 DIAGNOSIS — M62838 Other muscle spasm: Secondary | ICD-10-CM | POA: Diagnosis present

## 2023-04-15 DIAGNOSIS — E7800 Pure hypercholesterolemia, unspecified: Secondary | ICD-10-CM | POA: Insufficient documentation

## 2023-04-15 DIAGNOSIS — T383X6A Underdosing of insulin and oral hypoglycemic [antidiabetic] drugs, initial encounter: Secondary | ICD-10-CM | POA: Diagnosis present

## 2023-04-15 DIAGNOSIS — I10 Essential (primary) hypertension: Secondary | ICD-10-CM | POA: Diagnosis not present

## 2023-04-15 DIAGNOSIS — I5022 Chronic systolic (congestive) heart failure: Secondary | ICD-10-CM | POA: Insufficient documentation

## 2023-04-15 DIAGNOSIS — K219 Gastro-esophageal reflux disease without esophagitis: Secondary | ICD-10-CM | POA: Insufficient documentation

## 2023-04-15 DIAGNOSIS — R739 Hyperglycemia, unspecified: Secondary | ICD-10-CM | POA: Insufficient documentation

## 2023-04-15 DIAGNOSIS — G4733 Obstructive sleep apnea (adult) (pediatric): Secondary | ICD-10-CM | POA: Diagnosis present

## 2023-04-15 DIAGNOSIS — Z7984 Long term (current) use of oral hypoglycemic drugs: Secondary | ICD-10-CM

## 2023-04-15 DIAGNOSIS — I739 Peripheral vascular disease, unspecified: Secondary | ICD-10-CM | POA: Insufficient documentation

## 2023-04-15 DIAGNOSIS — N1831 Chronic kidney disease, stage 3a: Secondary | ICD-10-CM | POA: Diagnosis present

## 2023-04-15 DIAGNOSIS — E1151 Type 2 diabetes mellitus with diabetic peripheral angiopathy without gangrene: Secondary | ICD-10-CM | POA: Diagnosis present

## 2023-04-15 DIAGNOSIS — E11319 Type 2 diabetes mellitus with unspecified diabetic retinopathy without macular edema: Secondary | ICD-10-CM | POA: Diagnosis present

## 2023-04-15 LAB — COMPREHENSIVE METABOLIC PANEL
ALT: 20 U/L (ref 0–44)
AST: 19 U/L (ref 15–41)
Albumin: 3.5 g/dL (ref 3.5–5.0)
Alkaline Phosphatase: 107 U/L (ref 38–126)
Anion gap: 10 (ref 5–15)
BUN: 23 mg/dL — ABNORMAL HIGH (ref 6–20)
CO2: 22 mmol/L (ref 22–32)
Calcium: 8.7 mg/dL — ABNORMAL LOW (ref 8.9–10.3)
Chloride: 100 mmol/L (ref 98–111)
Creatinine, Ser: 1.78 mg/dL — ABNORMAL HIGH (ref 0.61–1.24)
GFR, Estimated: 43 mL/min — ABNORMAL LOW (ref >60)
Glucose, Bld: 403 mg/dL — ABNORMAL HIGH (ref 70–99)
Potassium: 4.7 mmol/L (ref 3.5–5.1)
Sodium: 132 mmol/L — ABNORMAL LOW (ref 135–145)
Total Bilirubin: 1.2 mg/dL (ref 0.3–1.2)
Total Protein: 7.2 g/dL (ref 6.5–8.1)

## 2023-04-15 LAB — CBC
HCT: 46.6 % (ref 39.0–52.0)
Hemoglobin: 14.8 g/dL (ref 13.0–17.0)
MCH: 27.8 pg (ref 26.0–34.0)
MCHC: 31.8 g/dL (ref 30.0–36.0)
MCV: 87.6 fL (ref 80.0–100.0)
Platelets: 247 10*3/uL (ref 150–400)
RBC: 5.32 MIL/uL (ref 4.22–5.81)
RDW: 14.6 % (ref 11.5–15.5)
WBC: 13.2 10*3/uL — ABNORMAL HIGH (ref 4.0–10.5)
nRBC: 0 % (ref 0.0–0.2)

## 2023-04-15 LAB — URINALYSIS, ROUTINE W REFLEX MICROSCOPIC
Bilirubin Urine: NEGATIVE
Glucose, UA: >=500 mg/dL — AB
Ketones, ur: 5 mg/dL — AB
Nitrite: NEGATIVE
Protein, ur: 30 mg/dL — AB
Specific Gravity, Urine: 1.024 (ref 1.005–1.030)
WBC, UA: >50 WBC/hpf (ref 0–5)
pH: 6 (ref 5.0–8.0)

## 2023-04-15 LAB — LIPASE, BLOOD: Lipase: 22 U/L (ref 11–51)

## 2023-04-15 LAB — RESP PANEL BY RT-PCR (RSV, FLU A&B, COVID)  RVPGX2
Influenza A by PCR: NEGATIVE
Influenza B by PCR: NEGATIVE
Resp Syncytial Virus by PCR: NEGATIVE
SARS Coronavirus 2 by RT PCR: NEGATIVE

## 2023-04-15 LAB — HIV ANTIBODY (ROUTINE TESTING W REFLEX): HIV Screen 4th Generation wRfx: NONREACTIVE

## 2023-04-15 LAB — HEMOGLOBIN A1C
Hgb A1c MFr Bld: 10.4 % — ABNORMAL HIGH (ref 4.8–5.6)
Mean Plasma Glucose: 251.78 mg/dL

## 2023-04-15 LAB — CBG MONITORING, ED: Glucose-Capillary: 248 mg/dL — ABNORMAL HIGH (ref 70–99)

## 2023-04-15 MED ORDER — AMLODIPINE BESYLATE 5 MG PO TABS
5.0000 mg | ORAL_TABLET | Freq: Every day | ORAL | Status: DC
  Administered 2023-04-16 – 2023-04-17 (×2): 5 mg via ORAL
  Filled 2023-04-15 (×2): qty 1

## 2023-04-15 MED ORDER — ONDANSETRON HCL 4 MG PO TABS: 4.0000 mg | ORAL_TABLET | Freq: Four times a day (QID) | ORAL | Status: DC | PRN

## 2023-04-15 MED ORDER — ENOXAPARIN SODIUM 40 MG/0.4ML IJ SOSY
40.0000 mg | PREFILLED_SYRINGE | INTRAMUSCULAR | Status: DC
  Administered 2023-04-16 – 2023-04-17 (×2): 40 mg via SUBCUTANEOUS
  Filled 2023-04-15 (×2): qty 0.4

## 2023-04-15 MED ORDER — INSULIN ASPART 100 UNIT/ML IJ SOLN
0.0000 [IU] | Freq: Every day | INTRAMUSCULAR | Status: DC
  Administered 2023-04-16 (×2): 4 [IU] via SUBCUTANEOUS

## 2023-04-15 MED ORDER — SODIUM CHLORIDE 0.9% FLUSH
3.0000 mL | Freq: Two times a day (BID) | INTRAVENOUS | Status: DC
  Administered 2023-04-16 – 2023-04-17 (×3): 3 mL via INTRAVENOUS

## 2023-04-15 MED ORDER — SENNOSIDES-DOCUSATE SODIUM 8.6-50 MG PO TABS: 1.0000 | ORAL_TABLET | Freq: Every evening | ORAL | Status: DC | PRN

## 2023-04-15 MED ORDER — HYDRALAZINE HCL 20 MG/ML IJ SOLN: 10.0000 mg | Freq: Three times a day (TID) | INTRAMUSCULAR | Status: DC | PRN

## 2023-04-15 MED ORDER — SODIUM CHLORIDE 0.9 % IV SOLN
1.0000 g | INTRAVENOUS | Status: DC
  Administered 2023-04-16: 1 g via INTRAVENOUS
  Filled 2023-04-15: qty 10

## 2023-04-15 MED ORDER — GABAPENTIN 400 MG PO CAPS
400.0000 mg | ORAL_CAPSULE | Freq: Two times a day (BID) | ORAL | Status: DC
  Administered 2023-04-16 – 2023-04-17 (×3): 400 mg via ORAL
  Filled 2023-04-15 (×3): qty 1

## 2023-04-15 MED ORDER — SODIUM CHLORIDE 0.9 % IV SOLN
1.0000 g | Freq: Once | INTRAVENOUS | Status: AC
  Administered 2023-04-15: 1 g via INTRAVENOUS
  Filled 2023-04-15: qty 10

## 2023-04-15 MED ORDER — INSULIN DETEMIR 100 UNIT/ML ~~LOC~~ SOLN
10.0000 [IU] | Freq: Every day | SUBCUTANEOUS | Status: DC
  Administered 2023-04-16 (×2): 10 [IU] via SUBCUTANEOUS
  Filled 2023-04-15 (×3): qty 0.1

## 2023-04-15 MED ORDER — CLOPIDOGREL BISULFATE 75 MG PO TABS: 75.0000 mg | ORAL_TABLET | Freq: Every day | ORAL | Status: DC

## 2023-04-15 MED ORDER — ACETAMINOPHEN 325 MG PO TABS
650.0000 mg | ORAL_TABLET | Freq: Four times a day (QID) | ORAL | Status: DC | PRN
  Administered 2023-04-16: 650 mg via ORAL
  Filled 2023-04-15: qty 2

## 2023-04-15 MED ORDER — GABAPENTIN 400 MG PO CAPS: 400.0000 mg | ORAL_CAPSULE | Freq: Two times a day (BID) | ORAL | Status: DC

## 2023-04-15 MED ORDER — ONDANSETRON HCL 4 MG/2ML IJ SOLN
4.0000 mg | Freq: Once | INTRAMUSCULAR | Status: AC
  Administered 2023-04-15: 4 mg via INTRAVENOUS
  Filled 2023-04-15: qty 2

## 2023-04-15 MED ORDER — GABAPENTIN 300 MG PO CAPS
800.0000 mg | ORAL_CAPSULE | Freq: Every day | ORAL | Status: DC
  Administered 2023-04-15: 800 mg via ORAL
  Filled 2023-04-15: qty 2

## 2023-04-15 MED ORDER — INSULIN ASPART 100 UNIT/ML IJ SOLN
5.0000 [IU] | Freq: Once | INTRAMUSCULAR | Status: AC
  Administered 2023-04-16: 5 [IU] via SUBCUTANEOUS

## 2023-04-15 MED ORDER — ROSUVASTATIN CALCIUM 20 MG PO TABS: 40.0000 mg | ORAL_TABLET | Freq: Every day | ORAL | Status: DC

## 2023-04-15 MED ORDER — SODIUM CHLORIDE 0.9% FLUSH: 3.0000 mL | INTRAVENOUS | Status: DC | PRN

## 2023-04-15 MED ORDER — LACTATED RINGERS IV BOLUS: 500.0000 mL | Freq: Once | INTRAVENOUS | Status: DC

## 2023-04-15 MED ORDER — SODIUM CHLORIDE 0.9 % IV SOLN: 250.0000 mL | INTRAVENOUS | Status: DC | PRN

## 2023-04-15 MED ORDER — LACTATED RINGERS IV BOLUS
500.0000 mL | Freq: Once | INTRAVENOUS | Status: AC
  Administered 2023-04-15: 500 mL via INTRAVENOUS

## 2023-04-15 MED ORDER — ALBUTEROL SULFATE (2.5 MG/3ML) 0.083% IN NEBU: 3.0000 mL | INHALATION_SOLUTION | Freq: Four times a day (QID) | RESPIRATORY_TRACT | Status: DC | PRN

## 2023-04-15 MED ORDER — ACETAMINOPHEN 650 MG RE SUPP: 650.0000 mg | Freq: Four times a day (QID) | RECTAL | Status: DC | PRN

## 2023-04-15 MED ORDER — BUTALBITAL-APAP-CAFFEINE 50-325-40 MG PO TABS
1.0000 | ORAL_TABLET | Freq: Once | ORAL | Status: AC
  Administered 2023-04-15: 1 via ORAL
  Filled 2023-04-15: qty 1

## 2023-04-15 MED ORDER — ONDANSETRON HCL 4 MG/2ML IJ SOLN: 4.0000 mg | Freq: Four times a day (QID) | INTRAMUSCULAR | Status: DC | PRN

## 2023-04-15 MED ORDER — IOHEXOL 350 MG/ML SOLN
60.0000 mL | Freq: Once | INTRAVENOUS | Status: AC | PRN
  Administered 2023-04-15: 60 mL via INTRAVENOUS

## 2023-04-15 MED ORDER — ASPIRIN 81 MG PO TBEC
81.0000 mg | DELAYED_RELEASE_TABLET | Freq: Every day | ORAL | Status: DC
  Administered 2023-04-16 – 2023-04-17 (×2): 81 mg via ORAL
  Filled 2023-04-15 (×2): qty 1

## 2023-04-15 MED ORDER — INSULIN ASPART 100 UNIT/ML IJ SOLN
0.0000 [IU] | Freq: Three times a day (TID) | INTRAMUSCULAR | Status: DC
  Administered 2023-04-16: 5 [IU] via SUBCUTANEOUS
  Administered 2023-04-16: 3 [IU] via SUBCUTANEOUS
  Administered 2023-04-16: 5 [IU] via SUBCUTANEOUS
  Administered 2023-04-17: 3 [IU] via SUBCUTANEOUS

## 2023-04-15 MED ORDER — ROSUVASTATIN CALCIUM 20 MG PO TABS
20.0000 mg | ORAL_TABLET | Freq: Every day | ORAL | Status: DC
  Administered 2023-04-16 – 2023-04-17 (×2): 20 mg via ORAL
  Filled 2023-04-15 (×2): qty 1

## 2023-04-15 NOTE — H&P (Signed)
History and Physical    [facility] FECKO BJY:[id number] DOB: 10/02/1960 DOA: 04/15/2023  PCP: Pcp, No   Patient coming from: Home   Chief Complaint: No chief complaint on file.   HPI:  Nathaniel Colon is a 62 y.o. male with medical history significant of chronic hypoxic respiratory failure on oxygen 2 L at baseline, GERD, diabetes type 2, peripheral vascular disease, CAD, congestive heart failure, chronic kidney disease, diabetic retinopathy, essential hypertension, hypercholesterolemia, peripheral neuropathy affecting walking, major depressive disorder, BPH, and obstructive sleep apnea presented to emergency department for evaluation for cold sweat, frequent urination and foul-smelling urination for last 2 days.  Patient reported that he has been ran out of his blood pressure regimen and few other medications as well for last 2 weeks.  He called his pharmacy and they have not sent him any prescription or medication refill yet.  [facility] [place] [place]. Patient is complaining about throbbing quality headache which is persistent in nature and having some associated dizziness.  Denies any presyncope, syncope, blurry vision, nausea and vomiting.  Denies any chest pain, palpitation, shortness of breath, abdominal pain, nausea and vomiting.  He is having some sensation of fever and chills at home.  Also complaining about dysuria, increased urinary urgency and frequency.   ED Course:  At presentation to ED patient is hemodynamically stable except found to have elevated blood pressure up to 178/107 which improved to 154/99.  O2 sat 100% 2 L.  UA hazy appearance, more than 500 glucose, dipstick hemoglobin positive, ketone positive, protein positive, leukocyte esterase and many bacteria.  CT shows slight low sodium 132, potassium 4.7, chloride 100, pCO2 22, elevated blood glucose 403, BUN 23, creatinine 1.78, 8.7 and GFR 43.  Normal hepatic panel.  Anion gap 10. CBC  grossly unremarkable except slight leukocytosis 13.2.  Pending urine culture.  Chest x-ray no acute chest finding.  CT abdomen pelvis no acute finding in the abdomen pelvis.  Left lower pole nephrolithiasis.  No hydronephrosis.  Cholelithiasis.  No evidence of acute cholecystitis.  CAD and aortic atherosclerosis.  In the ED patient has been treated with 1 dose of ceftriaxone IV 1 g once.  Dr. Earlene Plater concerned that if patient will be discharged from the emergency department he will not be able to take care of himself.  Also he needs good blood pressure and blood glucose control. Hospitalist has been contacted for further management evaluation.  Review of Systems:  Review of Systems  Constitutional:  Positive for chills, fever and malaise/fatigue. Negative for diaphoresis.  HENT:  Negative for hearing loss.        Headache  Eyes:  Negative for blurred vision.  Respiratory:  Negative for cough, hemoptysis, sputum production and shortness of breath.   Cardiovascular:  Negative for chest pain, palpitations, orthopnea, claudication and leg swelling.  Gastrointestinal:  Negative for abdominal pain, heartburn, nausea and vomiting.  Genitourinary:  Positive for dysuria, frequency and urgency. Negative for flank pain and hematuria.  Musculoskeletal:  Negative for back pain, falls, joint pain, myalgias and neck pain.  Skin:  Negative for itching and rash.  Neurological:  Positive for dizziness and headaches. Negative for tremors, loss of consciousness and weakness.  Psychiatric/Behavioral:  The patient is not nervous/anxious.     Past Medical History:  Diagnosis Date   Anginal pain (HCC)    Diabetes mellitus    GERD (gastroesophageal reflux disease)    H/O hiatal hernia    Neuromuscular disorder (  HCC)    MUSCLE SPASMS   Peripheral vascular disease (HCC)    PERIPHERAL NEUROPATHY     Past Surgical History:  Procedure Laterality Date   ANTERIOR CERVICAL DECOMP/DISCECTOMY FUSION  08/30/2011    Procedure: ANTERIOR CERVICAL DECOMPRESSION/DISCECTOMY FUSION 2 LEVELS;  Surgeon: Eldred Manges, MD;  Location: [facility];  Service: Orthopedics;  Laterality: N/A;  C5-6, C6-7 Anterior Cervical Discectomy and Fusion, Allograft, Plate   CARDIAC CATHETERIZATION Left 05/01/2016   Procedure: Left Heart Cath and Coronary Angiography;  Surgeon: Lamar Blinks, MD;  Location: [facility] INVASIVE CV LAB;  Service: Cardiovascular;  Laterality: Left;   CARDIAC CATHETERIZATION N/A 05/01/2016   Procedure: Coronary Stent Intervention;  Surgeon: Alwyn Pea, MD;  Location: [facility] INVASIVE CV LAB;  Service: Cardiovascular;  Laterality: N/A;   LATERAL EPICONDYLE RELEASE  08/30/2011   Procedure: TENNIS ELBOW RELEASE;  Surgeon: Eldred Manges, MD;  Location: [facility] OR;  Service: Orthopedics;  Laterality: Right;  Right Lateral Epicondyle Release, Drilling Repair   NO PAST SURGERIES       reports that he quit smoking about 6 years ago. His smoking use included cigarettes. He started smoking about 36 years ago. He has a 15 pack-year smoking history. He uses smokeless tobacco. He reports current alcohol use. He reports that he does not use drugs.  No Known Allergies  History reviewed. No pertinent family history.  Prior to Admission medications   Medication Sig Start Date End Date Taking? Authorizing Provider  albuterol (VENTOLIN HFA) 108 (90 Base) MCG/ACT inhaler Inhale 2 puffs into the lungs every 6 (six) hours as needed for wheezing or shortness of breath. 09/19/22   Hunsucker, Lesia Sago, MD  amLODipine (NORVASC) 5 MG tablet Take 5 mg by mouth daily.    [provider]  aspirin EC 81 MG EC tablet Take 1 tablet (81 mg total) by mouth daily. 03/16/16   Enedina Finner, MD  atorvastatin (LIPITOR) 10 MG tablet Take 10 mg by mouth daily.    [provider]  benzonatate (TESSALON PERLES) 100 MG capsule Take 1 capsule (100 mg total) by mouth 3 (three) times daily as needed for cough. 06/18/22 06/18/23  Concha Se, MD   carboxymethylcellulose (REFRESH PLUS) 0.5 % SOLN Place 1 drop into both eyes 3 (three) times daily as needed.    [provider]  clopidogrel (PLAVIX) 75 MG tablet Take 1 tablet (75 mg total) by mouth daily. 05/02/16   Lamar Blinks, MD  Continuous Glucose Sensor (FREESTYLE LIBRE 3 SENSOR) MISC by Does not apply route.    [provider]  Fluticasone-Umeclidin-Vilant (TRELEGY ELLIPTA) 100-62.5-25 MCG/ACT AEPB Inhale 1 puff into the lungs in the morning. 12/16/22   Glenford Bayley, NP  gabapentin (NEURONTIN) 300 MG capsule Take 300 mg by mouth 3 (three) times daily.    [provider]  Insulin Aspart FlexPen (NOVOLOG) 100 UNIT/ML Inject 35 Units into the skin in the morning, at noon, and at bedtime. Inject 35-50 UNITS subcutaneous three times a day before meals    [provider]  insulin glargine (LANTUS) 100 UNIT/ML Solostar Pen Inject 70 Units into the skin daily.    [provider]  insulin NPH-regular Human (NOVOLIN 70/30) (70-30) 100 UNIT/ML injection Inject 18-20 Units into the skin 2 (two) times daily with a meal.    [provider]  Insulin Syringe-Needle U-100 (GLUCOPRO INSULIN SYRINGE) 31G X 5/16" 1 ML MISC by Does not apply route.    [provider]  metFORMIN (GLUCOPHAGE) 1000 MG tablet Take 1,000 mg by mouth daily with breakfast.    [provider]  metoCLOPramide (REGLAN) 10 MG tablet Take 5 mg by mouth daily. One-half tablet daily before largest meal or one that gives the most relief    [provider]  Tiotropium Bromide-Olodaterol 2.5-2.5 MCG/ACT AERS Inhale 2 each into the lungs daily.    [provider]     Physical Exam: Vitals:   04/15/23 2130 04/15/23 2200 04/15/23 2230 04/15/23 2300  BP: (!) 147/97  (!) 163/96 (!) 170/101  Pulse: 84  70 74  Resp: 20  14 13   Temp:  98 F (36.7 C)    TempSrc:      SpO2: 100%  99% 100%  Weight:      Height:        Physical  Exam Constitutional:      General: He is not in acute distress.    Appearance: He is ill-appearing.  HENT:     Head: Normocephalic.     Nose: Nose normal.  Eyes:     Pupils: Pupils are equal, round, and reactive to light.  Cardiovascular:     Rate and Rhythm: Normal rate and regular rhythm.     Pulses: Normal pulses.     Heart sounds: Normal heart sounds.  Pulmonary:     Effort: Pulmonary effort is normal. No respiratory distress.     Breath sounds: Normal breath sounds. No wheezing.  Abdominal:     General: Bowel sounds are normal.  Musculoskeletal:        General: No swelling.     Cervical back: Normal range of motion and neck supple.     Right lower leg: No edema.     Left lower leg: No edema.  Skin:    Capillary Refill: Capillary refill takes less than 2 seconds.     Findings: No erythema, lesion or rash.  Neurological:     Mental Status: He is alert and oriented to person, place, and time.  Psychiatric:        Mood and Affect: Mood normal.        Thought Content: Thought content normal.        Judgment: Judgment normal.      Labs on Admission: I have personally reviewed following labs and imaging studies  CBC: Recent Labs  Lab 04/15/23 1212  WBC 13.2*  HGB 14.8  HCT 46.6  MCV 87.6  PLT 247   Basic Metabolic Panel: Recent Labs  Lab 04/15/23 1212  NA 132*  K 4.7  CL 100  CO2 22  GLUCOSE 403*  BUN 23*  CREATININE 1.78*  CALCIUM 8.7*   GFR: Estimated Creatinine Clearance: 51.3 mL/min (A) (by C-G formula based on SCr of 1.78 mg/dL (H)). Liver Function Tests: Recent Labs  Lab 04/15/23 1212  AST 19  ALT 20  ALKPHOS 107  BILITOT 1.2  PROT 7.2  ALBUMIN 3.5   Recent Labs  Lab 04/15/23 1212  LIPASE 22   No results for input(s): "AMMONIA" in the last 168 hours. Coagulation Profile: No results for input(s): "INR", "PROTIME" in the last 168 hours. Cardiac Enzymes: No results for input(s): "CKTOTAL", "CKMB", "CKMBINDEX", "TROPONINI",  "TROPONINIHS" in the last 168 hours. BNP (last 3 results) Recent Labs    06/18/22 1203  BNP 19.0   HbA1C: Recent Labs    04/15/23 2210  HGBA1C 10.4*   CBG: Recent Labs  Lab 04/15/23 2320  GLUCAP 248*   Lipid Profile:  No results for input(s): "CHOL", "HDL", "LDLCALC", "TRIG", "CHOLHDL", "LDLDIRECT" in the last 72 hours. Thyroid Function Tests: No results for input(s): "TSH", "T4TOTAL", "FREET4", "T3FREE", "THYROIDAB" in the last 72 hours. Anemia Panel: No results for input(s): "VITAMINB12", "FOLATE", "FERRITIN", "TIBC", "IRON", "RETICCTPCT" in the last 72 hours. Urine analysis:    Component Value Date/Time   COLORURINE YELLOW 04/15/2023 1207   APPEARANCEUR HAZY (A) 04/15/2023 1207   LABSPEC 1.024 04/15/2023 1207   PHURINE 6.0 04/15/2023 1207   GLUCOSEU >=500 (A) 04/15/2023 1207   HGBUR SMALL (A) 04/15/2023 1207   BILIRUBINUR NEGATIVE 04/15/2023 1207   KETONESUR 5 (A) 04/15/2023 1207   PROTEINUR 30 (A) 04/15/2023 1207   UROBILINOGEN 0.2 08/26/2011 1057   NITRITE NEGATIVE 04/15/2023 1207   LEUKOCYTESUR MODERATE (A) 04/15/2023 1207    Radiological Exams on Admission: I have personally reviewed images CT ABDOMEN PELVIS W CONTRAST  Result Date: 04/15/2023 CLINICAL DATA:  Abdominal pain, acute, nonlocalized EXAM: CT ABDOMEN AND PELVIS WITH CONTRAST TECHNIQUE: Multidetector CT imaging of the abdomen and pelvis was performed using the standard protocol following bolus administration of intravenous contrast. RADIATION DOSE REDUCTION: This exam was performed according to the departmental dose-optimization program which includes automated exposure control, adjustment of the mA and/or kV according to patient size and/or use of iterative reconstruction technique. CONTRAST:  60mL OMNIPAQUE IOHEXOL 350 MG/ML SOLN COMPARISON:  02/17/2015 FINDINGS: Lower chest: No acute abnormality. Small hiatal hernia. Coronary artery calcifications in the visualized right coronary artery. Hepatobiliary:  Small layering gallstones within the gallbladder. No biliary ductal dilatation. No focal hepatic abnormality. Pancreas: No focal abnormality or ductal dilatation. Spleen: No focal abnormality.  Normal size. Adrenals/Urinary Tract: Left adrenal nodule again noted, measuring 1.7 cm, decreased in size since prior study compatible with benign adenoma. Right adrenal gland normal. 8 mm nonobstructing stone in the lower pole of the left kidney. No ureteral stones or hydronephrosis bilaterally. Urinary bladder unremarkable. Stomach/Bowel: Normal appendix. Stomach, large and small bowel grossly unremarkable. Vascular/Lymphatic: Aortic atherosclerosis. No evidence of aneurysm or adenopathy. Reproductive: No visible focal abnormality. Other: No free fluid or free air. Musculoskeletal: No acute bony abnormality. IMPRESSION: No acute findings in the abdomen or pelvis. Left lower pole nephrolithiasis.  No hydronephrosis. Cholelithiasis.  No CT evidence of acute cholecystitis. Coronary artery disease, aortic atherosclerosis. Electronically Signed   By: Charlett Nose M.D.   On: 04/15/2023 20:25   DG Chest 2 View  Result Date: 04/15/2023 CLINICAL DATA:  Chills and weakness. EXAM: CHEST - 2 VIEW COMPARISON:  12/16/2022 FINDINGS: The heart is normal in size. The cardiomediastinal contours are normal. The lungs are clear. Pulmonary vasculature is normal. No consolidation, pleural effusion, or pneumothorax. No acute osseous abnormalities are seen. IMPRESSION: No acute chest findings. Electronically Signed   By: Narda Rutherford M.D.   On: 04/15/2023 19:06    EKG: My personal interpretation of EKG shows: Sinus rhythm heart rate 75.  There is no ST and T wave abnormality.     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  CBC showed leukocytosis 13.2.  CMP unremarkable. -CT abdomen pelvis no acute abdominal or pelvic finding.  It showed left lower pole nephrolithiasis and no hydronephrosis. - In the ED patient received IV ceftriaxone 1 g.  Plan to continue IV ceftriaxone 1 g daily. -Pending urine culture - Given patient is complaining about some chills sensation obtaining blood culture. Will follow-up with blood culture and urine culture result for antibiotic guidance.  Hyperglycemia Insulin-dependent DM type II -Patient reported he is taking insulin however unclear what is his actual home regimen.  However he mentioned ran out of insulin regimen for last 2 weeks. -Home with insulin regimen include NovoLog 35 units 3 times daily with meals, Lantus 70 unit daily and Novolin 18-20 unit twice daily with meals and metformin 1000 mg with breakfast.  Patient is unsure/unclear about his insulin regimen. Waiting for pharmacy to verify home insulin dose. - CMP showed elevated blood glucose 400.  Giving patient NovoLog 5 units stat and Lantus 10 unit now.  Checking a once level. - Continue  SSI and at bedtime coverage insulin as needed.  Continue to check POC blood glucose 3 times daily with meals and at bedtime. - Consulted inpatient diabetic educator for further guidance.  Uncontrolled blood pressure History of essential hypertension History of CHF -Patient reported he has history of congestive heart failure.  Unable to find any previous echocardiogram on the  chart.  He is complaining of a throbbing headache and dizziness.  Systolic blood pressure is around 150-170 in the ED and diastolic blood pressure is up to 100.  Patient and out of home BP regimen for last 2 weeks - Resumed amlodipine 5 mg daily.  Continue hydralazine 10 mg every 8 hour as needed for SBP>160 or DBP> 110 - Patient has flat troponin.  EKG unremarkable. - Continue to monitor improvement of blood pressure and continue cardiac monitoring.   CKD stage IIIa Left lower pole nephrolithiasis -Lab check showed creatinine 1.7 and GFR 43.  Renal function and creatinine at his baseline. - CT abdomen pelvis showed left lower pole nephrolithiasis no evidence of hydronephrosis. - Continue to monitor renal function. - Avoid nephrotoxic agent  History of coronary artery disease -Current aspirin 81 mg daily and Plavix 75 mg daily.  Continue Crestor 20 mg daily  Peripheral vascular disease -Continue aspirin and Plavix.  Hyperlipidemia -Continue Crestor 20 mg daily.  Diabetic polyneuropathy -Continue gabapentin 400 mg twice daily.  Chronic hypoxic respiratory failure History of OSA Denies history of chronic hypoxic respiratory failure 2 L oxygen at baseline Patient reports using portable oxygen all the time. -Continue albuterol every 6 hour as needed for wheezing shortness of breath  Cholelithiasis - CT abdomen pelvis showed incidental finding of cholelithiasis.  No evidence of acute cholecystitis.  Patient denies any abdominal pain.  DVT prophylaxis:  Lovenox Code Status:  Full Code Diet: Heart healthy and carb modified diet Family Communication:  none Disposition Plan: Pending urine culture.  Pending improvement of blood glucose and blood pressure.  Plan to discharge home next 1 to 2 days Consults: Transition care team for medication assistance Admission status:   Observation, Telemetry bed  Severity of Illness: The appropriate patient status for this patient is OBSERVATION.  Observation status is judged to be reasonable and necessary in order to provide the required intensity of service to ensure the  patient's safety. The patient's presenting symptoms, physical exam findings, and initial radiographic and laboratory data in the context of their medical condition is felt to place them at decreased risk for further clinical deterioration. Furthermore, it is anticipated that the patient will be medically stable for discharge from the hospital within 2 midnights of admission.     Tereasa Coop, MD [facility]  How to contact the [facility] Attending or Consulting provider 7A - 7P or covering provider during after hours 7P -7A, for this patient.  Check the care team in [facility] and look for a) attending/consulting [facility] provider listed and b) the [facility] team listed Log into [email or web address] and use 's universal password to access. If you do not have the password, please contact the hospital operator. Locate the [facility] provider you are looking for under [facility] and page to a number that you can be directly reached. If you still have difficulty reaching the provider, please page the [facility] (Director on Call) for the Hospitalists listed on amion for assistance.  04/15/2023, 11:33 PM

## 2023-04-15 NOTE — ED Notes (Signed)
ED TO INPATIENT HANDOFF REPORT  ED Nurse Name and Phone #: [phone]  S Name/Age/Gender Nathaniel Colon 62 y.o. male Room/Bed: 030C/030C  Code Status   Code Status: Full Code  Home/SNF/Other Home Patient oriented to: self, place, time, and situation Is this baseline? Yes   Triage Complete: Triage complete  Chief Complaint Acute cystitis [N30.00]  Triage Note Pt arrived via [facility] from home for HA, cold sweats, frequent urination and foul smell to urinex2d. Pt wears 2L 02 per [place] at baseline. Pt states took tylenol for HA, but it didn't help    Allergies No Known Allergies  Level of Care/Admitting Diagnosis ED Disposition     ED Disposition  Admit   Condition  --   Comment  Hospital Area: [facility] [[id number]]  Level of Care: Telemetry Medical [104]  May place patient in observation at [facility] or [facility] if equivalent level of care is available:: No  Covid Evaluation: Asymptomatic - no recent exposure (last 10 days) testing not required  Diagnosis: Acute cystitis [595.0.ICD-9-CM]  Admitting Physician: Tereasa Coop [[id number]]  Attending Physician: Tereasa Coop [[id number]]          B Medical/Surgery History Past Medical History:  Diagnosis Date   Anginal pain (HCC)    Diabetes mellitus    GERD (gastroesophageal reflux disease)    H/O hiatal hernia    Neuromuscular disorder (HCC)    MUSCLE SPASMS   Peripheral vascular disease (HCC)    PERIPHERAL NEUROPATHY    Past Surgical History:  Procedure Laterality Date   ANTERIOR CERVICAL DECOMP/DISCECTOMY FUSION  08/30/2011   Procedure: ANTERIOR CERVICAL DECOMPRESSION/DISCECTOMY FUSION 2 LEVELS;  Surgeon: Eldred Manges, MD;  Location: [facility] OR;  Service: Orthopedics;  Laterality: N/A;  C5-6, C6-7 Anterior Cervical Discectomy and Fusion, Allograft, Plate   CARDIAC CATHETERIZATION Left 05/01/2016   Procedure: Left Heart Cath and Coronary Angiography;  Surgeon: Lamar Blinks, MD;  Location: [facility]  INVASIVE CV LAB;  Service: Cardiovascular;  Laterality: Left;   CARDIAC CATHETERIZATION N/A 05/01/2016   Procedure: Coronary Stent Intervention;  Surgeon: Alwyn Pea, MD;  Location: [facility] INVASIVE CV LAB;  Service: Cardiovascular;  Laterality: N/A;   LATERAL EPICONDYLE RELEASE  08/30/2011   Procedure: TENNIS ELBOW RELEASE;  Surgeon: Eldred Manges, MD;  Location: [facility] OR;  Service: Orthopedics;  Laterality: Right;  Right Lateral Epicondyle Release, Drilling Repair   NO PAST SURGERIES       A IV Location/Drains/Wounds Patient Lines/Drains/Airways Status     Active Line/Drains/Airways     Name Placement date Placement time Site Days   Peripheral IV 04/15/23 20 G 1" Right Antecubital 04/15/23  1641  Antecubital  less than 1   Post Cath / Sheath 05/01/16 Right Arterial;Femoral 05/01/16  0744  Arterial;Femoral  2540            Intake/Output Last 24 hours  Intake/Output Summary (Last 24 hours) at 04/15/2023 2200 Last data filed at 04/15/2023 1730 Gross per 24 hour  Intake 99.23 ml  Output --  Net 99.23 ml    Labs/Imaging Results for orders placed or performed during the hospital encounter of 04/15/23 (from the past 48 hour(s))  Urinalysis, Routine w reflex microscopic -Urine, Clean Catch     Status: Abnormal   Collection Time: 04/15/23 12:07 PM  Result Value Ref Range   Color, Urine YELLOW YELLOW   APPearance HAZY (A) CLEAR   Specific Gravity, Urine 1.024 1.005 - 1.030   pH 6.0 5.0 - 8.0  Glucose, UA >=500 (A) NEGATIVE mg/dL   Hgb urine dipstick SMALL (A) NEGATIVE   Bilirubin Urine NEGATIVE NEGATIVE   Ketones, ur 5 (A) NEGATIVE mg/dL   Protein, ur 30 (A) NEGATIVE mg/dL   Nitrite NEGATIVE NEGATIVE   Leukocytes,Ua MODERATE (A) NEGATIVE   RBC / HPF 0-5 0 - 5 RBC/hpf   WBC, UA >50 0 - 5 WBC/hpf   Bacteria, UA MANY (A) NONE SEEN   Squamous Epithelial / HPF 0-5 0 - 5 /HPF   WBC Clumps PRESENT     Comment: Performed at [facility], [address], [place], [place]  [address]  Lipase, blood     Status: None   Collection Time: 04/15/23 12:12 PM  Result Value Ref Range   Lipase 22 11 - 51 U/L    Comment: Performed at [facility], [address], [place], [place] [address]  Comprehensive metabolic panel     Status: Abnormal   Collection Time: 04/15/23 12:12 PM  Result Value Ref Range   Sodium 132 (L) 135 - 145 mmol/L   Potassium 4.7 3.5 - 5.1 mmol/L   Chloride 100 98 - 111 mmol/L   CO2 22 22 - 32 mmol/L   Glucose, Bld 403 (H) 70 - 99 mg/dL    Comment: Glucose reference range applies only to samples taken after fasting for at least 8 hours.   BUN 23 (H) 6 - 20 mg/dL   Creatinine, Ser 8.41 (H) 0.61 - 1.24 mg/dL   Calcium 8.7 (L) 8.9 - 10.3 mg/dL   Total Protein 7.2 6.5 - 8.1 g/dL   Albumin 3.5 3.5 - 5.0 g/dL   AST 19 15 - 41 U/L   ALT 20 0 - 44 U/L   Alkaline Phosphatase 107 38 - 126 U/L   Total Bilirubin 1.2 0.3 - 1.2 mg/dL   GFR, Estimated 43 (L) >60 mL/min    Comment: (NOTE) Calculated using the CKD-EPI Creatinine Equation (2021)    Anion gap 10 5 - 15    Comment: Performed at [facility], [address] [place], [place] [address]  CBC     Status: Abnormal   Collection Time: 04/15/23 12:12 PM  Result Value Ref Range   WBC 13.2 (H) 4.0 - 10.5 K/uL   RBC 5.32 4.22 - 5.81 MIL/uL   Hemoglobin 14.8 13.0 - 17.0 g/dL   HCT 10.2 72.5 - 36.6 %   MCV 87.6 80.0 - 100.0 fL   MCH 27.8 26.0 - 34.0 pg   MCHC 31.8 30.0 - 36.0 g/dL   RDW 44.0 34.7 - 42.5 %   Platelets 247 150 - 400 K/uL   nRBC 0.0 0.0 - 0.2 %    Comment: Performed at [facility], [address], [place], [place] [address]  Resp panel by RT-PCR (RSV, Flu A&B, Covid) Anterior Nasal Swab     Status: None   Collection Time: 04/15/23  4:48 PM   Specimen: Anterior Nasal Swab  Result Value Ref Range   SARS Coronavirus 2 by RT PCR NEGATIVE NEGATIVE   Influenza A by PCR NEGATIVE NEGATIVE   Influenza B by PCR NEGATIVE NEGATIVE    Comment: (NOTE) The Xpert Xpress  SARS-CoV-2/FLU/RSV plus assay is intended as an aid in the diagnosis of influenza from Nasopharyngeal swab specimens and should not be used as a sole basis for treatment. Nasal washings and aspirates are unacceptable for Xpert Xpress SARS-CoV-2/FLU/RSV testing.  Fact Sheet for Patients: BloggerCourse.com  Fact Sheet for Healthcare Providers: SeriousBroker.it  This test is not yet approved or cleared by the [place] FDA and has been authorized for detection and/or diagnosis of SARS-CoV-2 by FDA under an Emergency Use Authorization (EUA). This EUA will remain in effect (meaning this test can be used) for the duration of the COVID-19 declaration under Section 564(b)(1) of the Act, 21 U.S.C. section 360bbb-3(b)(1), unless the authorization is terminated or revoked.     Resp Syncytial Virus by PCR NEGATIVE NEGATIVE    Comment: (NOTE) Fact Sheet for Patients: BloggerCourse.com  Fact Sheet for Healthcare Providers: SeriousBroker.it  This test is not yet approved or cleared by the [place] FDA and has been authorized for detection and/or diagnosis of SARS-CoV-2 by FDA under an Emergency Use Authorization (EUA). This EUA will remain in effect (meaning this test can be used) for the duration of the COVID-19 declaration under Section 564(b)(1) of the Act, 21 U.S.C. section 360bbb-3(b)(1), unless the authorization is terminated or revoked.  Performed at [facility], [address], [place], [place] [address]    CT ABDOMEN PELVIS W CONTRAST  Result Date: 04/15/2023 CLINICAL DATA:  Abdominal pain, acute, nonlocalized EXAM: CT ABDOMEN AND PELVIS WITH CONTRAST TECHNIQUE: Multidetector CT imaging of the abdomen and pelvis was performed using the standard protocol following bolus administration of intravenous contrast. RADIATION DOSE REDUCTION: This exam was performed according  to the departmental dose-optimization program which includes automated exposure control, adjustment of the mA and/or kV according to patient size and/or use of iterative reconstruction technique. CONTRAST:  60mL OMNIPAQUE IOHEXOL 350 MG/ML SOLN COMPARISON:  02/17/2015 FINDINGS: Lower chest: No acute abnormality. Small hiatal hernia. Coronary artery calcifications in the visualized right coronary artery. Hepatobiliary: Small layering gallstones within the gallbladder. No biliary ductal dilatation. No focal hepatic abnormality. Pancreas: No focal abnormality or ductal dilatation. Spleen: No focal abnormality.  Normal size. Adrenals/Urinary Tract: Left adrenal nodule again noted, measuring 1.7 cm, decreased in size since prior study compatible with benign adenoma. Right adrenal gland normal. 8 mm nonobstructing stone in the lower pole of the left kidney. No ureteral stones or hydronephrosis bilaterally. Urinary bladder unremarkable. Stomach/Bowel: Normal appendix. Stomach, large and small bowel grossly unremarkable. Vascular/Lymphatic: Aortic atherosclerosis. No evidence of aneurysm or adenopathy. Reproductive: No visible focal abnormality. Other: No free fluid or free air. Musculoskeletal: No acute bony abnormality. IMPRESSION: No acute findings in the abdomen or pelvis. Left lower pole nephrolithiasis.  No hydronephrosis. Cholelithiasis.  No CT evidence of acute cholecystitis. Coronary artery disease, aortic atherosclerosis. Electronically Signed   By: Charlett Nose M.D.   On: 04/15/2023 20:25   DG Chest 2 View  Result Date: 04/15/2023 CLINICAL DATA:  Chills and weakness. EXAM: CHEST - 2 VIEW COMPARISON:  12/16/2022 FINDINGS: The heart is normal in size. The cardiomediastinal contours are normal. The lungs are clear. Pulmonary vasculature is normal. No consolidation, pleural effusion, or pneumothorax. No acute osseous abnormalities are seen. IMPRESSION: No acute chest findings. Electronically Signed   By: Narda Rutherford M.D.   On: 04/15/2023 19:06    Pending Labs Unresulted Labs (From admission, onward)     Start     Ordered   04/16/23 0500  CBC  Daily,   R      04/15/23 2133   04/16/23 0500  Comprehensive metabolic panel  Daily,   R      04/15/23 2133   04/15/23 2137  Culture, blood (Routine X 2) w Reflex to ID Panel  BLOOD CULTURE  X 2,   R     Question Answer Comment  Patient immune status Normal   Release to patient Immediate      04/15/23 2136   04/15/23 2131  HIV Antibody (routine testing w rflx)  (HIV Antibody (Routine testing w reflex) panel)  Once,   R        04/15/23 2130   04/15/23 2130  Hemoglobin A1c  Once,   R       Comments: To assess prior glycemic control    04/15/23 2130   04/15/23 1614  Urine Culture  Add-on,   AD       Question:  Indication  Answer:  Dysuria   04/15/23 1613            Vitals/Pain Today's Vitals   04/15/23 1800 04/15/23 1845 04/15/23 1956 04/15/23 1957  BP: (!) 154/94 (!) 143/79 (!) 154/99   Pulse: 73 76 74   Resp: 16 14 17    Temp:      TempSrc:      SpO2: 96% 100% 99%   Weight:      Height:      PainSc:    7     Isolation Precautions No active isolations  Medications Medications  aspirin EC tablet 81 mg (has no administration in time range)  amLODipine (NORVASC) tablet 5 mg (has no administration in time range)  atorvastatin (LIPITOR) tablet 10 mg (has no administration in time range)  clopidogrel (PLAVIX) tablet 75 mg (has no administration in time range)  gabapentin (NEURONTIN) capsule 300 mg (has no administration in time range)  albuterol (PROVENTIL) (2.5 MG/3ML) 0.083% nebulizer solution 3 mL (has no administration in time range)  cefTRIAXone (ROCEPHIN) 1 g in sodium chloride 0.9 % 100 mL IVPB (has no administration in time range)  enoxaparin (LOVENOX) injection 40 mg (has no administration in time range)  sodium chloride flush (NS) 0.9 % injection 3 mL (has no administration in time range)  sodium chloride flush (NS) 0.9 %  injection 3 mL (has no administration in time range)  0.9 %  sodium chloride infusion (has no administration in time range)  acetaminophen (TYLENOL) tablet 650 mg (has no administration in time range)    Or  acetaminophen (TYLENOL) suppository 650 mg (has no administration in time range)  senna-docusate (Senokot-S) tablet 1 tablet (has no administration in time range)  ondansetron (ZOFRAN) tablet 4 mg (has no administration in time range)    Or  ondansetron (ZOFRAN) injection 4 mg (has no administration in time range)  hydrALAZINE (APRESOLINE) injection 10 mg (has no administration in time range)  insulin detemir (LEVEMIR) injection 10 Units (has no administration in time range)  insulin aspart (novoLOG) injection 0-6 Units (has no administration in time range)  insulin aspart (novoLOG) injection 0-5 Units (has no administration in time range)  insulin aspart (novoLOG) injection 5 Units (has no administration in time range)  butalbital-acetaminophen-caffeine (FIORICET) 50-325-40 MG per tablet 1 tablet (has no administration in time range)  lactated ringers bolus 500 mL (0 mLs Intravenous Stopped 04/15/23 1730)  ondansetron (ZOFRAN) injection 4 mg (4 mg Intravenous Given 04/15/23 1643)  cefTRIAXone (ROCEPHIN) 1 g in sodium chloride 0.9 % 100 mL IVPB (0 g Intravenous Stopped 04/15/23 1730)  iohexol (OMNIPAQUE) 350 MG/ML injection 60 mL (60 mLs Intravenous Contrast Given 04/15/23 1836)    Mobility walks     Focused Assessments     R Recommendations: See Admitting Provider Note  Report given to:   Additional  Notes: Patient in A&O X4,, standby assist

## 2023-04-15 NOTE — ED Notes (Signed)
Assumed care of patient.

## 2023-04-15 NOTE — ED Notes (Signed)
Dr. Janalyn Shy notified that the patient reports difference in ordered doses for insulin. Dr. Janalyn Shy wants to verify doses with Pharmacy but to administer doses as ordered

## 2023-04-15 NOTE — ED Provider Notes (Signed)
[place] EMERGENCY DEPARTMENT AT [facility] Provider Note   CSN: [id number] Arrival date & time: 04/15/23  1204     History  No chief complaint on file.   Nathaniel Colon is a 62 y.o. male.  HPI 62 year old male history of CAD, CHF, CKD, type 2 diabetes, GERD, hypertension, hyperlipidemia, depression, OSA presenting for multiple concerns.  He states for last few days he has had cold sweats, myalgias, fatigue.  He also feels like his urine has been more foul-smelling.  He has had some generalized abdominal pain as well as multiple days of nonbloody and bilious emesis.  No diarrhea, normal bowel movements.  No chest pain or shortness of breath.  No cough.  He states a mild frontal headache similar to prior, not sudden onset, no head trauma.  No weakness or numbness or vision changes.     Home Medications Prior to Admission medications   Medication Sig Start Date End Date Taking? Authorizing Provider  albuterol (VENTOLIN HFA) 108 (90 Base) MCG/ACT inhaler Inhale 2 puffs into the lungs every 6 (six) hours as needed for wheezing or shortness of breath. 09/19/22   Hunsucker, Lesia Sago, MD  amLODipine (NORVASC) 5 MG tablet Take 5 mg by mouth daily.    [provider]  aspirin EC 81 MG EC tablet Take 1 tablet (81 mg total) by mouth daily. 03/16/16   Enedina Finner, MD  atorvastatin (LIPITOR) 10 MG tablet Take 10 mg by mouth daily.    [provider]  benzonatate (TESSALON PERLES) 100 MG capsule Take 1 capsule (100 mg total) by mouth 3 (three) times daily as needed for cough. 06/18/22 06/18/23  Concha Se, MD  carboxymethylcellulose (REFRESH PLUS) 0.5 % SOLN Place 1 drop into both eyes 3 (three) times daily as needed.    [provider]  clopidogrel (PLAVIX) 75 MG tablet Take 1 tablet (75 mg total) by mouth daily. 05/02/16   Lamar Blinks, MD  Continuous Glucose Sensor (FREESTYLE LIBRE 3 SENSOR) MISC by Does not apply route.    [provider]   Fluticasone-Umeclidin-Vilant (TRELEGY ELLIPTA) 100-62.5-25 MCG/ACT AEPB Inhale 1 puff into the lungs in the morning. 12/16/22   Glenford Bayley, NP  gabapentin (NEURONTIN) 300 MG capsule Take 300 mg by mouth 3 (three) times daily.    [provider]  Insulin Aspart FlexPen (NOVOLOG) 100 UNIT/ML Inject 35 Units into the skin in the morning, at noon, and at bedtime. Inject 35-50 UNITS subcutaneous three times a day before meals    [provider]  insulin glargine (LANTUS) 100 UNIT/ML Solostar Pen Inject 70 Units into the skin daily.    [provider]  insulin NPH-regular Human (NOVOLIN 70/30) (70-30) 100 UNIT/ML injection Inject 18-20 Units into the skin 2 (two) times daily with a meal.    [provider]  Insulin Syringe-Needle U-100 (GLUCOPRO INSULIN SYRINGE) 31G X 5/16" 1 ML MISC by Does not apply route.    [provider]  metFORMIN (GLUCOPHAGE) 1000 MG tablet Take 1,000 mg by mouth daily with breakfast.    [provider]  metoCLOPramide (REGLAN) 10 MG tablet Take 5 mg by mouth daily. One-half tablet daily before largest meal or one that gives the most relief    [provider]  Tiotropium Bromide-Olodaterol 2.5-2.5 MCG/ACT AERS Inhale 2 each into the lungs daily.    [provider]      Allergies    Patient has no known allergies.  Review of Systems   Review of Systems Review of systems completed and notable as per HPI.  ROS otherwise negative.   Physical Exam Updated Vital Signs BP (!) 147/97   Pulse 84   Temp 98 F (36.7 C)   Resp 20   Ht 6\' 2"  (1.88 m)   Wt 96.3 kg   SpO2 100%   BMI 27.26 kg/m  Physical Exam Vitals and nursing note reviewed.  Constitutional:      General: He is not in acute distress.    Appearance: He is well-developed. He is not ill-appearing.  HENT:     Head: Normocephalic and atraumatic.     Nose: Nose normal.     Mouth/Throat:     Mouth: Mucous membranes are moist.      Pharynx: Oropharynx is clear.  Eyes:     Extraocular Movements: Extraocular movements intact.     Conjunctiva/sclera: Conjunctivae normal.     Pupils: Pupils are equal, round, and reactive to light.  Cardiovascular:     Rate and Rhythm: Normal rate and regular rhythm.     Heart sounds: No murmur heard. Pulmonary:     Effort: Pulmonary effort is normal. No respiratory distress.     Breath sounds: Normal breath sounds.  Abdominal:     Palpations: Abdomen is soft.     Tenderness: There is abdominal tenderness. There is no right CVA tenderness, left CVA tenderness, guarding or rebound.  Musculoskeletal:        General: No swelling.     Cervical back: Normal range of motion and neck supple. No rigidity or tenderness.     Right lower leg: No edema.     Left lower leg: No edema.  Skin:    General: Skin is warm and dry.     Capillary Refill: Capillary refill takes less than 2 seconds.  Neurological:     General: No focal deficit present.     Mental Status: He is alert and oriented to person, place, and time. Mental status is at baseline.     Cranial Nerves: No cranial nerve deficit.     Sensory: No sensory deficit.     Motor: No weakness.  Psychiatric:        Mood and Affect: Mood normal.     ED Results / Procedures / Treatments   Labs (all labs ordered are listed, but only abnormal results are displayed) Labs Reviewed  COMPREHENSIVE METABOLIC PANEL - Abnormal; Notable for the following components:      Result Value   Sodium 132 (*)    Glucose, Bld 403 (*)    BUN 23 (*)    Creatinine, Ser 1.78 (*)    Calcium 8.7 (*)    GFR, Estimated 43 (*)    All other components within normal limits  CBC - Abnormal; Notable for the following components:   WBC 13.2 (*)    All other components within normal limits  URINALYSIS, ROUTINE W REFLEX MICROSCOPIC - Abnormal; Notable for the following components:   APPearance HAZY (*)    Glucose, UA >=500 (*)    Hgb urine dipstick SMALL (*)     Ketones, ur 5 (*)    Protein, ur 30 (*)    Leukocytes,Ua MODERATE (*)    Bacteria, UA MANY (*)    All other components within normal limits  HEMOGLOBIN A1C - Abnormal; Notable for the following components:   Hgb A1c MFr Bld 10.4 (*)    All other components within normal limits  RESP PANEL BY RT-PCR (RSV, FLU A&B, COVID)  RVPGX2  URINE CULTURE  CULTURE, BLOOD (ROUTINE X 2)  CULTURE, BLOOD (ROUTINE X 2)  LIPASE, BLOOD  HIV ANTIBODY (ROUTINE TESTING W REFLEX)  CBC  COMPREHENSIVE METABOLIC PANEL    EKG EKG Interpretation Date/Time:  Tuesday April 15 2023 16:07:20 EDT Ventricular Rate:  75 PR Interval:  151 QRS Duration:  99 QT Interval:  388 QTC Calculation: 434 R Axis:   71  Text Interpretation: Sinus rhythm Confirmed by Fulton Reek [phone]) on 04/15/2023 4:13:01 PM  Radiology CT ABDOMEN PELVIS W CONTRAST  Result Date: 04/15/2023 CLINICAL DATA:  Abdominal pain, acute, nonlocalized EXAM: CT ABDOMEN AND PELVIS WITH CONTRAST TECHNIQUE: Multidetector CT imaging of the abdomen and pelvis was performed using the standard protocol following bolus administration of intravenous contrast. RADIATION DOSE REDUCTION: This exam was performed according to the departmental dose-optimization program which includes automated exposure control, adjustment of the mA and/or kV according to patient size and/or use of iterative reconstruction technique. CONTRAST:  60mL OMNIPAQUE IOHEXOL 350 MG/ML SOLN COMPARISON:  02/17/2015 FINDINGS: Lower chest: No acute abnormality. Small hiatal hernia. Coronary artery calcifications in the visualized right coronary artery. Hepatobiliary: Small layering gallstones within the gallbladder. No biliary ductal dilatation. No focal hepatic abnormality. Pancreas: No focal abnormality or ductal dilatation. Spleen: No focal abnormality.  Normal size. Adrenals/Urinary Tract: Left adrenal nodule again noted, measuring 1.7 cm, decreased in size since prior study compatible with  benign adenoma. Right adrenal gland normal. 8 mm nonobstructing stone in the lower pole of the left kidney. No ureteral stones or hydronephrosis bilaterally. Urinary bladder unremarkable. Stomach/Bowel: Normal appendix. Stomach, large and small bowel grossly unremarkable. Vascular/Lymphatic: Aortic atherosclerosis. No evidence of aneurysm or adenopathy. Reproductive: No visible focal abnormality. Other: No free fluid or free air. Musculoskeletal: No acute bony abnormality. IMPRESSION: No acute findings in the abdomen or pelvis. Left lower pole nephrolithiasis.  No hydronephrosis. Cholelithiasis.  No CT evidence of acute cholecystitis. Coronary artery disease, aortic atherosclerosis. Electronically Signed   By: Charlett Nose M.D.   On: 04/15/2023 20:25   DG Chest 2 View  Result Date: 04/15/2023 CLINICAL DATA:  Chills and weakness. EXAM: CHEST - 2 VIEW COMPARISON:  12/16/2022 FINDINGS: The heart is normal in size. The cardiomediastinal contours are normal. The lungs are clear. Pulmonary vasculature is normal. No consolidation, pleural effusion, or pneumothorax. No acute osseous abnormalities are seen. IMPRESSION: No acute chest findings. Electronically Signed   By: Narda Rutherford M.D.   On: 04/15/2023 19:06    Procedures Procedures    Medications Ordered in ED Medications  aspirin EC tablet 81 mg (has no administration in time range)  amLODipine (NORVASC) tablet 5 mg (has no administration in time range)  atorvastatin (LIPITOR) tablet 10 mg (has no administration in time range)  clopidogrel (PLAVIX) tablet 75 mg (has no administration in time range)  gabapentin (NEURONTIN) capsule 300 mg (has no administration in time range)  albuterol (PROVENTIL) (2.5 MG/3ML) 0.083% nebulizer solution 3 mL (has no administration in time range)  cefTRIAXone (ROCEPHIN) 1 g in sodium chloride 0.9 % 100 mL IVPB (has no administration in time range)  enoxaparin (LOVENOX) injection 40 mg (has no administration in time  range)  sodium chloride flush (NS) 0.9 % injection 3 mL (has no administration in time range)  sodium chloride flush (NS) 0.9 % injection 3 mL (has no administration in time range)  0.9 %  sodium chloride infusion (has no administration in time range)  acetaminophen (TYLENOL)  tablet 650 mg (has no administration in time range)    Or  acetaminophen (TYLENOL) suppository 650 mg (has no administration in time range)  senna-docusate (Senokot-S) tablet 1 tablet (has no administration in time range)  ondansetron (ZOFRAN) tablet 4 mg (has no administration in time range)    Or  ondansetron (ZOFRAN) injection 4 mg (has no administration in time range)  hydrALAZINE (APRESOLINE) injection 10 mg (has no administration in time range)  insulin detemir (LEVEMIR) injection 10 Units (has no administration in time range)  insulin aspart (novoLOG) injection 0-6 Units (has no administration in time range)  insulin aspart (novoLOG) injection 0-5 Units (has no administration in time range)  insulin aspart (novoLOG) injection 5 Units (has no administration in time range)  butalbital-acetaminophen-caffeine (FIORICET) 50-325-40 MG per tablet 1 tablet (has no administration in time range)  lactated ringers bolus 500 mL (0 mLs Intravenous Stopped 04/15/23 1730)  ondansetron (ZOFRAN) injection 4 mg (4 mg Intravenous Given 04/15/23 1643)  cefTRIAXone (ROCEPHIN) 1 g in sodium chloride 0.9 % 100 mL IVPB (0 g Intravenous Stopped 04/15/23 1730)  iohexol (OMNIPAQUE) 350 MG/ML injection 60 mL (60 mLs Intravenous Contrast Given 04/15/23 1836)    ED Course/ Medical Decision Making/ A&P                                 Medical Decision Making Amount and/or Complexity of Data Reviewed Labs: ordered. Radiology: ordered.  Risk Prescription drug management. Decision regarding hospitalization.   Medical Decision Making:   Nathaniel Colon is a 62 y.o. male who presented to the ED today with myalgias, chills, foul-smelling urine,  abdominal pain.  Vital signs reviewed noted for hypertension.  On exam he is overall well-appearing.  Reports multiple concerns concern for possible infectious.  With no signs of sepsis.  Will evaluate for possible UTI, intra-abdominal flexion, cholecystitis, appendicitis.  He does have some mild headache but I think this is probably secondary to his recent vomiting and dehydration, no meningismus or concerning historical or exam findings.  Will plan to obtain chest x-ray, CT abdomen pelvis and treat symptomatically.  Will also test for COVID.   Patient placed on continuous vitals and telemetry monitoring while in ED which was reviewed periodically.  Reviewed and confirmed nursing documentation for past medical history, family history, social history.  Reassessment and Plan:   EKG unremarkable, no signs of ischemia or arrhythmia.  Urinalysis concerning for UTI which is consistent with his symptoms.  Will treat with Rocephin.  Blood work otherwise notable for mild leukocytosis, s hyperglycemia without evidence of DKA.  Renal functions at baseline.  CT abdomen pelvis is unremarkable.  No signs of infected stone or other acute abnormality.  He is feeling slightly better on reassessment, however he still feels very dizzy when he stands up.  I ordered some additional fluids.  He is probably dehydrated from UTI as well as poorly controlled diabetes.  Unfortunately has no social support at home, and is unsteady when he walks.  Given this I discussed with the hospitalist, they are amenable to admitting him to watch him overnight, treat his UTI, rehydrate.   Patient's presentation is most consistent with acute complicated illness / injury requiring diagnostic workup.           Final Clinical Impression(s) / ED Diagnoses Final diagnoses:  Urinary tract infection without hematuria, site unspecified    Rx / DC Orders ED Discharge Orders  None         Laurence Spates, MD 04/15/23 [id number]

## 2023-04-15 NOTE — ED Triage Notes (Signed)
Pt arrived via [facility] from home for HA, cold sweats, frequent urination and foul smell to urinex2d. Pt wears 2L 02 per [place] at baseline. Pt states took tylenol for HA, but it didn't help

## 2023-04-15 NOTE — ED Provider Triage Note (Signed)
Emergency Medicine Provider Triage Evaluation Note  AYVEN CAHAN , a 62 y.o. male  was evaluated in triage.  Pt complains of frequent urination, nausea, vomiting, intermittent abdominal pain.  Review of Systems  Positive:  Negative:   Physical Exam  BP (!) 156/108 (BP Location: Right Arm)   Pulse 91   Temp 99.1 F (37.3 C) (Oral)   Resp 20   Ht 6\' 2"  (1.88 m)   Wt 96.3 kg   SpO2 98%   BMI 27.26 kg/m  Gen:   Awake, no distress   Resp:  Normal effort  MSK:   Moves extremities without difficulty  Other:    Medical Decision Making  Medically screening exam initiated at 12:13 PM.  Appropriate orders placed.  LYNCH GUCKERT was informed that the remainder of the evaluation will be completed by another provider, this initial triage assessment does not replace that evaluation, and the importance of remaining in the ED until their evaluation is complete.  Concerned for headache, frequent urination, malodorous urine x2-3 days. Also nausea and vomiting since last night.  Intermittent generalized abdominal pain over the past years.  Denies fever, chest pain, diarrhea, hematuria, dysuria, hematochezia, hematemesis.   Patient is mildly tachycardic at 91BPM, but otherwise not meeting SIRS criteria during triage. No hypotension or fever.    Dorthy Cooler, [place] 04/15/23 1219

## 2023-04-16 ENCOUNTER — Inpatient Hospital Stay (HOSPITAL_COMMUNITY): Payer: No Typology Code available for payment source

## 2023-04-16 DIAGNOSIS — Z794 Long term (current) use of insulin: Secondary | ICD-10-CM

## 2023-04-16 DIAGNOSIS — K219 Gastro-esophageal reflux disease without esophagitis: Secondary | ICD-10-CM | POA: Diagnosis present

## 2023-04-16 DIAGNOSIS — E7800 Pure hypercholesterolemia, unspecified: Secondary | ICD-10-CM | POA: Diagnosis present

## 2023-04-16 DIAGNOSIS — Z1152 Encounter for screening for COVID-19: Secondary | ICD-10-CM | POA: Diagnosis not present

## 2023-04-16 DIAGNOSIS — E871 Hypo-osmolality and hyponatremia: Secondary | ICD-10-CM | POA: Diagnosis present

## 2023-04-16 DIAGNOSIS — N3000 Acute cystitis without hematuria: Secondary | ICD-10-CM

## 2023-04-16 DIAGNOSIS — Z87891 Personal history of nicotine dependence: Secondary | ICD-10-CM | POA: Diagnosis not present

## 2023-04-16 DIAGNOSIS — F329 Major depressive disorder, single episode, unspecified: Secondary | ICD-10-CM | POA: Diagnosis present

## 2023-04-16 DIAGNOSIS — I5022 Chronic systolic (congestive) heart failure: Secondary | ICD-10-CM

## 2023-04-16 DIAGNOSIS — Z955 Presence of coronary angioplasty implant and graft: Secondary | ICD-10-CM | POA: Diagnosis not present

## 2023-04-16 DIAGNOSIS — E1122 Type 2 diabetes mellitus with diabetic chronic kidney disease: Secondary | ICD-10-CM | POA: Diagnosis present

## 2023-04-16 DIAGNOSIS — I739 Peripheral vascular disease, unspecified: Secondary | ICD-10-CM

## 2023-04-16 DIAGNOSIS — I5031 Acute diastolic (congestive) heart failure: Secondary | ICD-10-CM

## 2023-04-16 DIAGNOSIS — I2510 Atherosclerotic heart disease of native coronary artery without angina pectoris: Secondary | ICD-10-CM | POA: Diagnosis present

## 2023-04-16 DIAGNOSIS — Z8679 Personal history of other diseases of the circulatory system: Secondary | ICD-10-CM

## 2023-04-16 DIAGNOSIS — I700 Atherosclerosis of aorta: Secondary | ICD-10-CM | POA: Diagnosis present

## 2023-04-16 DIAGNOSIS — E11319 Type 2 diabetes mellitus with unspecified diabetic retinopathy without macular edema: Secondary | ICD-10-CM | POA: Diagnosis present

## 2023-04-16 DIAGNOSIS — I10 Essential (primary) hypertension: Secondary | ICD-10-CM

## 2023-04-16 DIAGNOSIS — Z7984 Long term (current) use of oral hypoglycemic drugs: Secondary | ICD-10-CM | POA: Diagnosis not present

## 2023-04-16 DIAGNOSIS — I130 Hypertensive heart and chronic kidney disease with heart failure and stage 1 through stage 4 chronic kidney disease, or unspecified chronic kidney disease: Secondary | ICD-10-CM | POA: Diagnosis present

## 2023-04-16 DIAGNOSIS — E860 Dehydration: Secondary | ICD-10-CM | POA: Diagnosis present

## 2023-04-16 DIAGNOSIS — R739 Hyperglycemia, unspecified: Secondary | ICD-10-CM | POA: Diagnosis not present

## 2023-04-16 DIAGNOSIS — N390 Urinary tract infection, site not specified: Secondary | ICD-10-CM | POA: Diagnosis present

## 2023-04-16 DIAGNOSIS — Z79899 Other long term (current) drug therapy: Secondary | ICD-10-CM | POA: Diagnosis not present

## 2023-04-16 DIAGNOSIS — J9611 Chronic respiratory failure with hypoxia: Secondary | ICD-10-CM | POA: Diagnosis present

## 2023-04-16 DIAGNOSIS — E1165 Type 2 diabetes mellitus with hyperglycemia: Secondary | ICD-10-CM | POA: Diagnosis present

## 2023-04-16 DIAGNOSIS — N1831 Chronic kidney disease, stage 3a: Secondary | ICD-10-CM | POA: Diagnosis present

## 2023-04-16 DIAGNOSIS — N401 Enlarged prostate with lower urinary tract symptoms: Secondary | ICD-10-CM | POA: Diagnosis not present

## 2023-04-16 DIAGNOSIS — E1151 Type 2 diabetes mellitus with diabetic peripheral angiopathy without gangrene: Secondary | ICD-10-CM | POA: Diagnosis present

## 2023-04-16 DIAGNOSIS — E1142 Type 2 diabetes mellitus with diabetic polyneuropathy: Secondary | ICD-10-CM | POA: Diagnosis present

## 2023-04-16 DIAGNOSIS — N400 Enlarged prostate without lower urinary tract symptoms: Secondary | ICD-10-CM | POA: Diagnosis present

## 2023-04-16 LAB — COMPREHENSIVE METABOLIC PANEL
ALT: 17 U/L (ref 0–44)
AST: 14 U/L — ABNORMAL LOW (ref 15–41)
Albumin: 3.5 g/dL (ref 3.5–5.0)
Alkaline Phosphatase: 102 U/L (ref 38–126)
Anion gap: 11 (ref 5–15)
BUN: 22 mg/dL — ABNORMAL HIGH (ref 6–20)
CO2: 25 mmol/L (ref 22–32)
Calcium: 9 mg/dL (ref 8.9–10.3)
Chloride: 98 mmol/L (ref 98–111)
Creatinine, Ser: 1.49 mg/dL — ABNORMAL HIGH (ref 0.61–1.24)
GFR, Estimated: 53 mL/min — ABNORMAL LOW (ref >60)
Glucose, Bld: 285 mg/dL — ABNORMAL HIGH (ref 70–99)
Potassium: 4.3 mmol/L (ref 3.5–5.1)
Sodium: 134 mmol/L — ABNORMAL LOW (ref 135–145)
Total Bilirubin: 0.9 mg/dL (ref 0.3–1.2)
Total Protein: 7.5 g/dL (ref 6.5–8.1)

## 2023-04-16 LAB — GLUCOSE, CAPILLARY
Glucose-Capillary: 268 mg/dL — ABNORMAL HIGH (ref 70–99)
Glucose-Capillary: 306 mg/dL — ABNORMAL HIGH (ref 70–99)
Glucose-Capillary: 340 mg/dL — ABNORMAL HIGH (ref 70–99)
Glucose-Capillary: 344 mg/dL — ABNORMAL HIGH (ref 70–99)
Glucose-Capillary: 352 mg/dL — ABNORMAL HIGH (ref 70–99)

## 2023-04-16 LAB — ECHOCARDIOGRAM COMPLETE
Area-P 1/2: 3.37 cm2
Height: 74 in
MV M vel: 0.5 m/s
MV Peak grad: 1 mmHg
S' Lateral: 3.4 cm
Weight: 3315.72 [oz_av]

## 2023-04-16 LAB — CBC
HCT: 47.7 % (ref 39.0–52.0)
Hemoglobin: 15.1 g/dL (ref 13.0–17.0)
MCH: 27.5 pg (ref 26.0–34.0)
MCHC: 31.7 g/dL (ref 30.0–36.0)
MCV: 86.9 fL (ref 80.0–100.0)
Platelets: 254 10*3/uL (ref 150–400)
RBC: 5.49 MIL/uL (ref 4.22–5.81)
RDW: 14.6 % (ref 11.5–15.5)
WBC: 7.6 10*3/uL (ref 4.0–10.5)
nRBC: 0 % (ref 0.0–0.2)

## 2023-04-16 MED ORDER — ACETAMINOPHEN 325 MG PO TABS: 650.0000 mg | ORAL_TABLET | Freq: Four times a day (QID) | ORAL | Status: DC | PRN

## 2023-04-16 MED ORDER — UMECLIDINIUM BROMIDE 62.5 MCG/ACT IN AEPB
1.0000 | INHALATION_SPRAY | Freq: Every day | RESPIRATORY_TRACT | Status: DC
  Administered 2023-04-17: 1 via RESPIRATORY_TRACT
  Filled 2023-04-16: qty 7

## 2023-04-16 MED ORDER — NITROGLYCERIN 0.4 MG SL SUBL: 0.4000 mg | SUBLINGUAL_TABLET | SUBLINGUAL | Status: DC | PRN

## 2023-04-16 MED ORDER — PANTOPRAZOLE SODIUM 40 MG PO TBEC
40.0000 mg | DELAYED_RELEASE_TABLET | Freq: Every day | ORAL | Status: DC
  Administered 2023-04-16 – 2023-04-17 (×2): 40 mg via ORAL
  Filled 2023-04-16 (×2): qty 1

## 2023-04-16 MED ORDER — VITAMIN B-12 1000 MCG PO TABS
1000.0000 ug | ORAL_TABLET | Freq: Every day | ORAL | Status: DC
  Administered 2023-04-16 – 2023-04-17 (×2): 1000 ug via ORAL
  Filled 2023-04-16: qty 1
  Filled 2023-04-16 (×2): qty 2

## 2023-04-16 MED ORDER — LIDOCAINE 5 % EX PTCH
1.0000 | MEDICATED_PATCH | CUTANEOUS | Status: DC
  Administered 2023-04-16: 1 via TRANSDERMAL
  Filled 2023-04-16: qty 1

## 2023-04-16 MED ORDER — DULOXETINE HCL 60 MG PO CPEP
60.0000 mg | ORAL_CAPSULE | Freq: Every day | ORAL | Status: DC
  Administered 2023-04-16 – 2023-04-17 (×2): 60 mg via ORAL
  Filled 2023-04-16 (×2): qty 1

## 2023-04-16 MED ORDER — SENNOSIDES-DOCUSATE SODIUM 8.6-50 MG PO TABS
2.0000 | ORAL_TABLET | Freq: Every day | ORAL | Status: DC
  Administered 2023-04-16: 2 via ORAL
  Filled 2023-04-16: qty 2

## 2023-04-16 MED ORDER — METOPROLOL SUCCINATE ER 25 MG PO TB24
25.0000 mg | ORAL_TABLET | Freq: Every day | ORAL | Status: DC
  Administered 2023-04-16 – 2023-04-17 (×2): 25 mg via ORAL
  Filled 2023-04-16 (×2): qty 1

## 2023-04-16 MED ORDER — DOXYCYCLINE HYCLATE 100 MG PO TABS
100.0000 mg | ORAL_TABLET | Freq: Two times a day (BID) | ORAL | Status: DC
  Administered 2023-04-16 – 2023-04-17 (×3): 100 mg via ORAL
  Filled 2023-04-16 (×3): qty 1

## 2023-04-16 MED ORDER — POLYVINYL ALCOHOL 1.4 % OP SOLN: 1.0000 [drp] | OPHTHALMIC | Status: DC | PRN

## 2023-04-16 MED ORDER — FINASTERIDE 5 MG PO TABS
5.0000 mg | ORAL_TABLET | Freq: Every day | ORAL | Status: DC
  Administered 2023-04-16 – 2023-04-17 (×2): 5 mg via ORAL
  Filled 2023-04-16 (×2): qty 1

## 2023-04-16 MED ORDER — ASPIRIN-ACETAMINOPHEN-CAFFEINE 250-250-65 MG PO TABS
1.0000 | ORAL_TABLET | Freq: Three times a day (TID) | ORAL | Status: DC | PRN
  Administered 2023-04-16: 1 via ORAL
  Filled 2023-04-16 (×3): qty 1

## 2023-04-16 MED ORDER — FERROUS SULFATE 325 (65 FE) MG PO TABS
325.0000 mg | ORAL_TABLET | ORAL | Status: DC
  Administered 2023-04-16: 325 mg via ORAL
  Filled 2023-04-16: qty 1

## 2023-04-16 MED ORDER — VITAMIN D 25 MCG (1000 UNIT) PO TABS
1000.0000 [IU] | ORAL_TABLET | Freq: Every day | ORAL | Status: DC
  Administered 2023-04-16 – 2023-04-17 (×2): 1000 [IU] via ORAL
  Filled 2023-04-16 (×2): qty 1

## 2023-04-16 MED ORDER — DICLOFENAC SODIUM 1 % EX GEL: 2.0000 g | Freq: Four times a day (QID) | CUTANEOUS | Status: DC | PRN

## 2023-04-16 MED ORDER — FLUTICASONE FUROATE-VILANTEROL 100-25 MCG/ACT IN AEPB
1.0000 | INHALATION_SPRAY | Freq: Every day | RESPIRATORY_TRACT | Status: DC
  Administered 2023-04-17: 1 via RESPIRATORY_TRACT
  Filled 2023-04-16: qty 28

## 2023-04-16 MED ORDER — TAMSULOSIN HCL 0.4 MG PO CAPS
0.8000 mg | ORAL_CAPSULE | Freq: Every day | ORAL | Status: DC
  Administered 2023-04-16: 0.8 mg via ORAL
  Filled 2023-04-16: qty 2

## 2023-04-16 MED ORDER — ALUM & MAG HYDROXIDE-SIMETH 200-200-20 MG/5ML PO SUSP: 10.0000 mL | ORAL | Status: DC | PRN

## 2023-04-16 NOTE — Inpatient Diabetes Management (Addendum)
Inpatient Diabetes Program Recommendations  AACE/ADA: New Consensus Statement on Inpatient Glycemic Control (2015)  Target Ranges:  Prepandial:   less than 140 mg/dL      Peak postprandial:   less than 180 mg/dL (1-2 hours)      Critically ill patients:  140 - 180 mg/dL   Lab Results  Component Value Date   GLUCAP 340 (H) 04/16/2023   HGBA1C 10.4 (H) 04/15/2023    Review of Glycemic Control  Latest Reference Range & Units 04/15/23 23:20 04/16/23 00:05 04/16/23 07:50 04/16/23 12:17  Glucose-Capillary 70 - 99 mg/dL 161 (H) 096 (H) 045 (H) 340 (H)   Diabetes history: DM 2 Outpatient Diabetes medications: Semglee 40 units bid, Novolog 4-8 units tid Current orders for Inpatient glycemic control:  Novolog 0-6 units tid + hs Levemir 10 units qhs  A1c 10.4% this admission  Inpatient Diabetes Program Recommendations:    -  Increase Levemir to 15 units bid -  Add Novolog 4 units tid meal coverage if eating >50% of meals  Spoke with pt at bedside regarding A1c level and glucose control. Pt reports having chronically high A1c and that he has struggled to control his glucose levels. Pt also reports always being controlled during the day and at night spiking up into the 300 range. Pt PCP cannot seem to find a pattern to be able to have tighter control over glucose trends at home. Pt consistently takes his medications all the time. Pt sees a PCP at the [facility] for DM management. At last md visit pt was placed on a oral medication januvia 50 mg Daily listed in home med rec. Pt has all supplies needed at home. Pt is aware of glucose goals to achieve. Pt also would like to have better control of glucose levels while inpatient and states he takes a lot more insulin at home.   Will follow trends while inpatient.   Thanks,  Christena Deem RN, MSN, BC-ADM Inpatient Diabetes Coordinator Team Pager [phone] (8a-5p)

## 2023-04-16 NOTE — Progress Notes (Signed)
  Echocardiogram 2D Echocardiogram has been performed.  Nathaniel Colon 04/16/2023, 3:29 PM

## 2023-04-16 NOTE — Hospital Course (Signed)
Nathaniel Colon is a 62 y.o. male with medical history significant of chronic hypoxic respiratory failure on oxygen 2 L at baseline, GERD, diabetes type 2, peripheral vascular disease, CAD, congestive heart failure, chronic kidney disease, diabetic retinopathy, essential hypertension, hypercholesterolemia, peripheral neuropathy  major depressive disorder, BPH, and obstructive sleep apnea presented to emergency department with complaints of excessive urination, cold sweats and fall order to the urine for 2 days.  Patient had ran out of his blood pressure medications.  He also complained of throbbing headache with some dizziness.  In the ED patient was noted to have blood pressure of 178/107 urine analysis with glucose more than 500 with ketone positive.  CMP showed mild hyponatremia with sodium of 132 creatinine elevated at 1.7.  CBC showed mild leukocytosis at 13.2.  Chest x-ray without any acute infiltrate.   CT abdomen pelvis no acute finding in the abdomen pelvis.  Left lower pole nephrolithiasis.  No hydronephrosis.  In the ED, patient received 1 dose of IV ceftriaxone.  Patient was then considered for admission in the hospital for further evaluation and treatment.    Assessment and Plan: Acute cystitis Presented with fever chills and urinary symptoms.  Urinalysis abnormal.  Afebrile but leukocytosis present.  CT scan showed left lower pole nephrolithiasis but no hydronephrosis.  Continue Rocephin daily.  Blood cultures negative in less than 12 hours.  Urine culture pending.  HIV nonreactive.  Temperature max of 99.1 F.   Hyperglycemia Insulin-dependent DM type II Ran out of insulin for the last 2 weeks.  Home regimen consists of NovoLog 35 units 3 times daily with meals, Lantus 70 unit daily and Novolin 18-20 unit twice daily with meals and metformin 1000 mg with breakfast.  Pharmacy med rec pending at this time. Continue Lantus sliding scale insulin Accu-Cheks.  Diabetic coordinator consultation.   Latest blood glucose level of 268.   Uncontrolled blood pressure History of essential hypertension History of CHF No 2D echocardiogram on record.  Blood pressure elevated on presentation.  Had ran out of medications.  Continue amlodipine, as needed hydralazine.      CKD stage IIIa Left lower pole nephrolithiasis creatinine 1.7 and GFR 43.  Creatinine 10 months ago was 1.8.  Could be at baseline.  Marland KitchenHistory of coronary artery disease Continue aspirin Plavix and Crestor.   Peripheral vascular disease Continue aspirin and Plavix.   Hyperlipidemia -Continue Crestor    Diabetic polyneuropathy -Continue gabapentin    Chronic hypoxic respiratory failure History of OSA On 2 L of oxygen at baseline.  Continue albuterol as needed.   Cholelithiasis - CT abdomen pelvis showed incidental finding of cholelithiasis.  No evidence of acute cholecystitis.

## 2023-04-16 NOTE — Progress Notes (Signed)
PROGRESS NOTE    Nathaniel Colon  XBJ:[id number] DOB: 16-Dec-1960 DOA: 04/15/2023 PCP: Oneita Hurt, No    Brief Narrative:  Nathaniel Colon is a 62 y.o. male with medical history significant of chronic hypoxic respiratory failure on oxygen 2 L at baseline, GERD, diabetes type 2, peripheral vascular disease, CAD, congestive heart failure, chronic kidney disease, diabetic retinopathy, essential hypertension, hypercholesterolemia, peripheral neuropathy  major depressive disorder, BPH, and obstructive sleep apnea presented to emergency department with complaints of excessive urination, cold sweats and fall order to the urine for 2 days.  Patient had ran out of his blood pressure medications.  He also complained of throbbing headache with some dizziness.  In the ED patient was noted to have blood pressure of 178/107 urine analysis with glucose more than 500 with ketone positive.  CMP showed mild hyponatremia with sodium of 132 creatinine elevated at 1.7.  CBC showed mild leukocytosis at 13.2.  Chest x-ray without any acute infiltrate.   CT abdomen pelvis no acute finding in the abdomen pelvis.  Left lower pole nephrolithiasis.  No hydronephrosis.  In the ED, patient received 1 dose of IV ceftriaxone.  Patient was then considered for admission in the hospital for further evaluation and treatment.    Assessment and Plan:  Acute cystitis Presented with fever chills and urinary symptoms.  Urinalysis abnormal.  Afebrile but leukocytosis present.  CT scan showed left lower pole nephrolithiasis but no hydronephrosis.  Continue Rocephin daily.  Blood cultures negative in less than 12 hours.  Urine culture pending.  HIV nonreactive.  Temperature max of 99.1 F.  Still complains of foul odor to the urine.   Hyperglycemia Insulin-dependent DM type II Ran out of insulin for the last 2 weeks.  Home regimen consists of NovoLog 35 units 3 times daily with meals, Lantus 70 unit daily and Novolin 18-20 unit twice daily with meals  and metformin 1000 mg with breakfast.  Continue Lantus sliding scale insulin Accu-Cheks.  Diabetic coordinator consultation.  Latest blood glucose level of 268.  Facial pain, jaw pain, sinus tenderness.  Patient is alert and Rocephin.  Add doxycycline.  Possibility of sinusitis.  Influenza and COVID was negative.   Uncontrolled blood pressure History of essential hypertension History of CHF No 2D echocardiogram on record.  Blood pressure elevated on presentation.  Had ran out of medications.  Continue amlodipine, as needed hydralazine.   Will check 2D echocardiogram.   CKD stage IIIa Left lower pole nephrolithiasis creatinine 1.7 and GFR 43.  Creatinine 10 months ago was 1.8.  Could be at baseline.  Marland KitchenHistory of coronary artery disease Continue aspirin Plavix and Crestor.   Peripheral vascular disease Continue aspirin and Plavix.   Hyperlipidemia -Continue Crestor    Diabetic polyneuropathy -Continue gabapentin    Chronic hypoxic respiratory failure History of OSA On 2 L of oxygen at baseline.  Continue albuterol as needed.  Resume inhalers from home.   Cholelithiasis  CT abdomen pelvis showed incidental finding of cholelithiasis.  No evidence of acute cholecystitis.  Asymptomatic at this time.       DVT prophylaxis: enoxaparin (LOVENOX) injection 40 mg Start: 04/16/23 1000 SCDs Start: 04/15/23 2131   Code Status:     Code Status: Full Code  Disposition: Home likely 04/17/2023.  Status is: Observation The patient will require care spanning > 2 midnights and should be moved to inpatient because: Urine culture pending, pending clinical improvement   Family Communication: None at bedside  Consultants:  None  Procedures:  None  Antimicrobials:  Rocephin and will add doxycycline  Anti-infectives (From admission, onward)    Start     Dose/Rate Route Frequency Ordered Stop   04/16/23 1200  cefTRIAXone (ROCEPHIN) 1 g in sodium chloride 0.9 % 100 mL IVPB        1  g 200 mL/hr over 30 Minutes Intravenous Every 24 hours 04/15/23 2130 04/22/23 1159   04/15/23 1615  cefTRIAXone (ROCEPHIN) 1 g in sodium chloride 0.9 % 100 mL IVPB        1 g 200 mL/hr over 30 Minutes Intravenous  Once 04/15/23 1612 04/15/23 1730       Subjective: Today, patient was seen and examined at bedside.  Patient complains of multiple symptoms including facial pain and headache sinus tenderness jaw pain tooth ache and generalized weakness and fatigue.  Has foul odor to the urine.  Complains of chills myalgia.  Objective: Vitals:   04/15/23 2357 04/16/23 0004 04/16/23 0349 04/16/23 0752  BP:  (!) 163/92 (!) 153/86 (!) 133/93  Pulse:  74 78 88  Resp:  18 18 17   Temp:  98.7 F (37.1 C) 98 F (36.7 C) 98.2 F (36.8 C)  TempSrc:  Oral Oral   SpO2:  98% 97% 96%  Weight: 94 kg  94 kg   Height: 6\' 2"  (1.88 m)       Intake/Output Summary (Last 24 hours) at 04/16/2023 1139 Last data filed at 04/15/2023 1730 Gross per 24 hour  Intake 99.23 ml  Output --  Net 99.23 ml   Filed Weights   04/15/23 1209 04/15/23 2357 04/16/23 0349  Weight: 96.3 kg 94 kg 94 kg    Physical Examination: Body mass index is 26.61 kg/m.  General:  Average built, not in obvious distress, on room air HENT:   No scleral pallor or icterus noted. Oral mucosa is moist.  Tenderness over the maxillary sinus on the left.  Oral cavity with feelings without obvious gingival swelling or abscess.   Chest:  Clear breath sounds.  No crackles or wheezes.  CVS: S1 &S2 heard. No murmur.  Regular rate and rhythm. Abdomen: Soft, nontender, nondistended.  Bowel sounds are heard.   Extremities: No cyanosis, clubbing or edema.  Peripheral pulses are palpable. Psych: Alert, awake and oriented, normal mood CNS:  No cranial nerve deficits.  Power equal in all extremities.   Skin: Warm and dry.  No rashes noted.  Data Reviewed:   CBC: Recent Labs  Lab 04/15/23 1212 04/16/23 0757  WBC 13.2* 7.6  HGB 14.8 15.1  HCT  46.6 47.7  MCV 87.6 86.9  PLT 247 254    Basic Metabolic Panel: Recent Labs  Lab 04/15/23 1212 04/16/23 0757  NA 132* 134*  K 4.7 4.3  CL 100 98  CO2 22 25  GLUCOSE 403* 285*  BUN 23* 22*  CREATININE 1.78* 1.49*  CALCIUM 8.7* 9.0    Liver Function Tests: Recent Labs  Lab 04/15/23 1212 04/16/23 0757  AST 19 14*  ALT 20 17  ALKPHOS 107 102  BILITOT 1.2 0.9  PROT 7.2 7.5  ALBUMIN 3.5 3.5     Radiology Studies: CT ABDOMEN PELVIS W CONTRAST  Result Date: 04/15/2023 CLINICAL DATA:  Abdominal pain, acute, nonlocalized EXAM: CT ABDOMEN AND PELVIS WITH CONTRAST TECHNIQUE: Multidetector CT imaging of the abdomen and pelvis was performed using the standard protocol following bolus administration of intravenous contrast. RADIATION DOSE REDUCTION: This exam was performed according to the departmental dose-optimization program which includes automated  exposure control, adjustment of the mA and/or kV according to patient size and/or use of iterative reconstruction technique. CONTRAST:  60mL OMNIPAQUE IOHEXOL 350 MG/ML SOLN COMPARISON:  02/17/2015 FINDINGS: Lower chest: No acute abnormality. Small hiatal hernia. Coronary artery calcifications in the visualized right coronary artery. Hepatobiliary: Small layering gallstones within the gallbladder. No biliary ductal dilatation. No focal hepatic abnormality. Pancreas: No focal abnormality or ductal dilatation. Spleen: No focal abnormality.  Normal size. Adrenals/Urinary Tract: Left adrenal nodule again noted, measuring 1.7 cm, decreased in size since prior study compatible with benign adenoma. Right adrenal gland normal. 8 mm nonobstructing stone in the lower pole of the left kidney. No ureteral stones or hydronephrosis bilaterally. Urinary bladder unremarkable. Stomach/Bowel: Normal appendix. Stomach, large and small bowel grossly unremarkable. Vascular/Lymphatic: Aortic atherosclerosis. No evidence of aneurysm or adenopathy. Reproductive: No  visible focal abnormality. Other: No free fluid or free air. Musculoskeletal: No acute bony abnormality. IMPRESSION: No acute findings in the abdomen or pelvis. Left lower pole nephrolithiasis.  No hydronephrosis. Cholelithiasis.  No CT evidence of acute cholecystitis. Coronary artery disease, aortic atherosclerosis. Electronically Signed   By: Charlett Nose M.D.   On: 04/15/2023 20:25   DG Chest 2 View  Result Date: 04/15/2023 CLINICAL DATA:  Chills and weakness. EXAM: CHEST - 2 VIEW COMPARISON:  12/16/2022 FINDINGS: The heart is normal in size. The cardiomediastinal contours are normal. The lungs are clear. Pulmonary vasculature is normal. No consolidation, pleural effusion, or pneumothorax. No acute osseous abnormalities are seen. IMPRESSION: No acute chest findings. Electronically Signed   By: Narda Rutherford M.D.   On: 04/15/2023 19:06      LOS: 0 days   Joycelyn Das, MD [facility] Available via Epic secure chat 7am-7pm After these hours, please refer to coverage provider listed on [email or web address] 04/16/2023, 11:39 AM

## 2023-04-16 NOTE — TOC Initial Note (Addendum)
Transition of Care (TOC) - Initial/Assessment Note   Spoke to patient at bedside. Patient's PCP is at [facility], patient has only seen current PCP one time and does not recall PCP's name.   [facility], was told they have [facility] medications via mail. Patient reports normally it takes up to 10 days to receive medications . It has been 2 weeks. Patient called [facility] back , and was told "they are on the way".   NCM has left two messages with [facility].   Patient does have [employer] Medicaid . Copy of card sent to financial advisor and admitting.   Member ID [id number]  Medicaid # [id number]  RxBIN [id number]  RxPCN [place] RxGRP [id number]    Patient can use [facility] Pharmacy and Medicaid to get medications filled, if MD provides prescriptions.   At discharge patient's brother can provide transportation home.   Received call back from [place], patient's PCP is Dr Ronaldo Miyamoto , [facility] social worker is Delila Pereyra [phone] ext [phone] . Per [facility] documentation patient's medications were mailed 04/10/23 .   Left message for [facility] social worker is Delila Pereyra [phone] .   Patient aware of above [facility] information.   Patient has home oxygen through [place] through [place] [employer], he has portable tank at home. Brother will need to bring at discharge. Patient aware.  Patient Details  Name: Nathaniel Colon MRN: [id number] Date of Birth: 06-29-1961  Transition of Care [facility]) CM/SW Contact:    Kingsley Plan, RN Phone Number: 04/16/2023, 8:31 AM  Clinical Narrative:                   Expected Discharge Plan: Home/Self Care Barriers to Discharge: Continued Medical Work up   Patient Goals and CMS Choice Patient states their goals for this hospitalization and ongoing recovery are:: to return home   Choice offered to / list presented to : NA      Expected Discharge Plan and Services In-house Referral: Financial Counselor Discharge Planning Services: CM Consult Post Acute Care  Choice: NA Living arrangements for the past 2 months: Single Family Home                   DME Agency: NA       HH Arranged: NA          Prior Living Arrangements/Services Living arrangements for the past 2 months: Single Family Home Lives with:: Self Patient language and need for interpreter reviewed:: Yes Do you feel safe going back to the place where you live?: Yes      Need for Family Participation in Patient Care: No (Comment) Care giver support system in place?: Yes (comment)   Criminal Activity/Legal Involvement Pertinent to Current Situation/Hospitalization: No - Comment as needed  Activities of Daily Living Home Assistive Devices/Equipment: Dan Humphreys (specify type) ADL Screening (condition at time of admission) Patient's cognitive ability adequate to safely complete daily activities?: Yes Is the patient deaf or have difficulty hearing?: No Does the patient have difficulty seeing, even when wearing glasses/contacts?: No Does the patient have difficulty concentrating, remembering, or making decisions?: No Patient able to express need for assistance with ADLs?: Yes Does the patient have difficulty dressing or bathing?: No Independently performs ADLs?: Yes (appropriate for developmental age) Does the patient have difficulty walking or climbing stairs?: No Weakness of Legs: None Weakness of Arms/Hands: None  Permission Sought/Granted   Permission granted to share information with :  Yes, Verbal Permission Granted  Share Information with NAME: [facility]           Emotional Assessment Appearance:: Appears stated age Attitude/Demeanor/Rapport: Engaged Affect (typically observed): Accepting Orientation: : Oriented to Self, Oriented to Place, Oriented to  Time, Oriented to Situation Alcohol / Substance Use: Not Applicable Psych Involvement: No (comment)  Admission diagnosis:  Acute cystitis [N30.00] Urinary tract infection without hematuria, site unspecified  [N39.0] Patient Active Problem List   Diagnosis Date Noted   Acute cystitis 04/15/2023   Insulin dependent type 2 diabetes mellitus (HCC) 04/15/2023   Hyperglycemia 04/15/2023   Chronic hypoxic respiratory failure (HCC) 04/15/2023   GERD (gastroesophageal reflux disease) 04/15/2023   History of CAD (coronary artery disease) 04/15/2023   Chronic systolic CHF (congestive heart failure) (HCC) 04/15/2023   PVD (peripheral vascular disease) (HCC) 04/15/2023   Chronic kidney disease due to type 2 diabetes mellitus (HCC) 04/15/2023   Hypercholesterolemia 04/15/2023   Peripheral neuropathy 04/15/2023   BPH (benign prostatic hyperplasia) 04/15/2023   Essential hypertension 04/15/2023   Dyspnea 12/30/2022   Hx of sleep apnea 12/30/2022   Unstable angina (HCC) 04/22/2016   Chest pain 03/15/2016   Stenosis, cervical spine 08/30/2011    Class: Diagnosis of   HNP (herniated nucleus pulposus), cervical 08/30/2011    Class: Diagnosis of   Right lateral epicondylitis 08/30/2011    Class: Diagnosis of   PCP:  Pcp, No Pharmacy:   [facility] [address]), [place] - [address] [place] ([place]) [place] [address] Phone: [phone] Fax: [phone]  [facility] - [place], [place] - [address] [place] [address] Phone: [phone] Fax: [phone]  [facility] [address] [place] [address] Phone: [phone] Fax: [phone]     Social Determinants of Health (SDOH) Social History: SDOH Screenings   Food Insecurity: No Food Insecurity (04/16/2023)  Housing: Patient Declined (04/16/2023)  Transportation Needs: No Transportation Needs (04/16/2023)  Utilities: Not At Risk (04/16/2023)  Social Connections: Unknown (12/23/2021)   Received from [facility]  Stress: Stress Concern Present (11/13/2022)   Received from [facility],  [facility]  Tobacco Use: High Risk (04/15/2023)   SDOH Interventions:     Readmission Risk Interventions     No data to display

## 2023-04-16 NOTE — Plan of Care (Signed)
  Problem: Pain Managment: Goal: General experience of comfort will improve Outcome: Progressing   Problem: Safety: Goal: Ability to remain free from injury will improve Outcome: Progressing   

## 2023-04-16 NOTE — TOC CM/SW Note (Signed)
[facility] to notify them of patient's admission and to get PCP information. Left message. Await call back

## 2023-04-17 ENCOUNTER — Other Ambulatory Visit (HOSPITAL_COMMUNITY): Payer: Self-pay

## 2023-04-17 DIAGNOSIS — K2100 Gastro-esophageal reflux disease with esophagitis, without bleeding: Secondary | ICD-10-CM

## 2023-04-17 DIAGNOSIS — R739 Hyperglycemia, unspecified: Secondary | ICD-10-CM | POA: Diagnosis not present

## 2023-04-17 DIAGNOSIS — I5022 Chronic systolic (congestive) heart failure: Secondary | ICD-10-CM | POA: Diagnosis not present

## 2023-04-17 DIAGNOSIS — J9611 Chronic respiratory failure with hypoxia: Secondary | ICD-10-CM | POA: Diagnosis not present

## 2023-04-17 DIAGNOSIS — N401 Enlarged prostate with lower urinary tract symptoms: Secondary | ICD-10-CM | POA: Diagnosis not present

## 2023-04-17 LAB — CBC
HCT: 47.3 % (ref 39.0–52.0)
Hemoglobin: 14.9 g/dL (ref 13.0–17.0)
MCH: 27.1 pg (ref 26.0–34.0)
MCHC: 31.5 g/dL (ref 30.0–36.0)
MCV: 86 fL (ref 80.0–100.0)
Platelets: 258 10*3/uL (ref 150–400)
RBC: 5.5 MIL/uL (ref 4.22–5.81)
RDW: 14.4 % (ref 11.5–15.5)
WBC: 10.2 10*3/uL (ref 4.0–10.5)
nRBC: 0 % (ref 0.0–0.2)

## 2023-04-17 LAB — COMPREHENSIVE METABOLIC PANEL
ALT: 18 U/L (ref 0–44)
AST: 17 U/L (ref 15–41)
Albumin: 3.4 g/dL — ABNORMAL LOW (ref 3.5–5.0)
Alkaline Phosphatase: 103 U/L (ref 38–126)
Anion gap: 8 (ref 5–15)
BUN: 23 mg/dL — ABNORMAL HIGH (ref 6–20)
CO2: 25 mmol/L (ref 22–32)
Calcium: 9 mg/dL (ref 8.9–10.3)
Chloride: 98 mmol/L (ref 98–111)
Creatinine, Ser: 1.63 mg/dL — ABNORMAL HIGH (ref 0.61–1.24)
GFR, Estimated: 48 mL/min — ABNORMAL LOW (ref >60)
Glucose, Bld: 323 mg/dL — ABNORMAL HIGH (ref 70–99)
Potassium: 4.7 mmol/L (ref 3.5–5.1)
Sodium: 131 mmol/L — ABNORMAL LOW (ref 135–145)
Total Bilirubin: 0.7 mg/dL (ref 0.3–1.2)
Total Protein: 7.1 g/dL (ref 6.5–8.1)

## 2023-04-17 LAB — GLUCOSE, CAPILLARY
Glucose-Capillary: 280 mg/dL — ABNORMAL HIGH (ref 70–99)
Glucose-Capillary: 278 mg/dL — ABNORMAL HIGH (ref 70–99)

## 2023-04-17 LAB — MAGNESIUM: Magnesium: 1.9 mg/dL (ref 1.7–2.4)

## 2023-04-17 MED ORDER — FERROUS SULFATE 325 (65 FE) MG PO TABS
325.0000 mg | ORAL_TABLET | ORAL | 3 refills | Status: AC
  Filled 2023-04-17: qty 100, 234d supply, fill #0

## 2023-04-17 MED ORDER — AMLODIPINE BESYLATE 5 MG PO TABS
5.0000 mg | ORAL_TABLET | Freq: Every day | ORAL | 2 refills | Status: AC
  Filled 2023-04-17: qty 30, 30d supply, fill #0

## 2023-04-17 MED ORDER — MELATONIN 5 MG PO TABS
5.0000 mg | ORAL_TABLET | Freq: Every evening | ORAL | Status: DC | PRN
  Administered 2023-04-17: 5 mg via ORAL
  Filled 2023-04-17: qty 1

## 2023-04-17 MED ORDER — SIMETHICONE 80 MG PO CHEW
80.0000 mg | CHEWABLE_TABLET | Freq: Four times a day (QID) | ORAL | Status: DC | PRN
  Administered 2023-04-17: 80 mg via ORAL
  Filled 2023-04-17: qty 1

## 2023-04-17 MED ORDER — BISACODYL 5 MG PO TBEC
5.0000 mg | DELAYED_RELEASE_TABLET | Freq: Every day | ORAL | Status: DC | PRN
  Administered 2023-04-17: 5 mg via ORAL
  Filled 2023-04-17: qty 1

## 2023-04-17 MED ORDER — CEPHALEXIN 500 MG PO CAPS
500.0000 mg | ORAL_CAPSULE | Freq: Four times a day (QID) | ORAL | 0 refills | Status: AC
  Filled 2023-04-17: qty 20, 5d supply, fill #0

## 2023-04-17 MED ORDER — FINASTERIDE 5 MG PO TABS
5.0000 mg | ORAL_TABLET | Freq: Every day | ORAL | 2 refills | Status: AC
  Filled 2023-04-17: qty 30, 30d supply, fill #0

## 2023-04-17 MED ORDER — ORAL CARE MOUTH RINSE: 15.0000 mL | OROMUCOSAL | Status: DC | PRN

## 2023-04-17 MED ORDER — INSULIN ASPART 100 UNIT/ML IJ SOLN
3.0000 [IU] | Freq: Once | INTRAMUSCULAR | Status: AC
  Administered 2023-04-17: 3 [IU] via SUBCUTANEOUS

## 2023-04-17 MED ORDER — GABAPENTIN 400 MG PO CAPS
800.0000 mg | ORAL_CAPSULE | Freq: Two times a day (BID) | ORAL | 2 refills | Status: DC
  Filled 2023-04-17: qty 60, 15d supply, fill #0

## 2023-04-17 MED ORDER — METOPROLOL SUCCINATE ER 50 MG PO TB24
25.0000 mg | ORAL_TABLET | Freq: Every day | ORAL | 2 refills | Status: AC
  Filled 2023-04-17: qty 30, 60d supply, fill #0

## 2023-04-17 MED ORDER — TAMSULOSIN HCL 0.4 MG PO CAPS
0.8000 mg | ORAL_CAPSULE | Freq: Every day | ORAL | 2 refills | Status: AC
  Filled 2023-04-17: qty 60, 30d supply, fill #0

## 2023-04-17 NOTE — Discharge Summary (Signed)
Physician Discharge Summary  Nathaniel Colon ZOX:[id number] DOB: May 31, 1961 DOA: 04/15/2023  PCP: Pcp, No  Admit date: 04/15/2023 Discharge date: 04/17/2023  Admitted From: Home  Discharge disposition: Home  Recommendations for Outpatient Follow-Up:   Follow up with your primary care provider in one week.  Check CBC, BMP, magnesium in the next visit Patient should be emphasized on compliance with medication.  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  Diet recommendation: Low sodium, heart healthy.  Carbohydrate-modified.    Wound care: None.  Code status: Full.   History of Present Illness:   Nathaniel Colon is a 62 y.o. male with medical history significant of chronic hypoxic respiratory failure on oxygen 2 L at baseline, GERD, diabetes type 2, peripheral vascular disease, CAD, congestive heart failure, chronic kidney disease, diabetic retinopathy, essential hypertension, hypercholesterolemia, peripheral neuropathy  major depressive disorder, BPH, and obstructive sleep apnea presented to emergency department with complaints of excessive urination, cold sweats and fall order to the urine for 2 days.  Patient had ran out of his blood pressure medications.  He also complained of throbbing headache with some dizziness.  In the ED, patient was noted to have blood pressure of 178/107 urine analysis with glucose more than 500  with ketone positive.  CMP showed mild hyponatremia with sodium of 132 creatinine elevated at 1.7.  CBC showed mild leukocytosis at 13.2.  Chest x-ray without any acute infiltrate.   CT abdomen pelvis no acute finding in the abdomen pelvis.  Left lower pole nephrolithiasis.  No hydronephrosis.  In the ED, patient received 1 dose of IV ceftriaxone.  Patient was then considered for admission in the hospital for further evaluation and treatment.     Hospital Course:   Following conditions were addressed during hospitalization as listed below,  Assessment and Plan:   Acute cystitis Presented with fever chills and urinary symptoms.  Urinalysis abnormal.  Afebrile but leukocytosis present on admission which has normalized at this time..  CT scan showed left lower pole nephrolithiasis but no hydronephrosis.  Patient received Rocephin hospitalization.  Blood cultures was negative in 2 days.  At this time patient is afebrile and feels better.  Will continue Keflex on discharge to complete the course.    Insulin-dependent DM type II with Hyperglycemia Ran out of insulin for the last 2 weeks.  Home regimen consists of NovoLog 35 units 3 times daily with meals, Lantus 70 unit daily and Novolin 18-20 unit twice daily with meals and metformin 1000 mg with breakfast.  Refills have been given to the patient.   Facial pain, jaw pain, sinus tenderness.   Possibility of sinusitis.  Influenza and COVID was negative.  Proved.  Will be on Keflex on discharge.   Uncontrolled blood pressure History of essential hypertension History of CHF No 2D echocardiogram on record.  Blood pressure elevated on presentation.  Had ran out of medications.  Patient will be given refills on discharge.  2D echocardiogram with LV ejection fraction of 55 to 60% with grade 2 diastolic dysfunction.  Advised to remain compliant with medication.  CKD stage IIIa Left lower pole nephrolithiasis creatinine 1.6 and GFR 43.  Creatinine 10  months ago was 1.8.  Could be at baseline.   Marland KitchenHistory of coronary artery disease Continue aspirin Plavix and Crestor.   Peripheral vascular disease Continue aspirin and Plavix.   Hyperlipidemia -Continue Crestor    Diabetic polyneuropathy -Continue gabapentin    Chronic hypoxic respiratory failure History of OSA On 2 L of oxygen at baseline.  Continue albuterol as needed.  Resume inhalers from home.   Cholelithiasis  CT abdomen pelvis showed incidental finding of cholelithiasis.  No evidence of acute cholecystitis.  Asymptomatic at this time.  Disposition.  At this time, patient is stable for disposition home with outpatient PCP follow-up.  Medical Consultants:   None.  Procedures:    None Subjective:   Today, patient was seen and examined at bedside.  Complains of feeling better.  No fever.  Minimal urinary symptoms.  Mild weakness.  Discharge Exam:   Vitals:   04/17/23 0802 04/17/23 0849  BP: (!) 126/93   Pulse: 70 68  Resp: 17 18  Temp: 98.5 F (36.9 C)   SpO2: 98% 99%   Vitals:   04/17/23 0200 04/17/23 0432 04/17/23 0802 04/17/23 0849  BP: (!) 144/100  (!) 126/93   Pulse: 66  70 68  Resp:   17 18  Temp: 98.4 F (36.9 C)  98.5 F (36.9 C)   TempSrc: Oral     SpO2: 99%  98% 99%  Weight:  94.9 kg    Height:       Body mass index is 26.86 kg/m.  General: Alert awake, not in obvious distress HENT: pupils equally reacting to light,  No scleral pallor or icterus noted. Oral mucosa is moist.  Chest:  Clear breath sounds.  No crackles or wheezes.  CVS: S1 &S2 heard. No murmur.  Regular rate and rhythm. Abdomen: Soft, nontender, nondistended.  Bowel sounds are heard.   Extremities: No cyanosis, clubbing or edema.  Peripheral pulses are palpable. Psych: Alert, awake and oriented, normal mood CNS:  No cranial nerve deficits.  Power equal in all extremities.   Skin: Warm and dry.  No rashes noted.  The results of significant diagnostics from this  hospitalization (including imaging, microbiology, ancillary and laboratory) are listed below for reference.     Diagnostic Studies:   ECHOCARDIOGRAM COMPLETE  Result Date: 04/16/2023    ECHOCARDIOGRAM REPORT   Patient Name:   Nathaniel Colon Date of Exam: 04/16/2023 Medical Rec #:  [id number]      Height:       74.0 in Accession #:    [id number]     Weight:       207.2 lb Date of Birth:  May 14, 1961     BSA:          2.207 m Patient Age:    61 years       BP:           144/97 mmHg Patient Gender: M              HR:           69 bpm. Exam Location:  Inpatient Procedure: 2D Echo, Cardiac Doppler and Color Doppler Indications:    CHF-Acute Diastolic I50.31  History:        Patient has no prior history of Echocardiogram  examinations.                 CHF, CAD; Risk Factors:Hypertension, Dyslipidemia, Former Smoker                 and Sleep Apnea.  Sonographer:    Aron Baba Referring Phys: [id number] [facility] [place]  Sonographer Comments: Image acquisition challenging due to respiratory motion. IMPRESSIONS  1. Left ventricular ejection fraction, by estimation, is 55 to 60%. The left ventricle has normal function. The left ventricle has no regional wall motion abnormalities. Left ventricular diastolic parameters are consistent with Grade II diastolic dysfunction (pseudonormalization).  2. Right ventricular systolic function is normal. The right ventricular size is normal. Tricuspid regurgitation signal is inadequate for assessing PA pressure.  3. The mitral valve is normal in structure. No evidence of mitral valve regurgitation.  4. The aortic valve is tricuspid. There is mild calcification of the aortic valve. Aortic valve regurgitation is not visualized.  5. There is mild dilatation of the aortic root, measuring 42 mm.  6. The inferior vena cava is normal in size with greater than 50% respiratory variability, suggesting right atrial pressure of 3 mmHg. FINDINGS  Left Ventricle: Left ventricular ejection fraction, by  estimation, is 55 to 60%. The left ventricle has normal function. The left ventricle has no regional wall motion abnormalities. The left ventricular internal cavity size was normal in size. There is  no left ventricular hypertrophy. Left ventricular diastolic parameters are consistent with Grade II diastolic dysfunction (pseudonormalization). Right Ventricle: The right ventricular size is normal. Right ventricular systolic function is normal. Tricuspid regurgitation signal is inadequate for assessing PA pressure. Left Atrium: Left atrial size was normal in size. Right Atrium: Right atrial size was normal in size. Pericardium: There is no evidence of pericardial effusion. Mitral Valve: The mitral valve is normal in structure. No evidence of mitral valve regurgitation. Tricuspid Valve: Tricuspid valve regurgitation is not demonstrated. Aortic Valve: The aortic valve is tricuspid. There is mild calcification of the aortic valve. Aortic valve regurgitation is not visualized. Pulmonic Valve: Pulmonic valve regurgitation is not visualized. Aorta: There is mild dilatation of the aortic root, measuring 42 mm. Venous: The inferior vena cava is normal in size with greater than 50% respiratory variability, suggesting right atrial pressure of 3 mmHg. IAS/Shunts: No atrial level shunt detected by color flow Doppler.  LEFT VENTRICLE PLAX 2D LVIDd:         4.60 cm   Diastology LVIDs:         3.40 cm   LV e' medial:    5.66 cm/s LV PW:         0.90 cm   LV E/e' medial:  11.7 LV IVS:        0.80 cm   LV e' lateral:   8.70 cm/s LVOT diam:     2.30 cm   LV E/e' lateral: 7.6 LV SV:         61 LV SV Index:   28 LVOT Area:     4.15 cm  RIGHT VENTRICLE RV S prime:     11.60 cm/s TAPSE (M-mode): 2.0 cm LEFT ATRIUM             Index        RIGHT ATRIUM          Index LA diam:        3.10 cm 1.40 cm/m   RA Area:     9.13 cm LA Vol (A2C):  46.4 ml 21.03 ml/m  RA Volume:   15.90 ml 7.21 ml/m LA Vol (A4C):   52.8 ml 23.93 ml/m LA Biplane  Vol: 54.8 ml 24.84 ml/m  AORTIC VALVE LVOT Vmax:   78.80 cm/s LVOT Vmean:  49.000 cm/s LVOT VTI:    0.148 m  AORTA Ao Root diam: 4.15 cm Ao Asc diam:  3.70 cm MITRAL VALVE MV Area (PHT): 3.37 cm    SHUNTS MV Decel Time: 225 msec    Systemic VTI:  0.15 m MR Peak grad: 1.0 mmHg     Systemic Diam: 2.30 cm MR Vmax:      49.50 cm/s MV E velocity: 66.10 cm/s MV A velocity: 61.60 cm/s MV E/A ratio:  1.07 Photographer signed by Carolan Clines Signature Date/Time: 04/16/2023/3:47:56 PM    Final    CT ABDOMEN PELVIS W CONTRAST  Result Date: 04/15/2023 CLINICAL DATA:  Abdominal pain, acute, nonlocalized EXAM: CT ABDOMEN AND PELVIS WITH CONTRAST TECHNIQUE: Multidetector CT imaging of the abdomen and pelvis was performed using the standard protocol following bolus administration of intravenous contrast. RADIATION DOSE REDUCTION: This exam was performed according to the departmental dose-optimization program which includes automated exposure control, adjustment of the mA and/or kV according to patient size and/or use of iterative reconstruction technique. CONTRAST:  60mL OMNIPAQUE IOHEXOL 350 MG/ML SOLN COMPARISON:  02/17/2015 FINDINGS: Lower chest: No acute abnormality. Small hiatal hernia. Coronary artery calcifications in the visualized right coronary artery. Hepatobiliary: Small layering gallstones within the gallbladder. No biliary ductal dilatation. No focal hepatic abnormality. Pancreas: No focal abnormality or ductal dilatation. Spleen: No focal abnormality.  Normal size. Adrenals/Urinary Tract: Left adrenal nodule again noted, measuring 1.7 cm, decreased in size since prior study compatible with benign adenoma. Right adrenal gland normal. 8 mm nonobstructing stone in the lower pole of the left kidney. No ureteral stones or hydronephrosis bilaterally. Urinary bladder unremarkable. Stomach/Bowel: Normal appendix. Stomach, large and small bowel grossly unremarkable. Vascular/Lymphatic: Aortic atherosclerosis.  No evidence of aneurysm or adenopathy. Reproductive: No visible focal abnormality. Other: No free fluid or free air. Musculoskeletal: No acute bony abnormality. IMPRESSION: No acute findings in the abdomen or pelvis. Left lower pole nephrolithiasis.  No hydronephrosis. Cholelithiasis.  No CT evidence of acute cholecystitis. Coronary artery disease, aortic atherosclerosis. Electronically Signed   By: Charlett Nose M.D.   On: 04/15/2023 20:25   DG Chest 2 View  Result Date: 04/15/2023 CLINICAL DATA:  Chills and weakness. EXAM: CHEST - 2 VIEW COMPARISON:  12/16/2022 FINDINGS: The heart is normal in size. The cardiomediastinal contours are normal. The lungs are clear. Pulmonary vasculature is normal. No consolidation, pleural effusion, or pneumothorax. No acute osseous abnormalities are seen. IMPRESSION: No acute chest findings. Electronically Signed   By: Narda Rutherford M.D.   On: 04/15/2023 19:06     Labs:   Basic Metabolic Panel: Recent Labs  Lab 04/15/23 1212 04/16/23 0757 04/17/23 0823  NA 132* 134* 131*  K 4.7 4.3 4.7  CL 100 98 98  CO2 22 25 25   GLUCOSE 403* 285* 323*  BUN 23* 22* 23*  CREATININE 1.78* 1.49* 1.63*  CALCIUM 8.7* 9.0 9.0  MG  --   --  1.9   GFR Estimated Creatinine Clearance: 56 mL/min (A) (by C-G formula based on SCr of 1.63 mg/dL (H)). Liver Function Tests: Recent Labs  Lab 04/15/23 1212 04/16/23 0757 04/17/23 0823  AST 19 14* 17  ALT 20 17 18   ALKPHOS 107 102 103  BILITOT 1.2 0.9 0.7  PROT 7.2 7.5 7.1  ALBUMIN 3.5 3.5 3.4*   Recent Labs  Lab 04/15/23 1212  LIPASE 22   No results for input(s): "AMMONIA" in the last 168 hours. Coagulation profile No results for input(s): "INR", "PROTIME" in the last 168 hours.  CBC: Recent Labs  Lab 04/15/23 1212 04/16/23 0757 04/17/23 0823  WBC 13.2* 7.6 10.2  HGB 14.8 15.1 14.9  HCT 46.6 47.7 47.3  MCV 87.6 86.9 86.0  PLT 247 254 258   Cardiac Enzymes: No results for input(s): "CKTOTAL", "CKMB",  "CKMBINDEX", "TROPONINI" in the last 168 hours. BNP: Invalid input(s): "POCBNP" CBG: Recent Labs  Lab 04/16/23 1217 04/16/23 1551 04/16/23 2109 04/17/23 0210 04/17/23 0800  GLUCAP 340* 352* 344* 280* 278*   D-Dimer No results for input(s): "DDIMER" in the last 72 hours. Hgb A1c Recent Labs    04/15/23 2210  HGBA1C 10.4*   Lipid Profile No results for input(s): "CHOL", "HDL", "LDLCALC", "TRIG", "CHOLHDL", "LDLDIRECT" in the last 72 hours. Thyroid function studies No results for input(s): "TSH", "T4TOTAL", "T3FREE", "THYROIDAB" in the last 72 hours.  Invalid input(s): "FREET3" Anemia work up No results for input(s): "VITAMINB12", "FOLATE", "FERRITIN", "TIBC", "IRON", "RETICCTPCT" in the last 72 hours. Microbiology Recent Results (from the past 240 hour(s))  Urine Culture     Status: Abnormal (Preliminary result)   Collection Time: 04/15/23 12:07 PM   Specimen: Urine, Clean Catch  Result Value Ref Range Status   Specimen Description URINE, CLEAN CATCH  Final   Special Requests   Final    NONE Performed at [facility], [address], [place], [place] [address]    Culture >=100,000 COLONIES/mL STAPHYLOCOCCUS LUGDUNENSIS (A)  Final   Report Status PENDING  Incomplete  Resp panel by RT-PCR (RSV, Flu A&B, Covid) Anterior Nasal Swab     Status: None   Collection Time: 04/15/23  4:48 PM   Specimen: Anterior Nasal Swab  Result Value Ref Range Status   SARS Coronavirus 2 by RT PCR NEGATIVE NEGATIVE Final   Influenza A by PCR NEGATIVE NEGATIVE Final   Influenza B by PCR NEGATIVE NEGATIVE Final    Comment: (NOTE) The Xpert Xpress SARS-CoV-2/FLU/RSV plus assay is intended as an aid in the diagnosis of influenza from Nasopharyngeal swab specimens and should not be used as a sole basis for treatment. Nasal washings and aspirates are unacceptable for Xpert Xpress SARS-CoV-2/FLU/RSV testing.  Fact Sheet for Patients: BloggerCourse.com  Fact  Sheet for Healthcare Providers: SeriousBroker.it  This test is not yet approved or cleared by the [place] FDA and has been authorized for detection and/or diagnosis of SARS-CoV-2 by FDA under an Emergency Use Authorization (EUA). This EUA will remain in effect (meaning this test can be used) for the duration of the COVID-19 declaration under Section 564(b)(1) of the Act, 21 U.S.C. section 360bbb-3(b)(1), unless the authorization is terminated or revoked.     Resp Syncytial Virus by PCR NEGATIVE NEGATIVE Final    Comment: (NOTE) Fact Sheet for Patients: BloggerCourse.com  Fact Sheet for Healthcare Providers: SeriousBroker.it  This test is not yet approved or cleared by the [place] FDA and has been authorized for detection and/or diagnosis of SARS-CoV-2 by FDA under an Emergency Use Authorization (EUA). This EUA will remain in effect (meaning this test can be used) for the duration of the COVID-19 declaration under Section 564(b)(1) of the Act, 21 U.S.C. section 360bbb-3(b)(1), unless the authorization is terminated or revoked.  Performed at [facility], [address], [place], [place] [address]   Culture, blood (Routine X 2) w Reflex to ID Panel     Status: None (Preliminary result)   Collection Time: 04/15/23 10:10 PM   Specimen: BLOOD LEFT HAND  Result Value Ref Range Status   Specimen Description BLOOD LEFT HAND  Final   Special Requests   Final    BOTTLES DRAWN AEROBIC AND ANAEROBIC Blood Culture adequate volume   Culture   Final    NO GROWTH 2 DAYS Performed at [facility], [address], [place], [place] [address]    Report Status PENDING  Incomplete  Culture, blood (Routine X 2) w Reflex to ID Panel     Status: None (Preliminary result)   Collection Time: 04/15/23 10:10 PM   Specimen: BLOOD RIGHT HAND  Result Value Ref Range Status   Specimen Description BLOOD  RIGHT HAND  Final   Special Requests   Final    BOTTLES DRAWN AEROBIC AND ANAEROBIC Blood Culture adequate volume   Culture   Final    NO GROWTH 2 DAYS Performed at [facility], [address] [place], [place] [address]    Report Status PENDING  Incomplete     Discharge Instructions:   Discharge Instructions     Call MD for:  persistant nausea and vomiting   Complete by: As directed    Call MD for:  temperature >100.4   Complete by: As directed    Diet - low sodium heart healthy   Complete by: As directed    Diet Carb Modified   Complete by: As directed    Discharge instructions   Complete by: As directed    Follow up with your primary care physician in one week. Check blood work in one week. Seek medical attention for worsening symptoms. Complete the course of antibiotics.   Increase activity slowly   Complete by: As directed       Allergies as of 04/17/2023   No Known Allergies      Medication List     STOP taking these medications    acetaminophen-codeine 300-30 MG tablet Commonly known as: TYLENOL #3   capsaicin 0.025 % cream Commonly known as: ZOSTRIX   clopidogrel 75 MG tablet Commonly known as: Plavix   famotidine 40 MG tablet Commonly known as: PEPCID   metFORMIN 1000 MG tablet Commonly known as: GLUCOPHAGE   sitaGLIPtin 50 MG tablet Commonly known as: JANUVIA       TAKE these medications    acetaminophen 500 MG tablet Commonly known as: TYLENOL Take 1,000 mg by mouth as needed for moderate pain.   albuterol 108 (90 Base) MCG/ACT inhaler Commonly known as: VENTOLIN HFA Inhale 2 puffs into the lungs every 6 (six) hours as needed for wheezing or shortness of breath.   aluminum-magnesium hydroxide 200-200 MG/5ML suspension Take 10 mLs by mouth as needed for indigestion.   amLODipine 5 MG tablet Commonly known as: NORVASC Take 1 tablet (5 mg total) by mouth daily.   aspirin EC 81 MG tablet Take 1 tablet (81 mg total) by mouth  daily.   carboxymethylcellulose 0.5 % Soln Commonly known as: REFRESH PLUS Place 1-2 drops into both eyes as needed (irritation).   cephALEXin 500 MG capsule Commonly known as: KEFLEX Take 1 capsule (500 mg total) by mouth 4 (four) times daily for 5 days.   cholecalciferol 25 MCG (1000 UNIT) tablet Commonly known as: VITAMIN D3 Take 1,000 Units by mouth daily.   DULoxetine  60 MG capsule Commonly known as: CYMBALTA Take 60 mg by mouth daily.   esomeprazole 40 MG capsule Commonly known as: NEXIUM Take 40 mg by mouth 2 (two) times daily before a meal.   FeroSul 325 (65 Fe) MG tablet Generic drug: ferrous sulfate Take 1 tablet (325 mg total) by mouth 3 (three) times a week. Take on Monday, Wednesday, and friday Start taking on: April 18, 2023   finasteride 5 MG tablet Commonly known as: PROSCAR Take 1 tablet (5 mg total) by mouth daily.   gabapentin 400 MG capsule Commonly known as: NEURONTIN Take 2 capsules (800 mg total) by mouth 2 (two) times daily. What changed: Another medication with the same name was removed. Continue taking this medication, and follow the directions you see here.   Glucagon Emergency 1 MG Kit Inject 1 mg into the vein as needed (low blood sugar).   glucose 4 GM chewable tablet Chew 2 tablets by mouth as needed for low blood sugar.   Insulin Aspart FlexPen 100 UNIT/ML Commonly known as: NOVOLOG Inject 4-8 Units into the skin in the morning, at noon, and at bedtime.   insulin glargine-yfgn 100 UNIT/ML injection Commonly known as: SEMGLEE Inject 40 Units into the skin 2 (two) times daily.   lidocaine 5 % Commonly known as: LIDODERM Place 1 patch onto the skin daily. Apply to back   Melatonin 3 MG Caps Take 6 mg by mouth at bedtime.   metoCLOPramide 10 MG tablet Commonly known as: REGLAN Take 5 mg by mouth in the morning and at bedtime.   metoprolol succinate 50 MG 24 hr tablet Commonly known as: TOPROL-XL Take 0.5 tablets (25 mg  total) by mouth daily.   nitroGLYCERIN 0.4 MG SL tablet Commonly known as: NITROSTAT Place 0.4 mg under the tongue every 5 (five) minutes as needed for chest pain.   rosuvastatin 40 MG tablet Commonly known as: CRESTOR Take 20 mg by mouth daily.   senna-docusate 8.6-50 MG tablet Commonly known as: Senokot-S Take 2 tablets by mouth at bedtime.   tamsulosin 0.4 MG Caps capsule Commonly known as: FLOMAX Take 2 capsules (0.8 mg total) by mouth at bedtime.   Trelegy Ellipta 100-62.5-25 MCG/ACT Aepb Generic drug: Fluticasone-Umeclidin-Vilant Inhale 1 puff into the lungs in the morning.   vitamin B-12 500 MCG tablet Commonly known as: CYANOCOBALAMIN Take 1,000 mcg by mouth daily.   Voltaren 1 % Gel Generic drug: diclofenac Sodium Apply 1 Application topically as needed (back pain).          Time coordinating discharge: 39 minutes  Signed:  Shaela Boer  [facility] 04/17/2023, 11:05 AM

## 2023-04-17 NOTE — Progress Notes (Signed)
Pt discharged home with his brother in stable condition. Home meds provided by [facility] before discharge. Discharge instructions given with no concerns voiced

## 2023-04-17 NOTE — Progress Notes (Signed)
Pt blood sugar has been elevated in the 300s and pt states its because he's not taking his insulin as its been prescribed. Pt wants his medication adjusted because the sliding scale we're providing isn't sufficient. Please follow-up with pt.

## 2023-04-18 LAB — URINE CULTURE: Culture: >=100000 — AB

## 2023-04-20 LAB — CULTURE, BLOOD (ROUTINE X 2)
Culture: NO GROWTH
Culture: NO GROWTH
Special Requests: ADEQUATE
Special Requests: ADEQUATE

## 2023-07-21 ENCOUNTER — Other Ambulatory Visit: Payer: Self-pay | Admitting: Otolaryngology

## 2023-07-21 ENCOUNTER — Encounter (HOSPITAL_BASED_OUTPATIENT_CLINIC_OR_DEPARTMENT_OTHER): Payer: Self-pay | Admitting: Otolaryngology

## 2023-07-22 NOTE — Progress Notes (Addendum)
Patient wears 2L O2 at baseline. Per guidelines not a [facility] candidate. Carla at Dr. Jenne Pane office aware

## 2023-07-25 ENCOUNTER — Encounter (HOSPITAL_COMMUNITY): Payer: Self-pay | Admitting: Otolaryngology

## 2023-07-25 ENCOUNTER — Other Ambulatory Visit: Payer: Self-pay

## 2023-07-25 NOTE — Progress Notes (Signed)
PCP -Margie Ege  Cardiologist - Department of [facility] ([place])   PPM/ICD - denies Device Orders - n/a Rep Notified - n/a  Chest x-ray - 11-13-22 EKG - 04-16-23 Stress Test - denies ECHO - 04-16-23 Cardiac Cath - 05-01-16  CPAP - 02 2 L    Fasting Blood Sugar - per patient "low to high" A1c 10.4 04-15-23 Checks Blood Sugar Libra lll Freestyle to left arm as of today 07-25-23  Blood Thinner Instructions: denies Aspirin Instructions: aspirin EC 81  ask patient to reach out to surgeon office for instructions  ERAS Protcol - clear liquids until 1245  COVID TEST- na  Anesthesia review: yes Hx HTN, DM, CAD with 2 stents, CHF PVD and wears o2 2l at all times  Patient verbally denies any shortness of breath, fever, cough and chest pain during phone call   -------------  SDW INSTRUCTIONS given:  Your procedure is scheduled on July 29, 2023.  Report to [facility] Main Entrance "A" at 1:15  P.M., and check in at the Admitting office.  Call this number if you have problems the morning of surgery:  [phone]   Remember:  Do not eat after midnight the night before your surgery  You may drink clear liquids until 12:45 NOON the day of your surgery.   Clear liquids allowed are: Water, Non-Citrus Juices (without pulp), Carbonated Beverages, Clear Tea, Black Coffee Only, and Gatorade    Take these medicines the morning of surgery with A SIP OF WATER  DULoxetine (CYMBALTA)  esomeprazole (NEXIUM)  finasteride (PROSCAR)  (TRELEGY ELLIPTA)  gabapentin (NEURONTIN)  metoCLOPramide (REGLAN)  metoprolol succinate (TOPROL-XL)  rosuvastatin (CRESTOR)    IF NEEDED acetaminophen (TYLENOL)  albuterol (VENTOLIN HFA)  aluminum-magnesium hydroxide  (REFRESH PLUS)  glucose 4 GM  nitroGLYCERIN (NITROSTAT)    As of today, STOP taking any Aspirin (unless otherwise instructed by your surgeon) Aleve, Naproxen, Ibuprofen, Motrin, Advil, Goody's, BC's, all herbal medications, fish oil, and  all vitamins. THIS INCLUDES YOUR diclofenac Sodium (VOLTAREN) and lidocaine (LIDODERM). WHAT DO I DO ABOUT MY DIABETES MEDICATION?   Do not take oral diabetes medicines JANUVIAthe morning of surgery.  THE NIGHT BEFORE SURGERY, take 0 units of Insulin Aspart FlexPen (NOVOLOG) insulisitaGLIPtin .  THE NIGHT BEFORE SURGERY take 20 units of insulin glargine-yfgn (SEMGLEE)     THE MORNING OF SURGERY, take 20 units of insulin glargine-yfgn (SEMGLEE)    THE MORNING OF SURGERY take 0 units Insulin   Aspart FlexPen (NOVOLOG )        if your CBG is greater than 220 mg/dL, you may take        1/2   of your sliding scale (correction) dose of insulin.  The day of surgery, do not take other diabetes injectables, including Byetta (exenatide), Bydureon (exenatide ER), Victoza (liraglutide), or Trulicity (dulaglutide).  I   HOW TO MANAGE YOUR DIABETES BEFORE AND AFTER SURGERY  Why is it important to control my blood sugar before and after surgery? Improving blood sugar levels before and after surgery helps healing and can limit problems. A way of improving blood sugar control is eating a healthy diet by:  Eating less sugar and carbohydrates  Increasing activity/exercise  Talking with your doctor about reaching your blood sugar goals High blood sugars (greater than 180 mg/dL) can raise your risk of infections and slow your recovery, so you will need to focus on controlling your diabetes during the weeks before surgery. Make sure that the doctor who takes care of  your diabetes knows about your planned surgery including the date and location.  How do I manage my blood sugar before surgery? Check your blood sugar at least 4 times a day, starting 2 days before surgery, to make sure that the level is not too high or low.  Check your blood sugar the morning of your surgery when you wake up and every 2 hours until you get to the Short Stay unit.  If your blood sugar is less than 70 mg/dL, you will need  to treat for low blood sugar: Do not take insulin. Treat a low blood sugar (less than 70 mg/dL) with  cup of clear juice (cranberry or apple), 4 glucose tablets, OR glucose gel. Recheck blood sugar in 15 minutes after treatment (to make sure it is greater than 70 mg/dL). If your blood sugar is not greater than 70 mg/dL on recheck, call [phone] for further instructions. Report your blood sugar to the short stay nurse when you get to Short Stay.  If you are admitted to the hospital after surgery: Your blood sugar will be checked by the staff and you will probably be given insulin after surgery (instead of oral diabetes medicines) to make sure you have good blood sugar levels. The goal for blood sugar control after surgery is 80-180 mg/dL.                      Do not wear jewelry, make up, or nail polish            Do not wear lotions, powders, perfumes/colognes, or deodorant.            Do not shave 48 hours prior to surgery.  Men may shave face and neck.            Do not bring valuables to the hospital.            [facility] is not responsible for any belongings or valuables.  Do NOT Smoke (Tobacco/Vaping) 24 hours prior to your procedure If you use a CPAP at night, you may bring all equipment for your overnight stay.   Contacts, glasses, dentures or bridgework may not be worn into surgery.      For patients admitted to the hospital, discharge time will be determined by your treatment team.   Patients discharged the day of surgery will not be allowed to drive home, and someone needs to stay with them for 24 hours.    Special instructions:   [facility]- Preparing For Surgery  Before surgery, you can play an important role. Because skin is not sterile, your skin needs to be as free of germs as possible. You can reduce the number of germs on your skin by washing with CHG (chlorahexidine gluconate) Soap before surgery.  CHG is an antiseptic cleaner which kills germs and bonds with  the skin to continue killing germs even after washing.    Oral Hygiene is also important to reduce your risk of infection.  Remember - BRUSH YOUR TEETH THE MORNING OF SURGERY WITH YOUR REGULAR TOOTHPASTE  Please do not use if you have an allergy to CHG or antibacterial soaps. If your skin becomes reddened/irritated stop using the CHG.  Do not shave (including legs and underarms) for at least 48 hours prior to first CHG shower. It is OK to shave your face.  Please follow these instructions carefully.   Shower the NIGHT BEFORE SURGERY and the MORNING OF SURGERY with DIAL Soap.  Pat yourself dry with a CLEAN TOWEL.  Wear CLEAN PAJAMAS to bed the night before surgery  Place CLEAN SHEETS on your bed the night of your first shower and DO NOT SLEEP WITH PETS.   Day of Surgery: Please shower morning of surgery  Wear Clean/Comfortable clothing the morning of surgery Do not apply any deodorants/lotions.   Remember to brush your teeth WITH YOUR REGULAR TOOTHPASTE.   Questions were answered. Patient verbalized understanding of instructions.

## 2023-07-28 ENCOUNTER — Other Ambulatory Visit (HOSPITAL_COMMUNITY): Payer: Self-pay

## 2023-07-28 NOTE — Progress Notes (Signed)
Anesthesia Chart Review: Nathaniel Colon  Case: [id number] Date/Time: 07/29/23 1515   Procedure: DRUG INDUCED SLEEP ENDOSCOPY   Anesthesia type: General   Pre-op diagnosis: Obstructive sleep apnea   [facility] ROOM 04 / [facility] OR   Surgeons: Christia Reading, MD       DISCUSSION: Patient is a 62 year old male scheduled for the above procedure. Case was moved for [facility] to [facility] Main OR due to history and continuous home O2.   History includes former smoker (quit 08/12/16), CAD (DES pRCA & mLAD for angina 05/01/16), HTN, DM2 (with peripheral neuropathy), PVD, OSA (intolerant to CPAP), dyspnea, COPD, home O2 (2L/[place]), CKD, hiatal hernia, GERD, spinal surgery (C5-7 ACDF 08/30/11).   -  admission 04/15/23-04/17/23 for acute cystitis. He received Rocephin, then transitioned to Keflex.  Urine culture positive for Staphylococcus Lugdunensis. Blood cultures negative. A1c 10.4% in setting of running out of insulin 2 weeks prior. Insulin resumed and refilled at discharge. He had also ran out of anti-hypertensives which were also resumed and refilled. Echo done on 04/16/23 and showed LVEF 55 to 60%, no regional wall motion abnormalities, grade 2 diastolic dysfunction, normal RV systolic function, mild AV calcification, aortic root 42 mm.    - [facility] admission 11/13/22-11/25/22 for left pneumonia. Patient subjectively and objectively improved on azithromycin and ceftriaxone.   - Nuclear stress test was done at the [facility] on 12/05/22. Appears this was arranged after pulmonologist Dr. Judeth Horn recommended referral back to cardiology given DOE for at least the past year but noticed symptoms as far back as 2013 following ACDF. He suspected DOE multi-factorial including chronic bronchitis, possibly chemical pneumonitis/bronchitis from GERD/possible aspiration events. He also described some LE weakness, so neurology evaluation recommended. [facility] APP contacted the [facility] cardiology team who ordered the  stress test. Stress test results requested from [facility], as I was unable to view results in Care Everywhere. Per 12/23/22 Progress Note by Dr. Judeth Horn, "Reviewed recent cardiology evaluation which showed no ischemia on perfusion scan, normal EF on TTE despite decreased EF on perfusion scan. No further workup recommended from their standpoint." Six month pulmonology follow-up recommended.   As above, A1c 10.4% on 04/15/23--in setting of running out of insulin 2 weeks prior. He wears a [employer] continuous glucose monitor. Current DM medication per list include Januvia 50 mg daily, Semglee 40 units BID, Novolog 100 unit/ml 35-50 units TID.   If additional pertinent [facility] records received prior to surgery then I will plan to update my note, otherwise anesthesia team to evaluate on the day of procedure.   VS:  Wt Readings from Last 3 Encounters:  04/17/23 94.9 kg  12/23/22 96.3 kg  12/16/22 95.7 kg   BP Readings from Last 3 Encounters:  04/17/23 (!) 126/93  12/23/22 120/80  12/16/22 110/70   Pulse Readings from Last 3 Encounters:  04/17/23 68  12/23/22 85  12/16/22 87     PROVIDERS: Administration, [employer], Matthew, MD is pulmonologist   LABS: For day of surgery as indicated. Last results in [facility] include: Lab Results  Component Value Date   WBC 10.2 04/17/2023   HGB 14.9 04/17/2023   HCT 47.3 04/17/2023   PLT 258 04/17/2023   GLUCOSE 323 (H) 04/17/2023   ALT 18 04/17/2023   AST 17 04/17/2023   NA 131 (L) 04/17/2023   K 4.7 04/17/2023   CL 98 04/17/2023   CREATININE 1.63 (H) 04/17/2023   BUN 23 (H) 04/17/2023   CO2 25 04/17/2023  HGBA1C 10.4 (H) 04/15/2023    Unsuccessful attempt at PFTs on 09/19/22.    IMAGES: CT Abd/pelvis 04/15/23: IMPRESSION: - No acute findings in the abdomen or pelvis. - Left lower pole nephrolithiasis.  No hydronephrosis. - Cholelithiasis.  No CT evidence of acute cholecystitis. - Coronary artery disease, aortic  atherosclerosis.   Gastric emptying study 09/06/22 [facility] CE): Impression: Abnormal nuclear medicine gastric emptying examination with  elevated degree of retained gastric contents throughout majority  of this exam.    EKG: 04/16/23: NSR   CV: Echo 04/16/23: IMPRESSIONS   1. Left ventricular ejection fraction, by estimation, is 55 to 60%. The  left ventricle has normal function. The left ventricle has no regional  wall motion abnormalities. Left ventricular diastolic parameters are  consistent with Grade II diastolic  dysfunction (pseudonormalization).   2. Right ventricular systolic function is normal. The right ventricular  size is normal. Tricuspid regurgitation signal is inadequate for assessing  PA pressure.   3. The mitral valve is normal in structure. No evidence of mitral valve  regurgitation.   4. The aortic valve is tricuspid. There is mild calcification of the  aortic valve. Aortic valve regurgitation is not visualized.   5. There is mild dilatation of the aortic root, measuring 42 mm.   6. The inferior vena cava is normal in size with greater than 50%  respiratory variability, suggesting right atrial pressure of 3 mmHg.    Nuclear stress test 12/05/22 [facility] CE): Cannot view results in Care Everywhere. Instead, notation reads "Please see cardiology consult in CPRS for report." Report requested from the [facility].  Per 12/23/22 Progress Note by Dr. Judeth Horn, "Reviewed recent cardiology evaluation which showed no ischemia on perfusion scan, normal EF on TTE despite decreased EF on perfusion scan. No further workup recommended from their standpoint."    Past Medical History:  Diagnosis Date   Anginal pain (HCC)    Arthritis    Chronic kidney disease    Complication of anesthesia    patient reports "hard to wake up after having EGD   COPD (chronic obstructive pulmonary disease) (HCC)    Coronary artery disease    Diabetes mellitus    Dyspnea    GERD (gastroesophageal reflux  disease)    H/O hiatal hernia    Hypertension    Neuromuscular disorder (HCC)    MUSCLE SPASMS   Peripheral vascular disease (HCC)    PERIPHERAL NEUROPATHY    Sleep apnea     Past Surgical History:  Procedure Laterality Date   ANTERIOR CERVICAL DECOMP/DISCECTOMY FUSION  08/30/2011   Procedure: ANTERIOR CERVICAL DECOMPRESSION/DISCECTOMY FUSION 2 LEVELS;  Surgeon: Eldred Manges, MD;  Location: [facility] OR;  Service: Orthopedics;  Laterality: N/A;  C5-6, C6-7 Anterior Cervical Discectomy and Fusion, Allograft, Plate   CARDIAC CATHETERIZATION Left 05/01/2016   Procedure: Left Heart Cath and Coronary Angiography;  Surgeon: Lamar Blinks, MD;  Location: [facility] INVASIVE CV LAB;  Service: Cardiovascular;  Laterality: Left;   CARDIAC CATHETERIZATION N/A 05/01/2016   Procedure: Coronary Stent Intervention;  Surgeon: Alwyn Pea, MD;  Location: [facility] INVASIVE CV LAB;  Service: Cardiovascular;  Laterality: N/A;   LATERAL EPICONDYLE RELEASE  08/30/2011   Procedure: TENNIS ELBOW RELEASE;  Surgeon: Eldred Manges, MD;  Location: [facility] OR;  Service: Orthopedics;  Laterality: Right;  Right Lateral Epicondyle Release, Drilling Repair   NO PAST SURGERIES      MEDICATIONS: No current facility-administered medications for this encounter.    albuterol (VENTOLIN HFA) 108 (90  Base) MCG/ACT inhaler   aluminum hydroxide-magnesium carbonate (ACID GONE) 95-358 MG/15ML SUSP   amLODipine (NORVASC) 5 MG tablet   aspirin EC 81 MG EC tablet   capsaicin (ZOSTRIX) 0.025 % cream   carboxymethylcellulose (REFRESH PLUS) 0.5 % SOLN   cholecalciferol (VITAMIN D3) 25 MCG (1000 UNIT) tablet   diclofenac Sodium (VOLTAREN) 1 % GEL   DULoxetine (CYMBALTA) 60 MG capsule   esomeprazole (NEXIUM) 40 MG capsule   famotidine (PEPCID) 40 MG tablet   ferrous sulfate 325 (65 FE) MG tablet   finasteride (PROSCAR) 5 MG tablet   Fluticasone-Umeclidin-Vilant (TRELEGY ELLIPTA) 100-62.5-25 MCG/ACT AEPB   gabapentin (NEURONTIN) 400 MG capsule    Glucagon, rDNA, (GLUCAGON EMERGENCY) 1 MG KIT   glucose 4 GM chewable tablet   Insulin Aspart FlexPen (NOVOLOG) 100 UNIT/ML   insulin glargine-yfgn (SEMGLEE) 100 UNIT/ML injection   lidocaine (LIDODERM) 5 %   Melatonin 3 MG CAPS   metoCLOPramide (REGLAN) 10 MG tablet   nitroGLYCERIN (NITROSTAT) 0.4 MG SL tablet   rosuvastatin (CRESTOR) 40 MG tablet   senna-docusate (SENOKOT-S) 8.6-50 MG tablet   sitaGLIPtin (JANUVIA) 50 MG tablet   sucralfate (CARAFATE) 1 GM/10ML suspension   tamsulosin (FLOMAX) 0.4 MG CAPS capsule   vitamin B-12 (CYANOCOBALAMIN) 500 MCG tablet   metoprolol succinate (TOPROL-XL) 50 MG 24 hr tablet    Shonna Chock, PA-C Surgical Short Stay/Anesthesiology [facility] Phone [phone] [facility] Phone [phone] 07/28/2023 12:59 PM

## 2023-07-28 NOTE — Anesthesia Preprocedure Evaluation (Addendum)
Anesthesia Evaluation  Patient identified by MRN, date of birth, ID band Patient awake    Reviewed: Allergy & Precautions, NPO status , Patient's Chart, lab work & pertinent test results, reviewed documented beta blocker date and time   History of Anesthesia Complications (+) PROLONGED EMERGENCE and history of anesthetic complications (slow to wake up from EGD and recommended he be checked for OSA)  Airway Mallampati: III  TM Distance: >3 FB Neck ROM: Full    Dental  (+) Teeth Intact, Dental Advisory Given   Pulmonary sleep apnea , COPD,  COPD inhaler and oxygen dependent, former smoker Continuous O2 2LPM [place] at home   Pulmonary exam normal breath sounds clear to auscultation       Cardiovascular hypertension (166/88 preop), Pt. on medications and Pt. on home beta blockers + CAD, + Cardiac Stents (DES to RCA and LAD 2017), + Peripheral Vascular Disease and +CHF (normal LVEF, grade 2 diastolic dysfunction)  Normal cardiovascular exam Rhythm:Regular Rate:Normal  Echo 04/2023  1. Left ventricular ejection fraction, by estimation, is 55 to 60%. The  left ventricle has normal function. The left ventricle has no regional  wall motion abnormalities. Left ventricular diastolic parameters are  consistent with Grade II diastolic  dysfunction (pseudonormalization).   2. Right ventricular systolic function is normal. The right ventricular  size is normal. Tricuspid regurgitation signal is inadequate for assessing  PA pressure.   3. The mitral valve is normal in structure. No evidence of mitral valve  regurgitation.   4. The aortic valve is tricuspid. There is mild calcification of the  aortic valve. Aortic valve regurgitation is not visualized.   5. There is mild dilatation of the aortic root, measuring 42 mm.   6. The inferior vena cava is normal in size with greater than 50%  respiratory variability, suggesting right atrial pressure of 3  mmHg.   Cath 2017  A STENT XIENCE ALPINE RX 3.0X18 drug eluting stent was successfully placed, and does not overlap previously placed stent.  Prox RCA to Mid RCA lesion, 99 %stenosed.  Post intervention, there is a 0% residual stenosis.  A STENT XIENCE ALPINE RX 3.0X15 drug eluting stent was successfully placed, and does not overlap previously placed stent.  Mid LAD lesion, 75 %stenosed.  Post intervention, there is a 0% residual stenosis.    Conclusion Successful PCI and stent with DES to proximal RCA and mid LAD     Nuclear stress test 12/05/22 [facility]): Cannot view results in Care Everywhere. Instead, notation reads "Please see cardiology consult in CPRS for report." Report requested from the [facility].  Per 12/23/22 Progress Note by Dr. Judeth Horn, "Reviewed recent cardiology evaluation which showed no ischemia on perfusion scan, normal EF on TTE despite decreased EF on perfusion scan. No further workup recommended from their standpoint."      Neuro/Psych  negative psych ROS   GI/Hepatic Neg liver ROS, hiatal hernia,GERD  Controlled,,  Endo/Other  diabetes, Poorly Controlled, Type 2, Oral Hypoglycemic Agents, Insulin Dependent  A1c 10.4  Renal/GU negative Renal ROS  negative genitourinary   Musculoskeletal  (+) Arthritis , Osteoarthritis,    Abdominal  (+) + obese  Peds  Hematology negative hematology ROS (+)   Anesthesia Other Findings   Reproductive/Obstetrics negative OB ROS                             Anesthesia Physical Anesthesia Plan  ASA: 3  Anesthesia Plan: MAC  Post-op Pain Management:    Induction:   PONV Risk Score and Plan: 1 and Treatment may vary due to age or medical condition, Propofol infusion, TIVA and Midazolam  Airway Management Planned: Natural Airway and Nasal Cannula  Additional Equipment: None  Intra-op Plan:   Post-operative Plan:   Informed Consent: I have reviewed the patients History and  Physical, chart, labs and discussed the procedure including the risks, benefits and alternatives for the proposed anesthesia with the patient or authorized representative who has indicated his/her understanding and acceptance.       Plan Discussed with: CRNA  Anesthesia Plan Comments: ( )       Anesthesia Quick Evaluation

## 2023-07-29 ENCOUNTER — Ambulatory Visit (HOSPITAL_BASED_OUTPATIENT_CLINIC_OR_DEPARTMENT_OTHER): Payer: No Typology Code available for payment source | Admitting: Vascular Surgery

## 2023-07-29 ENCOUNTER — Ambulatory Visit (HOSPITAL_COMMUNITY)
Admission: RE | Admit: 2023-07-29 | Discharge: 2023-07-29 | Disposition: A | Discharge status: Home / Self Care | Payer: No Typology Code available for payment source | Source: Ambulatory Visit | Attending: Otolaryngology | Admitting: Otolaryngology

## 2023-07-29 ENCOUNTER — Other Ambulatory Visit: Payer: Self-pay

## 2023-07-29 ENCOUNTER — Ambulatory Visit (HOSPITAL_COMMUNITY): Payer: No Typology Code available for payment source | Admitting: Vascular Surgery

## 2023-07-29 ENCOUNTER — Encounter (HOSPITAL_COMMUNITY): Payer: Self-pay | Admitting: Otolaryngology

## 2023-07-29 ENCOUNTER — Encounter (HOSPITAL_COMMUNITY)
Admission: RE | Disposition: A | Discharge status: Home / Self Care | Payer: Self-pay | Source: Ambulatory Visit | Attending: Otolaryngology

## 2023-07-29 DIAGNOSIS — M1990 Unspecified osteoarthritis, unspecified site: Secondary | ICD-10-CM | POA: Diagnosis not present

## 2023-07-29 DIAGNOSIS — Z9981 Dependence on supplemental oxygen: Secondary | ICD-10-CM | POA: Insufficient documentation

## 2023-07-29 DIAGNOSIS — Z794 Long term (current) use of insulin: Secondary | ICD-10-CM | POA: Diagnosis not present

## 2023-07-29 DIAGNOSIS — Z79899 Other long term (current) drug therapy: Secondary | ICD-10-CM | POA: Diagnosis not present

## 2023-07-29 DIAGNOSIS — Z955 Presence of coronary angioplasty implant and graft: Secondary | ICD-10-CM | POA: Diagnosis not present

## 2023-07-29 DIAGNOSIS — Z7984 Long term (current) use of oral hypoglycemic drugs: Secondary | ICD-10-CM | POA: Insufficient documentation

## 2023-07-29 DIAGNOSIS — I739 Peripheral vascular disease, unspecified: Secondary | ICD-10-CM | POA: Insufficient documentation

## 2023-07-29 DIAGNOSIS — K219 Gastro-esophageal reflux disease without esophagitis: Secondary | ICD-10-CM | POA: Insufficient documentation

## 2023-07-29 DIAGNOSIS — J449 Chronic obstructive pulmonary disease, unspecified: Secondary | ICD-10-CM | POA: Insufficient documentation

## 2023-07-29 DIAGNOSIS — I110 Hypertensive heart disease with heart failure: Secondary | ICD-10-CM | POA: Diagnosis not present

## 2023-07-29 DIAGNOSIS — G4733 Obstructive sleep apnea (adult) (pediatric): Secondary | ICD-10-CM | POA: Insufficient documentation

## 2023-07-29 DIAGNOSIS — I509 Heart failure, unspecified: Secondary | ICD-10-CM | POA: Insufficient documentation

## 2023-07-29 DIAGNOSIS — K449 Diaphragmatic hernia without obstruction or gangrene: Secondary | ICD-10-CM | POA: Insufficient documentation

## 2023-07-29 DIAGNOSIS — E119 Type 2 diabetes mellitus without complications: Secondary | ICD-10-CM | POA: Diagnosis not present

## 2023-07-29 HISTORY — DX: Dyspnea, unspecified: R06.00

## 2023-07-29 HISTORY — DX: Chronic kidney disease, unspecified: N18.9

## 2023-07-29 HISTORY — DX: Atherosclerotic heart disease of native coronary artery without angina pectoris: I25.10

## 2023-07-29 HISTORY — DX: Essential (primary) hypertension: I10

## 2023-07-29 HISTORY — DX: Chronic obstructive pulmonary disease, unspecified: J44.9

## 2023-07-29 HISTORY — DX: Unspecified osteoarthritis, unspecified site: M19.90

## 2023-07-29 HISTORY — DX: Sleep apnea, unspecified: G47.30

## 2023-07-29 HISTORY — PX: DRUG INDUCED ENDOSCOPY: SHX6808

## 2023-07-29 HISTORY — DX: Other complications of anesthesia, initial encounter: T88.59XA

## 2023-07-29 LAB — CBC
HCT: 50.7 % (ref 39.0–52.0)
Hemoglobin: 16.3 g/dL (ref 13.0–17.0)
MCH: 28.8 pg (ref 26.0–34.0)
MCHC: 32.1 g/dL (ref 30.0–36.0)
MCV: 89.6 fL (ref 80.0–100.0)
Platelets: 241 10*3/uL (ref 150–400)
RBC: 5.66 MIL/uL (ref 4.22–5.81)
RDW: 14.3 % (ref 11.5–15.5)
WBC: 8.2 10*3/uL (ref 4.0–10.5)
nRBC: 0 % (ref 0.0–0.2)

## 2023-07-29 LAB — POCT I-STAT, CHEM 8
BUN: 21 mg/dL (ref 8–23)
Calcium, Ion: 1.16 mmol/L (ref 1.15–1.40)
Chloride: 98 mmol/L (ref 98–111)
Creatinine, Ser: 1.6 mg/dL — ABNORMAL HIGH (ref 0.61–1.24)
Glucose, Bld: 367 mg/dL — ABNORMAL HIGH (ref 70–99)
HCT: 48 % (ref 39.0–52.0)
Hemoglobin: 16.3 g/dL (ref 13.0–17.0)
Potassium: 4.2 mmol/L (ref 3.5–5.1)
Sodium: 135 mmol/L (ref 135–145)
TCO2: 27 mmol/L (ref 22–32)

## 2023-07-29 LAB — GLUCOSE, CAPILLARY
Glucose-Capillary: 340 mg/dL — ABNORMAL HIGH (ref 70–99)
Glucose-Capillary: 276 mg/dL — ABNORMAL HIGH (ref 70–99)
Glucose-Capillary: 254 mg/dL — ABNORMAL HIGH (ref 70–99)

## 2023-07-29 SURGERY — DRUG INDUCED SLEEP ENDOSCOPY
Anesthesia: Monitor Anesthesia Care

## 2023-07-29 MED ORDER — SODIUM CHLORIDE 0.9 % IV SOLN
INTRAVENOUS | Status: DC
Start: 2023-07-29 — End: 2023-07-29

## 2023-07-29 MED ORDER — OXYMETAZOLINE HCL 0.05 % NA SOLN
NASAL | Status: DC | PRN
  Administered 2023-07-29: 1

## 2023-07-29 MED ORDER — INSULIN ASPART 100 UNIT/ML IJ SOLN
0.0000 [IU] | INTRAMUSCULAR | Status: AC | PRN
  Administered 2023-07-29: 12 [IU] via SUBCUTANEOUS
  Filled 2023-07-29: qty 1

## 2023-07-29 MED ORDER — CHLORHEXIDINE GLUCONATE 0.12 % MT SOLN
15.0000 mL | Freq: Once | OROMUCOSAL | Status: AC
  Administered 2023-07-29: 15 mL via OROMUCOSAL
  Filled 2023-07-29: qty 15

## 2023-07-29 MED ORDER — ORAL CARE MOUTH RINSE: 15.0000 mL | Freq: Once | OROMUCOSAL | Status: AC

## 2023-07-29 MED ORDER — OXYMETAZOLINE HCL 0.05 % NA SOLN
NASAL | Status: AC
  Filled 2023-07-29: qty 30

## 2023-07-29 MED ORDER — SODIUM CHLORIDE 0.9 % IV SOLN: INTRAVENOUS | Status: DC | PRN

## 2023-07-29 MED ORDER — PROPOFOL 500 MG/50ML IV EMUL
INTRAVENOUS | Status: DC | PRN
  Administered 2023-07-29: 50 ug/kg/min via INTRAVENOUS

## 2023-07-29 MED ORDER — INSULIN ASPART 100 UNIT/ML IJ SOLN
INTRAMUSCULAR | Status: AC
  Administered 2023-07-29: 8 [IU] via SUBCUTANEOUS
  Filled 2023-07-29: qty 1

## 2023-07-29 SURGICAL SUPPLY — 12 items
BAG COUNTER SPONGE SURGICOUNT (BAG) IMPLANT
CANISTER SUCT 1200ML W/VALVE (MISCELLANEOUS) ×1 IMPLANT
GLOVE BIO SURGEON STRL SZ7.5 (GLOVE) ×1 IMPLANT
KIT BASIN OR (CUSTOM PROCEDURE TRAY) ×1 IMPLANT
NDL PRECISIONGLIDE 27X1.5 (NEEDLE) IMPLANT
NEEDLE PRECISIONGLIDE 27X1.5 (NEEDLE)
PATTIES SURGICAL .5 X3 (DISPOSABLE) ×1 IMPLANT
SHEET MEDIUM DRAPE 40X70 STRL (DRAPES) IMPLANT
SOL ANTI FOG 6CC (MISCELLANEOUS) ×1 IMPLANT
SYR CONTROL 10ML LL (SYRINGE) IMPLANT
TOWEL GREEN STERILE FF (TOWEL DISPOSABLE) ×1 IMPLANT
TUBE CONNECTING 20X1/4 (TUBING) ×1 IMPLANT

## 2023-07-29 NOTE — Brief Op Note (Signed)
07/29/2023  4:39 PM  PATIENT:  Lin Landsman  62 y.o. male  PRE-OPERATIVE DIAGNOSIS:  Obstructive sleep apnea  POST-OPERATIVE DIAGNOSIS:  Obstructive sleep apnea  PROCEDURE:  Procedure(s): DRUG INDUCED SLEEP ENDOSCOPY (N/A)  SURGEON:  Surgeons and Role:    Christia Reading, MD - Primary  PHYSICIAN ASSISTANT:   ASSISTANTS: none   ANESTHESIA:   IV sedation  EBL:  0 mL   BLOOD ADMINISTERED:none  DRAINS: none   LOCAL MEDICATIONS USED:  NONE  SPECIMEN:  No Specimen  DISPOSITION OF SPECIMEN:  N/A  COUNTS:  YES  TOURNIQUET:  * No tourniquets in log *  DICTATION: .Note written in EPIC  PLAN OF CARE: Discharge to home after PACU  PATIENT DISPOSITION:  PACU - hemodynamically stable.   Delay start of Pharmacological VTE agent (>24hrs) due to surgical blood loss or risk of bleeding: no

## 2023-07-29 NOTE — Anesthesia Postprocedure Evaluation (Signed)
Anesthesia Post Note  Patient: Nathaniel Colon  Procedure(s) Performed: DRUG INDUCED SLEEP ENDOSCOPY     Patient location during evaluation: PACU Anesthesia Type: MAC Level of consciousness: awake and alert Pain management: pain level controlled Vital Signs Assessment: post-procedure vital signs reviewed and stable Respiratory status: spontaneous breathing, nonlabored ventilation and respiratory function stable Cardiovascular status: blood pressure returned to baseline and stable Postop Assessment: no apparent nausea or vomiting Anesthetic complications: no   No notable events documented.  Last Vitals:  Vitals:   07/29/23 1644 07/29/23 1645  BP: (!) 138/93 (!) 138/96  Pulse: 78 77  Resp: 12 12  Temp: (!) 36.3 C   SpO2: 95% 96%    Last Pain:  Vitals:   07/29/23 1644  PainSc: 0-No pain                 Lannie Fields

## 2023-07-29 NOTE — H&P (Signed)
Nathaniel Colon is an 62 y.o. male.   Chief Complaint: Sleep apnea HPI: 62 year old male with sleep apnea who has not been able to tolerate CPAP.  Past Medical History:  Diagnosis Date   Anginal pain (HCC)    Arthritis    Chronic kidney disease    Complication of anesthesia    patient reports "hard to wake up after having EGD   COPD (chronic obstructive pulmonary disease) (HCC)    Coronary artery disease    Diabetes mellitus    Dyspnea    GERD (gastroesophageal reflux disease)    H/O hiatal hernia    Hypertension    Neuromuscular disorder (HCC)    MUSCLE SPASMS   Peripheral vascular disease (HCC)    PERIPHERAL NEUROPATHY    Sleep apnea     Past Surgical History:  Procedure Laterality Date   ANTERIOR CERVICAL DECOMP/DISCECTOMY FUSION  08/30/2011   Procedure: ANTERIOR CERVICAL DECOMPRESSION/DISCECTOMY FUSION 2 LEVELS;  Surgeon: Eldred Manges, MD;  Location: [facility] OR;  Service: Orthopedics;  Laterality: N/A;  C5-6, C6-7 Anterior Cervical Discectomy and Fusion, Allograft, Plate   CARDIAC CATHETERIZATION Left 05/01/2016   Procedure: Left Heart Cath and Coronary Angiography;  Surgeon: Lamar Blinks, MD;  Location: [facility];  Service: Cardiovascular;  Laterality: Left;   CARDIAC CATHETERIZATION N/A 05/01/2016   Procedure: Coronary Stent Intervention;  Surgeon: Alwyn Pea, MD;  Location: [facility];  Service: Cardiovascular;  Laterality: N/A;   LATERAL EPICONDYLE RELEASE  08/30/2011   Procedure: TENNIS ELBOW RELEASE;  Surgeon: Eldred Manges, MD;  Location: [facility] OR;  Service: Orthopedics;  Laterality: Right;  Right Lateral Epicondyle Release, Drilling Repair   NO PAST SURGERIES      History reviewed. No pertinent family history. Social History:  reports that he quit smoking about 6 years ago. His smoking use included cigarettes. He started smoking about 36 years ago. He has a 15 pack-year smoking history. He uses smokeless tobacco. He reports current alcohol use. He  reports that he does not use drugs.  Allergies: No Known Allergies  Medications Prior to Admission  Medication Sig Dispense Refill   albuterol (VENTOLIN HFA) 108 (90 Base) MCG/ACT inhaler Inhale 2 puffs into the lungs every 6 (six) hours as needed for wheezing or shortness of breath. 8 g 3   aluminum hydroxide-magnesium carbonate (ACID GONE) 95-358 MG/15ML SUSP Take 30 mLs by mouth 4 (four) times daily as needed for heartburn.     amLODipine (NORVASC) 5 MG tablet Take 1 tablet (5 mg total) by mouth daily. 30 tablet 2   aspirin EC 81 MG EC tablet Take 1 tablet (81 mg total) by mouth daily. 30 tablet 0   capsaicin (ZOSTRIX) 0.025 % cream Apply 1 Application topically 2 (two) times daily as needed (foot pain).     carboxymethylcellulose (REFRESH PLUS) 0.5 % SOLN Place 1-2 drops into both eyes as needed (irritation).     cholecalciferol (VITAMIN D3) 25 MCG (1000 UNIT) tablet Take 1,000 Units by mouth daily.     DULoxetine (CYMBALTA) 60 MG capsule Take 60 mg by mouth daily.     esomeprazole (NEXIUM) 40 MG capsule Take 40 mg by mouth 2 (two) times daily before a meal.     famotidine (PEPCID) 40 MG tablet Take 40 mg by mouth at bedtime.     ferrous sulfate 325 (65 FE) MG tablet Take 1 tablet (325 mg total) by mouth 3 (three) times a week. Take on Monday,  Wednesday, and friday 100 tablet 3   finasteride (PROSCAR) 5 MG tablet Take 1 tablet (5 mg total) by mouth daily. 30 tablet 2   Fluticasone-Umeclidin-Vilant (TRELEGY ELLIPTA) 100-62.5-25 MCG/ACT AEPB Inhale 1 puff into the lungs in the morning. 1 each 0   gabapentin (NEURONTIN) 400 MG capsule Take 2 capsules (800 mg total) by mouth 2 (two) times daily. (Patient taking differently: Take 400-800 mg by mouth See admin instructions. Take 400 mg twice daily and 800 mg at bedtime) 60 capsule 2   Glucagon, rDNA, (GLUCAGON EMERGENCY) 1 MG KIT Inject 1 mg into the vein as needed (low blood sugar).     glucose 4 GM chewable tablet Chew 2 tablets by mouth as  needed for low blood sugar.     Insulin Aspart FlexPen (NOVOLOG) 100 UNIT/ML Inject 35-50 Units into the skin in the morning, at noon, and at bedtime.     insulin glargine-yfgn (SEMGLEE) 100 UNIT/ML injection Inject 40 Units into the skin 2 (two) times daily.     lidocaine (LIDODERM) 5 % Place 1 patch onto the skin daily as needed (pain). Apply to back     Melatonin 3 MG CAPS Take 6 mg by mouth at bedtime.     metoCLOPramide (REGLAN) 10 MG tablet Take 5 mg by mouth in the morning and at bedtime.     metoprolol succinate (TOPROL-XL) 50 MG 24 hr tablet Take 0.5 tablets (25 mg total) by mouth daily. 30 tablet 2   rosuvastatin (CRESTOR) 40 MG tablet Take 20 mg by mouth daily.     senna-docusate (SENOKOT-S) 8.6-50 MG tablet Take 2 tablets by mouth at bedtime.     sitaGLIPtin (JANUVIA) 50 MG tablet Take 50 mg by mouth daily.     sucralfate (CARAFATE) 1 GM/10ML suspension Take 1 g by mouth 4 (four) times daily as needed (acid reflux).     tamsulosin (FLOMAX) 0.4 MG CAPS capsule Take 2 capsules (0.8 mg total) by mouth at bedtime. 60 capsule 2   vitamin B-12 (CYANOCOBALAMIN) 500 MCG tablet Take 1,000 mcg by mouth daily.     diclofenac Sodium (VOLTAREN) 1 % GEL Apply 1 Application topically 2 (two) times daily as needed (back pain).     nitroGLYCERIN (NITROSTAT) 0.4 MG SL tablet Place 0.4 mg under the tongue every 5 (five) minutes as needed for chest pain.      Results for orders placed or performed during the hospital encounter of 07/29/23 (from the past 48 hours)  CBC per protocol     Status: None   Collection Time: 07/29/23  1:45 PM  Result Value Ref Range   WBC 8.2 4.0 - 10.5 K/uL   RBC 5.66 4.22 - 5.81 MIL/uL   Hemoglobin 16.3 13.0 - 17.0 g/dL   HCT 40.9 81.1 - 91.4 %   MCV 89.6 80.0 - 100.0 fL   MCH 28.8 26.0 - 34.0 pg   MCHC 32.1 30.0 - 36.0 g/dL   RDW 78.2 95.6 - 21.3 %   Platelets 241 150 - 400 K/uL   nRBC 0.0 0.0 - 0.2 %    Comment: Performed at [facility], [address], [place], [place] [address]  Glucose, capillary     Status: Abnormal   Collection Time: 07/29/23  2:06 PM  Result Value Ref Range   Glucose-Capillary 340 (H) 70 - 99 mg/dL    Comment: Glucose reference range applies only to samples taken after fasting for at least 8 hours.   Comment 1  Notify RN   I-STAT, chem 8     Status: Abnormal   Collection Time: 07/29/23  2:57 PM  Result Value Ref Range   Sodium 135 135 - 145 mmol/L   Potassium 4.2 3.5 - 5.1 mmol/L   Chloride 98 98 - 111 mmol/L   BUN 21 8 - 23 mg/dL   Creatinine, Ser 2.44 (H) 0.61 - 1.24 mg/dL   Glucose, Bld 010 (H) 70 - 99 mg/dL    Comment: Glucose reference range applies only to samples taken after fasting for at least 8 hours.   Calcium, Ion 1.16 1.15 - 1.40 mmol/L   TCO2 27 22 - 32 mmol/L   Hemoglobin 16.3 13.0 - 17.0 g/dL   HCT 27.2 53.6 - 64.4 %   No results found.  Review of Systems  All other systems reviewed and are negative.   Blood pressure (!) 166/88, pulse 83, resp. rate 13, SpO2 96%. Physical Exam Constitutional:      Appearance: Normal appearance. He is normal weight.  HENT:     Head: Normocephalic and atraumatic.     Right Ear: External ear normal.     Left Ear: External ear normal.     Nose: Nose normal.     Mouth/Throat:     Mouth: Mucous membranes are moist.     Pharynx: Oropharynx is clear.  Eyes:     Extraocular Movements: Extraocular movements intact.     Conjunctiva/sclera: Conjunctivae normal.     Pupils: Pupils are equal, round, and reactive to light.  Cardiovascular:     Rate and Rhythm: Normal rate.  Pulmonary:     Effort: Pulmonary effort is normal.  Musculoskeletal:     Cervical back: Normal range of motion.  Skin:    General: Skin is warm and dry.  Neurological:     General: No focal deficit present.     Mental Status: He is alert and oriented to person, place, and time.  Psychiatric:        Mood and Affect: Mood normal.        Behavior: Behavior normal.        Thought  Content: Thought content normal.        Judgment: Judgment normal.      Assessment/Plan Obstructive sleep apnea and BMI 27.22  To OR for hypoglossal nerve stimulator placement.  Christia Reading, MD 07/29/2023, 3:09 PM

## 2023-07-29 NOTE — Transfer of Care (Signed)
Immediate Anesthesia Transfer of Care Note  Patient: Nathaniel Colon  Procedure(s) Performed: DRUG INDUCED SLEEP ENDOSCOPY  Patient Location: PACU  Anesthesia Type:MAC  Level of Consciousness: awake, oriented, and patient cooperative  Airway & Oxygen Therapy: Patient Spontanous Breathing and Patient connected to nasal cannula oxygen  Post-op Assessment: Report given to RN, Post -op Vital signs reviewed and stable, and Patient moving all extremities X 4  Post vital signs: Reviewed and stable  Last Vitals:  Vitals Value Taken Time  BP    Temp 97.3   Pulse 79 07/29/23 1647  Resp 11 07/29/23 1647  SpO2 95 % 07/29/23 1647  Vitals shown include unfiled device data.  Last Pain:  Vitals:   07/29/23 1432  PainSc: 3          Complications: No notable events documented.

## 2023-07-29 NOTE — Op Note (Signed)
Preop diagnosis: Obstructive sleep apnea Postop diagnosis: same Procedure: Drug-induced sleep endoscopy Surgeon: Jenne Pane Anesth: IV sedation Compl: None Findings: There is 50% anterior-posterior collapse at the velum making him a candidate for hypoglossal nerve stimulator placement.  There was also some anterior-posterior collapse at the tongue base. Description:  After discussing risks, benefits, and alternatives, the patient was brought to the operative suite and placed on the operative table in the supine position.  Anesthesia was induced and the patient was given light sedation to simulate natural sleep. When the proper level was reached, an Afrin-soaked pledget was placed in the right nasal passage for a couple of minutes and then removed.  The fiberoptic laryngoscope was then passed to view the pharynx and larynx.  Findings are noted above and the exam was recorded.  After completion, the scope was removed and the patient was returned to anesthesia for wakeup and was moved to the recovery room in stable condition.

## 2023-07-30 ENCOUNTER — Encounter (HOSPITAL_COMMUNITY): Payer: Self-pay | Admitting: Otolaryngology

## 2023-08-20 ENCOUNTER — Other Ambulatory Visit: Payer: Self-pay | Admitting: Otolaryngology

## 2023-08-21 ENCOUNTER — Other Ambulatory Visit: Payer: Self-pay

## 2023-08-21 ENCOUNTER — Encounter (HOSPITAL_COMMUNITY): Payer: Self-pay | Admitting: Otolaryngology

## 2023-08-21 NOTE — Progress Notes (Addendum)
 SDW CALL  Patient was given pre-op instructions over the phone. The opportunity was given for the patient to ask questions. No further questions asked. Patient verbalized understanding of instructions given.   PCP - Denece Duncans at [facility]ologist - [facility]  PPM/ICD - denies Device Orders - n/a Rep Notified - n/a  Chest x-ray - 04/15/23 EKG - 04/16/23 Stress Test - 03/16/16 ECHO - 04/16/23 Cardiac Cath - 2017   Sleep Study - OSA+ CPAP - pt states that he does not wear CPAP but wears 2L oxygen at all times  Fasting Blood Sugar - this morning patient states that it was 325.  He states that he checks it 6-7 times a day; currently not wearing his Libra Freestyle.  He said it is always up and down.  When speaking to the patient, he stated that it was currently 200.  Patient educated that he needs to continue checking his blood sugar over the new few days and he understands that if his blood sugar is elevated on the DOS, that his procedure could be cancelled.  Patient also verbalized understanding of what to do if his blood sugar became less than 70.    Last dose of GLP1 agonist-  n/a GLP1 instructions: n/a  Blood Thinner Instructions: n/a Aspirin  Instructions: Patient states that Dr. Carlie told him that he could continue taking his Aspirin  for this procedure  Nitroglycerin  - last dose about 1 year ago  Patient stated that he was on Ceftin  for a urinary tract infection but he has completed his course.    ERAS Protcol - clears until 0730 PRE-SURGERY Ensure or G2- n/a  COVID TEST- n/a   Anesthesia review: yes  Patient denies shortness of breath, fever, cough and chest pain over the phone call   All instructions explained to the patient, with a verbal understanding of the material. Patient agrees to go over the instructions while at home for a better understanding.

## 2023-08-22 NOTE — Anesthesia Preprocedure Evaluation (Addendum)
 Anesthesia Evaluation  Patient identified by MRN, date of birth, ID band Patient awake    Reviewed: Allergy & Precautions, NPO status , Patient's Chart, lab work & pertinent test results, reviewed documented beta blocker date and time   History of Anesthesia Complications (+) PROLONGED EMERGENCE and history of anesthetic complications  Airway Mallampati: III       Dental  (+) Teeth Intact, Dental Advisory Given   Pulmonary sleep apnea (unable to tolerate CPAP) , COPD (2LPM [place]),  oxygen dependent, former smoker Quit smoking 2018, 15 pack year history    Pulmonary exam normal breath sounds clear to auscultation       Cardiovascular hypertension, Pt. on medications and Pt. on home beta blockers + CAD, + Cardiac Stents (DES pRCA & mLAD for angina 05/01/16), + Peripheral Vascular Disease and +CHF  Normal cardiovascular exam Rhythm:Regular Rate:Normal  Nuclear stress test was done at the [facility] on 12/05/22. Appears this was arranged after pulmonologist Dr. Annella recommended referral back to cardiology given DOE for at least the past year but noticed symptoms as far back as 2013 following ACDF. He suspected DOE multi-factorial including chronic bronchitis, possibly chemical pneumonitis/bronchitis from GERD/possible aspiration events. He also described some LE weakness, so neurology evaluation recommended.  Stress test results are not viewable in [facility], but are outlined in 06/30/23 [facility] cardiology notes and showed evidence of prior small inferior infarct but no ischemia, EF 46% but had been 60 to 65% on echocardiogram the month prior. Notes suggest no additional testing was recommended at that time due to low risk stress test, normal EF by recent echo, and due to CKD and uncontrolled DM.    Echo done on 04/16/23 during admission for acute cystitis showed LVEF 55 to 60%, no regional wall motion abnormalities, grade 2 diastolic dysfunction, normal  RV systolic function, mild AV calcification, aortic root 42 mm.     Last cardiology note found was by Duwayne Lloyd, PA-C at the [facility] on 06/30/23 for routine cardiology follow-up. Per note, patient was doing well from CV standpoint at present. Previous chest discomfort/tightness had improved (reported last nitroglycerin  ~ 1 year ago), chronic DOE stable on home O2. Denied palpitations, syncope, orthopnea, PND, swelling and reported adherence to medical regimen. If refractory CV symptoms on medical therapy then would consider LHC, otherwise plan to continue current regiment with six month follow-up.    Neuro/Psych  PSYCHIATRIC DISORDERS  Depression     Neuromuscular disease (peripheral neuropathy 2/2 T2DM)    GI/Hepatic Neg liver ROS, hiatal hernia,GERD  Controlled,,Gastroparesis 2/2 T2DM   Endo/Other  diabetes, Poorly Controlled, Type 2, Insulin  Dependent  A1c 10.4, FS 170 this AM  Renal/GU Renal InsufficiencyRenal disease  negative genitourinary   Musculoskeletal  (+) Arthritis , Osteoarthritis,    Abdominal   Peds  Hematology negative hematology ROS (+)   Anesthesia Other Findings   Reproductive/Obstetrics negative OB ROS                             Anesthesia Physical Anesthesia Plan  ASA: 3  Anesthesia Plan: General   Post-op Pain Management: Tylenol  PO (pre-op)*   Induction: Intravenous and Rapid sequence  PONV Risk Score and Plan: 2 and Ondansetron , Dexamethasone, Midazolam  and Treatment may vary due to age or medical condition  Airway Management Planned: Oral ETT and Video Laryngoscope Planned  Additional Equipment: None  Intra-op Plan:   Post-operative Plan: Extubation in OR  Informed Consent: I have  reviewed the patients History and Physical, chart, labs and discussed the procedure including the risks, benefits and alternatives for the proposed anesthesia with the patient or authorized representative who has indicated  his/her understanding and acceptance.     Dental advisory given  Plan Discussed with: CRNA  Anesthesia Plan Comments: (  )       Anesthesia Quick Evaluation

## 2023-08-22 NOTE — Progress Notes (Signed)
 Anesthesia Chart Review: Nathaniel MICHAE Colon  Case: [id number] Date/Time: 08/25/23 1015   Procedure: IMPLANTATION OF HYPOGLOSSAL NERVE STIMULATOR   Anesthesia type: General   Pre-op diagnosis: Obstructive Sleep Apnea   [facility]: [facility] ROOM 08 / [facility]   Surgeons: Carlie Clark, MD       DISCUSSION: Patient is a 63 year old male scheduled for the above procedure. S/p drug induced sleep endoscopy on 07/29/23.    History includes former smoker (quit 08/12/16), CAD (DES pRCA & mLAD for angina 05/01/16), HTN, DM2 (with peripheral neuropathy, gastroparesis), PVD, OSA (intolerant to CPAP), dyspnea, COPD, home O2 (2L/[place]), CKD, hiatal hernia, GERD, spinal surgery (C5-7 ACDF 08/30/11). Reported hard to wake up after having EGD.    Nuclear stress test was done at the [facility] on 12/05/22. Appears this was arranged after pulmonologist Dr. Annella recommended referral back to cardiology given DOE for at least the past year but noticed symptoms as far back as 2013 following ACDF. He suspected DOE multi-factorial including chronic bronchitis, possibly chemical pneumonitis/bronchitis from GERD/possible aspiration events. He also described some LE weakness, so neurology evaluation recommended.  Stress test results are not viewable in [facility], but are outlined in 06/30/23 [facility] cardiology notes and showed evidence of prior small inferior infarct but no ischemia, EF 46% but had been 60 to 65% on echocardiogram the month prior. Notes suggest no additional testing was recommended at that time due to low risk stress test, normal EF by recent echo, and due to CKD and uncontrolled DM.   Echo done on 04/16/23 during admission for acute cystitis showed LVEF 55 to 60%, no regional wall motion abnormalities, grade 2 diastolic dysfunction, normal RV systolic function, mild AV calcification, aortic root 42 mm.    Last cardiology note found was by Duwayne Lloyd, PA-C at the [facility] on 06/30/23 for routine cardiology follow-up.  Per note, patient was doing well from CV standpoint at present. Previous chest discomfort/tightness had improved (reported last nitroglycerin  ~ 1 year ago), chronic DOE stable on home O2. Denied palpitations, syncope, orthopnea, PND, swelling and reported adherence to medical regimen. If refractory CV symptoms on medical therapy then would consider LHC, otherwise plan to continue current regiment with six month follow-up.   A1c 10.4% on 04/15/23--in setting of running out of insulin  2 weeks prior. He has a Libre III Freestyle continuous glucose monitor, but was not wearing as of 08/21/23. He reported home CBGs up and down. Reported recent readings of 200 and 325. Current DM medication per list include Januvia 50 mg daily, Semglee 40 units BID (taking 20 units BID), Novolog  100 unit/ml 35-50 units TID.   He reported completing Ceftin  for cystitis, prescribed 08/07/23.  He is a same day work-up, so he will get labs on arrival as indicated including CBG. Anesthesia team to evaluate on the day of surgery.    VS:  Wt Readings from Last 3 Encounters:  04/17/23 94.9 kg  12/23/22 96.3 kg  12/16/22 95.7 kg   BP Readings from Last 3 Encounters:  07/29/23 (!) 146/97  04/17/23 (!) 126/93  12/23/22 120/80   Pulse Readings from Last 3 Encounters:  07/29/23 74  04/17/23 68  12/23/22 85     PROVIDERS: [facility], [facility] ([place], [place], DO is PCP) Annella Cough, MD is pulmonologist    LABS: Most recent lab results from 08/07/23 ([facility]) include CBC 10.0, hemoglobin 16.1, hematocrit 50.8, platelet count 243, sodium 135, glucose 405, BUN 30, creatinine 1.80 (previously 1.54-1.78 since 04/2023 in [facility] and [facility]) ,  ALT 18, AST 17.   Unsuccessful attempt at PFTs on 09/19/22.      IMAGES: MRI T/L Spine 06/12/23 [facility]): Impression: Thoracolumbar degenerative changes as above without foraminal  stenosis or canal stenosis.    Gastric emptying study 09/06/22 [facility]): Impression: Abnormal  nuclear medicine gastric emptying examination with  elevated degree of retained gastric contents throughout majority  of this exam.      EKG: EKG 08/07/23 ([facility]): Per Narrative in [facility]: Diagnosis Normal sinus rhythm  Normal ECG  When compared with ECG of 07-Feb-2023 16:00,  No significant change was found  Normal axis  No acute injury pattern  Brien Kung ([id number]) on 08/07/2023 7:36:40 PM certifies that he/she has reviewed the ECG tracing and confirms the independent  interpretation is  correct.    EKG 04/16/23: NSR     CV: Echo 04/16/23: IMPRESSIONS   1. Left ventricular ejection fraction, by estimation, is 55 to 60%. The  left ventricle has normal function. The left ventricle has no regional  wall motion abnormalities. Left ventricular diastolic parameters are  consistent with Grade II diastolic  dysfunction (pseudonormalization).   2. Right ventricular systolic function is normal. The right ventricular  size is normal. Tricuspid regurgitation signal is inadequate for assessing  PA pressure.   3. The mitral valve is normal in structure. No evidence of mitral valve  regurgitation.   4. The aortic valve is tricuspid. There is mild calcification of the  aortic valve. Aortic valve regurgitation is not visualized.   5. There is mild dilatation of the aortic root, measuring 42 mm.   6. The inferior vena cava is normal in size with greater than 50%  respiratory variability, suggesting right atrial pressure of 3 mmHg.      Nuclear stress test 12/05/22 [facility]): Cannot view results in Care Everywhere, but per 06/30/23 [facility] note: STRESS TEST =========== 11/2022 Impression:  1. There is evidence of a prior small inferior infarct, with no ischemia. 2. LV EF 46%, Normal LV end-diastolic volumes. (LVEF had been 60-65% on March 2024 echo, so no further work-up planned.)   . Instead, notation reads "Please see cardiology consult in CPRS for report." Report requested from the  [facility].  Per 12/23/22 Progress Note by Dr. Annella, Reviewed recent cardiology evaluation which showed no ischemia on perfusion scan, normal EF on TTE despite decreased EF on perfusion scan. No further workup recommended from their standpoint.    Past Medical History:  Diagnosis Date   Anginal pain (HCC)    Arthritis    Chronic kidney disease    Complication of anesthesia    patient reports hard to wake up after having EGD   COPD (chronic obstructive pulmonary disease) (HCC)    Coronary artery disease    Depression    Diabetes mellitus    Diabetic gastroparesis (HCC)    Dyspnea    GERD (gastroesophageal reflux disease)    H/O hiatal hernia    Hypertension    Neuromuscular disorder (HCC)    MUSCLE SPASMS   Peripheral vascular disease (HCC)    PERIPHERAL NEUROPATHY    Sleep apnea     Past Surgical History:  Procedure Laterality Date   ANTERIOR CERVICAL DECOMP/DISCECTOMY FUSION  08/30/2011   Procedure: ANTERIOR CERVICAL DECOMPRESSION/DISCECTOMY FUSION 2 LEVELS;  Surgeon: Oneil JAYSON Herald, MD;  Location: [facility] OR;  Service: Orthopedics;  Laterality: N/A;  C5-6, C6-7 Anterior Cervical Discectomy and Fusion, Allograft, Plate   CARDIAC CATHETERIZATION Left 05/01/2016   Procedure: Left  Heart Cath and Coronary Angiography;  Surgeon: Wolm JINNY Rhyme, MD;  Location: [facility] INVASIVE CV LAB;  Service: Cardiovascular;  Laterality: Left;   CARDIAC CATHETERIZATION N/A 05/01/2016   Procedure: Coronary Stent Intervention;  Surgeon: Cara JONETTA Lovelace, MD;  Location: [facility] INVASIVE CV LAB;  Service: Cardiovascular;  Laterality: N/A;   DRUG INDUCED ENDOSCOPY N/A 07/29/2023   Procedure: DRUG INDUCED SLEEP ENDOSCOPY;  Surgeon: Carlie Clark, MD;  Location: [facility] OR;  Service: ENT;  Laterality: N/A;   LATERAL EPICONDYLE RELEASE  08/30/2011   Procedure: TENNIS ELBOW RELEASE;  Surgeon: Oneil JAYSON Herald, MD;  Location: [facility];  Service: Orthopedics;  Laterality: Right;  Right Lateral Epicondyle Release, Drilling Repair     MEDICATIONS: No current facility-administered medications for this encounter.    albuterol  (VENTOLIN  HFA) 108 (90 Base) MCG/ACT inhaler   aluminum hydroxide-magnesium carbonate (ACID GONE) 95-358 MG/15ML SUSP   amLODipine  (NORVASC ) 5 MG tablet   aspirin  EC 81 MG EC tablet   capsaicin (ZOSTRIX) 0.025 % cream   carboxymethylcellulose (REFRESH PLUS) 0.5 % SOLN   cefUROXime  (CEFTIN ) 500 MG tablet   cholecalciferol  (VITAMIN D3) 25 MCG (1000 UNIT) tablet   diclofenac  Sodium (VOLTAREN ) 1 % GEL   DULoxetine  (CYMBALTA ) 60 MG capsule   esomeprazole (NEXIUM) 40 MG capsule   famotidine (PEPCID) 40 MG tablet   ferrous sulfate  325 (65 FE) MG tablet   finasteride  (PROSCAR ) 5 MG tablet   Fluticasone -Umeclidin-Vilant (TRELEGY ELLIPTA ) 100-62.5-25 MCG/ACT AEPB   gabapentin  (NEURONTIN ) 400 MG capsule   Glucagon, rDNA, (GLUCAGON EMERGENCY) 1 MG KIT   glucose 4 GM chewable tablet   Insulin  Aspart FlexPen (NOVOLOG ) 100 UNIT/ML   insulin  glargine-yfgn (SEMGLEE) 100 UNIT/ML injection   lidocaine  (LIDODERM ) 5 %   Melatonin 3 MG CAPS   metoCLOPramide  (REGLAN ) 10 MG tablet   metoprolol  succinate (TOPROL -XL) 50 MG 24 hr tablet   nitroGLYCERIN  (NITROSTAT ) 0.4 MG SL tablet   rosuvastatin  (CRESTOR ) 40 MG tablet   senna-docusate (SENOKOT-S) 8.6-50 MG tablet   sitaGLIPtin (JANUVIA) 50 MG tablet   sucralfate (CARAFATE) 1 GM/10ML suspension   tamsulosin  (FLOMAX ) 0.4 MG CAPS capsule   vitamin B-12 (CYANOCOBALAMIN ) 500 MCG tablet   Isaiah Ruder, PA-C Surgical Short Stay/Anesthesiology [facility] Phone [phone] [facility] Phone [phone] 08/22/2023 1:54 PM

## 2023-08-25 ENCOUNTER — Ambulatory Visit (HOSPITAL_BASED_OUTPATIENT_CLINIC_OR_DEPARTMENT_OTHER): Payer: No Typology Code available for payment source | Admitting: Physician Assistant

## 2023-08-25 ENCOUNTER — Other Ambulatory Visit: Payer: Self-pay

## 2023-08-25 ENCOUNTER — Ambulatory Visit (HOSPITAL_COMMUNITY): Payer: No Typology Code available for payment source

## 2023-08-25 ENCOUNTER — Ambulatory Visit (HOSPITAL_COMMUNITY)
Admission: RE | Admit: 2023-08-25 | Discharge: 2023-08-25 | Disposition: A | Discharge status: Home / Self Care | Payer: No Typology Code available for payment source | Source: Ambulatory Visit | Attending: Otolaryngology | Admitting: Otolaryngology

## 2023-08-25 ENCOUNTER — Encounter (HOSPITAL_COMMUNITY): Payer: Self-pay | Admitting: Otolaryngology

## 2023-08-25 ENCOUNTER — Encounter (HOSPITAL_COMMUNITY)
Admission: RE | Disposition: A | Discharge status: Home / Self Care | Payer: Self-pay | Source: Ambulatory Visit | Attending: Otolaryngology

## 2023-08-25 ENCOUNTER — Ambulatory Visit (HOSPITAL_COMMUNITY): Payer: No Typology Code available for payment source | Admitting: Physician Assistant

## 2023-08-25 DIAGNOSIS — K3184 Gastroparesis: Secondary | ICD-10-CM | POA: Diagnosis not present

## 2023-08-25 DIAGNOSIS — Z955 Presence of coronary angioplasty implant and graft: Secondary | ICD-10-CM | POA: Diagnosis not present

## 2023-08-25 DIAGNOSIS — E1165 Type 2 diabetes mellitus with hyperglycemia: Secondary | ICD-10-CM | POA: Insufficient documentation

## 2023-08-25 DIAGNOSIS — K219 Gastro-esophageal reflux disease without esophagitis: Secondary | ICD-10-CM | POA: Diagnosis not present

## 2023-08-25 DIAGNOSIS — Z79899 Other long term (current) drug therapy: Secondary | ICD-10-CM | POA: Diagnosis not present

## 2023-08-25 DIAGNOSIS — F419 Anxiety disorder, unspecified: Secondary | ICD-10-CM | POA: Insufficient documentation

## 2023-08-25 DIAGNOSIS — Z794 Long term (current) use of insulin: Secondary | ICD-10-CM | POA: Diagnosis not present

## 2023-08-25 DIAGNOSIS — K449 Diaphragmatic hernia without obstruction or gangrene: Secondary | ICD-10-CM | POA: Diagnosis not present

## 2023-08-25 DIAGNOSIS — I1310 Hypertensive heart and chronic kidney disease without heart failure, with stage 1 through stage 4 chronic kidney disease, or unspecified chronic kidney disease: Secondary | ICD-10-CM | POA: Diagnosis not present

## 2023-08-25 DIAGNOSIS — N189 Chronic kidney disease, unspecified: Secondary | ICD-10-CM | POA: Diagnosis not present

## 2023-08-25 DIAGNOSIS — E1122 Type 2 diabetes mellitus with diabetic chronic kidney disease: Secondary | ICD-10-CM | POA: Diagnosis not present

## 2023-08-25 DIAGNOSIS — E1151 Type 2 diabetes mellitus with diabetic peripheral angiopathy without gangrene: Secondary | ICD-10-CM | POA: Insufficient documentation

## 2023-08-25 DIAGNOSIS — I10 Essential (primary) hypertension: Secondary | ICD-10-CM | POA: Diagnosis not present

## 2023-08-25 DIAGNOSIS — I2510 Atherosclerotic heart disease of native coronary artery without angina pectoris: Secondary | ICD-10-CM

## 2023-08-25 DIAGNOSIS — Z87891 Personal history of nicotine dependence: Secondary | ICD-10-CM | POA: Diagnosis not present

## 2023-08-25 DIAGNOSIS — F32A Depression, unspecified: Secondary | ICD-10-CM | POA: Insufficient documentation

## 2023-08-25 DIAGNOSIS — I509 Heart failure, unspecified: Secondary | ICD-10-CM | POA: Insufficient documentation

## 2023-08-25 DIAGNOSIS — E1143 Type 2 diabetes mellitus with diabetic autonomic (poly)neuropathy: Secondary | ICD-10-CM | POA: Diagnosis not present

## 2023-08-25 DIAGNOSIS — J449 Chronic obstructive pulmonary disease, unspecified: Secondary | ICD-10-CM | POA: Insufficient documentation

## 2023-08-25 DIAGNOSIS — G4733 Obstructive sleep apnea (adult) (pediatric): Secondary | ICD-10-CM | POA: Diagnosis present

## 2023-08-25 DIAGNOSIS — E1142 Type 2 diabetes mellitus with diabetic polyneuropathy: Secondary | ICD-10-CM | POA: Diagnosis not present

## 2023-08-25 DIAGNOSIS — Z7984 Long term (current) use of oral hypoglycemic drugs: Secondary | ICD-10-CM | POA: Diagnosis not present

## 2023-08-25 DIAGNOSIS — Z9981 Dependence on supplemental oxygen: Secondary | ICD-10-CM | POA: Insufficient documentation

## 2023-08-25 HISTORY — DX: Depression, unspecified: F32.A

## 2023-08-25 HISTORY — DX: Type 2 diabetes mellitus with diabetic autonomic (poly)neuropathy: E11.43

## 2023-08-25 HISTORY — PX: IMPLANTATION OF HYPOGLOSSAL NERVE STIMULATOR: SHX6827

## 2023-08-25 LAB — GLUCOSE, CAPILLARY
Glucose-Capillary: 170 mg/dL — ABNORMAL HIGH (ref 70–99)
Glucose-Capillary: 158 mg/dL — ABNORMAL HIGH (ref 70–99)
Glucose-Capillary: 213 mg/dL — ABNORMAL HIGH (ref 70–99)

## 2023-08-25 SURGERY — INSERTION, HYPOGLOSSAL NERVE STIMULATOR
Anesthesia: General | Site: Neck

## 2023-08-25 MED ORDER — OXYCODONE HCL 5 MG PO TABS
ORAL_TABLET | ORAL | Status: AC
  Filled 2023-08-25: qty 1

## 2023-08-25 MED ORDER — MIDAZOLAM HCL 2 MG/2ML IJ SOLN
INTRAMUSCULAR | Status: AC
  Filled 2023-08-25: qty 2

## 2023-08-25 MED ORDER — OXYCODONE HCL 5 MG PO TABS
5.0000 mg | ORAL_TABLET | Freq: Once | ORAL | Status: AC | PRN
  Administered 2023-08-25: 5 mg via ORAL

## 2023-08-25 MED ORDER — LIDOCAINE-EPINEPHRINE 1 %-1:100000 IJ SOLN
INTRAMUSCULAR | Status: AC
  Filled 2023-08-25: qty 1

## 2023-08-25 MED ORDER — INSULIN ASPART 100 UNIT/ML IJ SOLN
0.0000 [IU] | INTRAMUSCULAR | Status: DC | PRN
  Administered 2023-08-25: 2 [IU] via SUBCUTANEOUS
  Filled 2023-08-25: qty 1

## 2023-08-25 MED ORDER — ORAL CARE MOUTH RINSE: 15.0000 mL | Freq: Once | OROMUCOSAL | Status: AC

## 2023-08-25 MED ORDER — MIDAZOLAM HCL 2 MG/2ML IJ SOLN
INTRAMUSCULAR | Status: DC | PRN
  Administered 2023-08-25: 2 mg via INTRAVENOUS

## 2023-08-25 MED ORDER — CEFAZOLIN SODIUM-DEXTROSE 2-3 GM-%(50ML) IV SOLR
INTRAVENOUS | Status: DC | PRN
  Administered 2023-08-25: 2 g via INTRAVENOUS

## 2023-08-25 MED ORDER — HYDROCODONE-ACETAMINOPHEN 5-325 MG PO TABS: 1.0000 | ORAL_TABLET | Freq: Four times a day (QID) | ORAL | 0 refills | Status: DC | PRN

## 2023-08-25 MED ORDER — ONDANSETRON HCL 4 MG/2ML IJ SOLN
INTRAMUSCULAR | Status: DC | PRN
  Administered 2023-08-25: 4 mg via INTRAVENOUS

## 2023-08-25 MED ORDER — PROPOFOL 10 MG/ML IV BOLUS
INTRAVENOUS | Status: DC | PRN
  Administered 2023-08-25: 200 mg via INTRAVENOUS

## 2023-08-25 MED ORDER — AMISULPRIDE (ANTIEMETIC) 5 MG/2ML IV SOLN: 10.0000 mg | Freq: Once | INTRAVENOUS | Status: DC | PRN

## 2023-08-25 MED ORDER — STERILE WATER FOR IRRIGATION IR SOLN
Status: DC | PRN
  Administered 2023-08-25: 1000 mL

## 2023-08-25 MED ORDER — 0.9 % SODIUM CHLORIDE (POUR BTL) OPTIME
TOPICAL | Status: DC | PRN
  Administered 2023-08-25: 1000 mL

## 2023-08-25 MED ORDER — PHENYLEPHRINE 80 MCG/ML (10ML) SYRINGE FOR IV PUSH (FOR BLOOD PRESSURE SUPPORT)
PREFILLED_SYRINGE | INTRAVENOUS | Status: DC | PRN
  Administered 2023-08-25: 160 ug via INTRAVENOUS

## 2023-08-25 MED ORDER — FENTANYL CITRATE (PF) 250 MCG/5ML IJ SOLN
INTRAMUSCULAR | Status: DC | PRN
  Administered 2023-08-25 (×5): 50 ug via INTRAVENOUS

## 2023-08-25 MED ORDER — FENTANYL CITRATE (PF) 250 MCG/5ML IJ SOLN
INTRAMUSCULAR | Status: AC
  Filled 2023-08-25: qty 5

## 2023-08-25 MED ORDER — ACETAMINOPHEN 500 MG PO TABS: 1000.0000 mg | ORAL_TABLET | Freq: Once | ORAL | Status: DC

## 2023-08-25 MED ORDER — PHENYLEPHRINE HCL-NACL 20-0.9 MG/250ML-% IV SOLN
INTRAVENOUS | Status: DC | PRN
  Administered 2023-08-25: 40 ug/min via INTRAVENOUS

## 2023-08-25 MED ORDER — ACETAMINOPHEN 500 MG PO TABS
1000.0000 mg | ORAL_TABLET | Freq: Once | ORAL | Status: AC
  Administered 2023-08-25: 1000 mg via ORAL
  Filled 2023-08-25: qty 2

## 2023-08-25 MED ORDER — SODIUM CHLORIDE 0.9 % IV SOLN: INTRAVENOUS | Status: DC | PRN

## 2023-08-25 MED ORDER — HYDROMORPHONE HCL 1 MG/ML IJ SOLN
INTRAMUSCULAR | Status: AC
  Filled 2023-08-25: qty 1

## 2023-08-25 MED ORDER — OXYCODONE HCL 5 MG/5ML PO SOLN
5.0000 mg | Freq: Once | ORAL | Status: AC | PRN
Start: 2023-08-25 — End: 2023-08-25

## 2023-08-25 MED ORDER — DEXMEDETOMIDINE HCL IN NACL 80 MCG/20ML IV SOLN
INTRAVENOUS | Status: DC | PRN
  Administered 2023-08-25: 6 ug via INTRAVENOUS

## 2023-08-25 MED ORDER — PROPOFOL 10 MG/ML IV BOLUS
INTRAVENOUS | Status: AC
  Filled 2023-08-25: qty 20

## 2023-08-25 MED ORDER — SUCCINYLCHOLINE CHLORIDE 200 MG/10ML IV SOSY
PREFILLED_SYRINGE | INTRAVENOUS | Status: DC | PRN
  Administered 2023-08-25: 160 mg via INTRAVENOUS

## 2023-08-25 MED ORDER — CHLORHEXIDINE GLUCONATE 0.12 % MT SOLN
15.0000 mL | Freq: Once | OROMUCOSAL | Status: AC
  Administered 2023-08-25: 15 mL via OROMUCOSAL
  Filled 2023-08-25: qty 15

## 2023-08-25 MED ORDER — LIDOCAINE 2% (20 MG/ML) 5 ML SYRINGE
INTRAMUSCULAR | Status: DC | PRN
  Administered 2023-08-25: 60 mg via INTRAVENOUS

## 2023-08-25 MED ORDER — LIDOCAINE-EPINEPHRINE 1 %-1:100000 IJ SOLN
INTRAMUSCULAR | Status: DC | PRN
  Administered 2023-08-25: 5.5 mL

## 2023-08-25 MED ORDER — HYDROMORPHONE HCL 1 MG/ML IJ SOLN
0.2500 mg | INTRAMUSCULAR | Status: DC | PRN
  Administered 2023-08-25 (×3): 0.25 mg via INTRAVENOUS

## 2023-08-25 MED ORDER — ONDANSETRON HCL 4 MG/2ML IJ SOLN: 4.0000 mg | Freq: Once | INTRAMUSCULAR | Status: DC | PRN

## 2023-08-25 SURGICAL SUPPLY — 62 items
BLADE SURG 15 STRL LF DISP TIS (BLADE) ×3 IMPLANT
CANISTER SUCT 3000ML PPV (MISCELLANEOUS) ×1 IMPLANT
CORD BIPOLAR FORCEPS 12FT (ELECTRODE) ×1 IMPLANT
COVER PROBE W GEL 5X96 (DRAPES) ×1 IMPLANT
COVER SURGICAL LIGHT HANDLE (MISCELLANEOUS) ×1 IMPLANT
DERMABOND ADVANCED .7 DNX12 (GAUZE/BANDAGES/DRESSINGS) ×2 IMPLANT
DERMABOND ADVANCED .7 DNX6 (GAUZE/BANDAGES/DRESSINGS) IMPLANT
DRAPE C-ARM 35X43 STRL (DRAPES) ×1 IMPLANT
DRAPE HEAD BAR (DRAPES) ×1 IMPLANT
DRAPE INCISE IOBAN 66X45 STRL (DRAPES) ×1 IMPLANT
DRAPE MICROSCOPE LEICA 54X105 (DRAPES) ×1 IMPLANT
DRAPE UTILITY XL STRL (DRAPES) ×1 IMPLANT
DRSG TEGADERM 4X4.75 (GAUZE/BANDAGES/DRESSINGS) ×3 IMPLANT
ELECT COATED BLADE 2.86 ST (ELECTRODE) ×1 IMPLANT
ELECT EMG 18 NIMS (NEUROSURGERY SUPPLIES) ×1
ELECT REM PT RETURN 9FT ADLT (ELECTROSURGICAL) ×1
ELECTRODE EMG 18 NIMS (NEUROSURGERY SUPPLIES) ×1 IMPLANT
ELECTRODE REM PT RTRN 9FT ADLT (ELECTROSURGICAL) ×1 IMPLANT
FORCEPS BIPOLAR SPETZLER 8 1.0 (NEUROSURGERY SUPPLIES) ×1 IMPLANT
GAUZE 4X4 16PLY ~~LOC~~+RFID DBL (SPONGE) ×1 IMPLANT
GAUZE SPONGE 4X4 12PLY STRL (GAUZE/BANDAGES/DRESSINGS) ×1 IMPLANT
GAUZE SPONGE 4X4 12PLY STRL LF (GAUZE/BANDAGES/DRESSINGS) IMPLANT
GENERATOR PULSE INSPIRE (Generator) ×1 IMPLANT
GENERATOR PULSE INSPIRE IV (Generator) ×1 IMPLANT
GLOVE BIO SURGEON STRL SZ 6.5 (GLOVE) IMPLANT
GLOVE BIO SURGEON STRL SZ7.5 (GLOVE) ×1 IMPLANT
GOWN STRL REUS W/ TWL LRG LVL3 (GOWN DISPOSABLE) ×3 IMPLANT
KIT BASIN OR (CUSTOM PROCEDURE TRAY) ×1 IMPLANT
KIT NEURO ACCESSORY W/WRENCH (MISCELLANEOUS) IMPLANT
KIT TURNOVER KIT B (KITS) ×1 IMPLANT
LEAD SENSING RESP INSPIRE (Lead) ×1 IMPLANT
LEAD SENSING RESP INSPIRE IV (Lead) ×1 IMPLANT
LEAD SLEEP STIM INSPIRE IV/V (Lead) ×1 IMPLANT
LEAD SLEEP STIMULATION INSPIRE (Lead) ×1 IMPLANT
LOOP VASCLR MAXI BLUE 18IN ST (MISCELLANEOUS) ×1 IMPLANT
MARKER SKIN DUAL TIP RULER LAB (MISCELLANEOUS) ×2 IMPLANT
NDL HYPO 25GX1X1/2 BEV (NEEDLE) ×1 IMPLANT
NEEDLE HYPO 25GX1X1/2 BEV (NEEDLE) ×1
NS IRRIG 1000ML POUR BTL (IV SOLUTION) ×1 IMPLANT
PAD ARMBOARD 7.5X6 YLW CONV (MISCELLANEOUS) ×1 IMPLANT
PASSER CATH 38CM DISP (INSTRUMENTS) ×1 IMPLANT
PENCIL SMOKE EVACUATOR (MISCELLANEOUS) ×1 IMPLANT
POSITIONER HEAD DONUT 9IN (MISCELLANEOUS) ×1 IMPLANT
PROBE NERVE STIMULATOR (NEUROSURGERY SUPPLIES) ×1 IMPLANT
REMOTE CONTROL SLEEP INSPIRE (MISCELLANEOUS) ×1 IMPLANT
SET WALTER ACTIVATION W/DRAPE (SET/KITS/TRAYS/PACK) ×1 IMPLANT
SPONGE INTESTINAL PEANUT (DISPOSABLE) ×1 IMPLANT
STAPLER VISISTAT 35W (STAPLE) ×1 IMPLANT
SUT SILK 2 0 SH (SUTURE) ×1 IMPLANT
SUT SILK 3 0 REEL (SUTURE) ×1 IMPLANT
SUT SILK 3 0 SH 30 (SUTURE) ×2 IMPLANT
SUT SILK 3-0 RB1 30XBRD (SUTURE) ×1
SUT VIC AB 3-0 SH 27X BRD (SUTURE) ×2 IMPLANT
SUT VIC AB 4-0 PS2 27 (SUTURE) ×2 IMPLANT
SUTURE SILK 3-0 RB1 30XBRD (SUTURE) ×1 IMPLANT
SYR 10ML LL (SYRINGE) ×1 IMPLANT
TAPE CLOTH SURG 4X10 WHT LF (GAUZE/BANDAGES/DRESSINGS) ×1 IMPLANT
TIE VASCULAR MAXI BLUE 18IN ST (MISCELLANEOUS) ×1
TOWEL GREEN STERILE (TOWEL DISPOSABLE) ×1 IMPLANT
TRAY ENT MC OR (CUSTOM PROCEDURE TRAY) ×1 IMPLANT
VASCULAR TIE MAXI BLUE 18IN ST (MISCELLANEOUS) ×1
VASCULAR TIE MINI RED 18IN STL (MISCELLANEOUS) ×1 IMPLANT

## 2023-08-25 NOTE — H&P (Signed)
 Nathaniel Colon is an 63 y.o. male.   Chief Complaint: Sleep apnea HPI: 63 year old male with obstructive sleep apnea who has been unable to tolerate CPAP.  Past Medical History:  Diagnosis Date   Anginal pain (HCC)    Arthritis    Chronic kidney disease    Complication of anesthesia    patient reports hard to wake up after having EGD   COPD (chronic obstructive pulmonary disease) (HCC)    Coronary artery disease    Depression    Diabetes mellitus    Diabetic gastroparesis (HCC)    Dyspnea    GERD (gastroesophageal reflux disease)    H/O hiatal hernia    Hypertension    Neuromuscular disorder (HCC)    MUSCLE SPASMS   Peripheral vascular disease (HCC)    PERIPHERAL NEUROPATHY    Sleep apnea     Past Surgical History:  Procedure Laterality Date   ANTERIOR CERVICAL DECOMP/DISCECTOMY FUSION  08/30/2011   Procedure: ANTERIOR CERVICAL DECOMPRESSION/DISCECTOMY FUSION 2 LEVELS;  Surgeon: Oneil JAYSON Herald, MD;  [facility];  Service: Orthopedics;  Laterality: N/A;  C5-6, C6-7 Anterior Cervical Discectomy and Fusion, Allograft, Plate   CARDIAC CATHETERIZATION Left 05/01/2016   Procedure: Left Heart Cath and Coronary Angiography;  Surgeon: Wolm JINNY Rhyme, MD;  Location: [facility] INVASIVE CV LAB;  Service: Cardiovascular;  Laterality: Left;   CARDIAC CATHETERIZATION N/A 05/01/2016   Procedure: Coronary Stent Intervention;  Surgeon: Cara JONETTA Lovelace, MD;  Location: [facility] INVASIVE CV LAB;  Service: Cardiovascular;  Laterality: N/A;   DRUG INDUCED ENDOSCOPY N/A 07/29/2023   Procedure: DRUG INDUCED SLEEP ENDOSCOPY;  Surgeon: Carlie Clark, MD;  Location: [facility] OR;  Service: ENT;  Laterality: N/A;   LATERAL EPICONDYLE RELEASE  08/30/2011   Procedure: TENNIS ELBOW RELEASE;  Surgeon: Oneil JAYSON Herald, MD;  Location[facility];  Service: Orthopedics;  Laterality: Right;  Right Lateral Epicondyle Release, Drilling Repair    History reviewed. No pertinent family history. Social History:  reports that he quit  smoking about 7 years ago. His smoking use included cigarettes. He started smoking about 37 years ago. He has a 15 pack-year smoking history. He uses smokeless tobacco. He reports current alcohol  use. He reports that he does not use drugs.  Allergies: No Known Allergies  Medications Prior to Admission  Medication Sig Dispense Refill   albuterol  (VENTOLIN  HFA) 108 (90 Base) MCG/ACT inhaler Inhale 2 puffs into the lungs every 6 (six) hours as needed for wheezing or shortness of breath. 8 g 3   aluminum hydroxide-magnesium carbonate (ACID GONE) 95-358 MG/15ML SUSP Take 30 mLs by mouth 4 (four) times daily as needed for heartburn.     aspirin  EC 81 MG EC tablet Take 1 tablet (81 mg total) by mouth daily. 30 tablet 0   capsaicin (ZOSTRIX) 0.025 % cream Apply 1 Application topically 2 (two) times daily as needed (foot pain).     carboxymethylcellulose (REFRESH PLUS) 0.5 % SOLN Place 1-2 drops into both eyes as needed (irritation).     cefUROXime  (CEFTIN ) 500 MG tablet Take by mouth.     cholecalciferol  (VITAMIN D3) 25 MCG (1000 UNIT) tablet Take 1,000 Units by mouth daily.     diclofenac  Sodium (VOLTAREN ) 1 % GEL Apply 1 Application topically 2 (two) times daily as needed (back pain).     DULoxetine  (CYMBALTA ) 60 MG capsule Take 60 mg by mouth daily.     esomeprazole (NEXIUM) 40 MG capsule Take 40 mg by mouth 2 (two) times  daily before a meal.     famotidine (PEPCID) 40 MG tablet Take 40 mg by mouth at bedtime.     ferrous sulfate  325 (65 FE) MG tablet Take 1 tablet (325 mg total) by mouth 3 (three) times a week. Take on Monday, Wednesday, and friday 100 tablet 3   finasteride  (PROSCAR ) 5 MG tablet Take 1 tablet (5 mg total) by mouth daily. 30 tablet 2   Fluticasone -Umeclidin-Vilant (TRELEGY ELLIPTA ) 100-62.5-25 MCG/ACT AEPB Inhale 1 puff into the lungs in the morning. 1 each 0   gabapentin  (NEURONTIN ) 400 MG capsule Take 2 capsules (800 mg total) by mouth 2 (two) times daily. (Patient taking  differently: Take 400-800 mg by mouth See admin instructions. Take 400 mg twice daily and 800 mg at bedtime) 60 capsule 2   glucose 4 GM chewable tablet Chew 2 tablets by mouth as needed for low blood sugar.     Insulin  Aspart FlexPen (NOVOLOG ) 100 UNIT/ML Inject 35-50 Units into the skin in the morning, at noon, and at bedtime.     insulin  glargine-yfgn (SEMGLEE) 100 UNIT/ML injection Inject 40 Units into the skin 2 (two) times daily.     lidocaine  (LIDODERM ) 5 % Place 1 patch onto the skin daily as needed (pain). Apply to back     Melatonin 3 MG CAPS Take 6 mg by mouth at bedtime.     metoCLOPramide  (REGLAN ) 10 MG tablet Take 5 mg by mouth in the morning and at bedtime.     metoprolol  succinate (TOPROL -XL) 50 MG 24 hr tablet Take 0.5 tablets (25 mg total) by mouth daily. 30 tablet 2   rosuvastatin  (CRESTOR ) 40 MG tablet Take 20 mg by mouth daily.     senna-docusate (SENOKOT-S) 8.6-50 MG tablet Take 2 tablets by mouth at bedtime.     sitaGLIPtin (JANUVIA) 50 MG tablet Take 50 mg by mouth daily.     sucralfate (CARAFATE) 1 GM/10ML suspension Take 1 g by mouth 4 (four) times daily as needed (acid reflux).     tamsulosin  (FLOMAX ) 0.4 MG CAPS capsule Take 2 capsules (0.8 mg total) by mouth at bedtime. 60 capsule 2   vitamin B-12 (CYANOCOBALAMIN ) 500 MCG tablet Take 1,000 mcg by mouth daily.     amLODipine  (NORVASC ) 5 MG tablet Take 1 tablet (5 mg total) by mouth daily. 30 tablet 2   Glucagon, rDNA, (GLUCAGON EMERGENCY) 1 MG KIT Inject 1 mg into the vein as needed (low blood sugar).     nitroGLYCERIN  (NITROSTAT ) 0.4 MG SL tablet Place 0.4 mg under the tongue every 5 (five) minutes as needed for chest pain.      Results for orders placed or performed during the hospital encounter of 08/25/23 (from the past 48 hours)  Glucose, capillary     Status: Abnormal   Collection Time: 08/25/23  8:02 AM  Result Value Ref Range   Glucose-Capillary 170 (H) 70 - 99 mg/dL    Comment: Glucose reference range  applies only to samples taken after fasting for at least 8 hours.   Comment 1 Notify RN    No results found.  Review of Systems  All other systems reviewed and are negative.   Blood pressure (!) 164/99, pulse 76, temperature 97.7 F (36.5 C), resp. rate 18, height 6' 2 (1.88 m), weight 96.2 kg, SpO2 98%. Physical Exam Constitutional:      Appearance: Normal appearance.  HENT:     Head: Normocephalic and atraumatic.     Right Ear: External ear normal.  Left Ear: External ear normal.     Nose: Nose normal.     Mouth/Throat:     Mouth: Mucous membranes are moist.     Pharynx: Oropharynx is clear.  Eyes:     Extraocular Movements: Extraocular movements intact.     Conjunctiva/sclera: Conjunctivae normal.     Pupils: Pupils are equal, round, and reactive to light.  Cardiovascular:     Rate and Rhythm: Normal rate.  Pulmonary:     Effort: Pulmonary effort is normal.  Musculoskeletal:     Cervical back: Normal range of motion.  Skin:    General: Skin is warm and dry.  Neurological:     General: No focal deficit present.     Mental Status: He is alert and oriented to person, place, and time.  Psychiatric:        Mood and Affect: Mood normal.        Behavior: Behavior normal.        Thought Content: Thought content normal.        Judgment: Judgment normal.      Assessment/Plan Obstructive sleep apnea and BMI 27.22  To OR for hypoglossal nerve stimulator placement.  Vaughan Ricker, MD 08/25/2023, 10:29 AM

## 2023-08-25 NOTE — Brief Op Note (Signed)
 08/25/2023  12:32 PM  PATIENT:  Eveline JONELLE Munroe  63 y.o. male  PRE-OPERATIVE DIAGNOSIS:  Obstructive Sleep Apnea  POST-OPERATIVE DIAGNOSIS:  Obstructive Sleep Apnea  PROCEDURE:  Procedure(s): IMPLANTATION OF HYPOGLOSSAL NERVE STIMULATOR (N/A)  SURGEON:  Surgeons and Role:    DEWAINE Carlie Clark, MD - Primary  PHYSICIAN ASSISTANT: Benay  ASSISTANTS: none   ANESTHESIA:   general  EBL:  5 mL   BLOOD ADMINISTERED:none  DRAINS: none   LOCAL MEDICATIONS USED:  LIDOCAINE    SPECIMEN:  No Specimen  DISPOSITION OF SPECIMEN:  N/A  COUNTS:  YES  TOURNIQUET:  * No tourniquets in log *  DICTATION: .Note written in EPIC  PLAN OF CARE: Discharge to home after PACU  PATIENT DISPOSITION:  PACU - hemodynamically stable.   Delay start of Pharmacological VTE agent (>24hrs) due to surgical blood loss or risk of bleeding: no

## 2023-08-25 NOTE — Op Note (Signed)

## 2023-08-25 NOTE — Progress Notes (Signed)
 Patient appropriate for discharge. Placed in phase 2, awaiting chest x-ray results.

## 2023-08-25 NOTE — Anesthesia Procedure Notes (Signed)
 Procedure Name: Intubation Date/Time: 08/25/2023 10:53 AM  Performed by: Jerl Donald LABOR, CRNAPre-anesthesia Checklist: Patient identified, Emergency Drugs available, Suction available and Patient being monitored Patient Re-evaluated:Patient Re-evaluated prior to induction Oxygen Delivery Method: Circle System Utilized Preoxygenation: Pre-oxygenation with 100% oxygen Induction Type: IV induction Laryngoscope Size: Glidescope and 3 Grade View: Grade I Tube type: Oral Tube size: 7.5 mm Number of attempts: 1 Airway Equipment and Method: Rigid stylet and Video-laryngoscopy Placement Confirmation: ETT inserted through vocal cords under direct vision, positive ETCO2 and breath sounds checked- equal and bilateral Secured at: 25 cm Tube secured with: Tape Dental Injury: Teeth and Oropharynx as per pre-operative assessment

## 2023-08-25 NOTE — Progress Notes (Signed)
 Dr. Salvadore Farber made aware of patient's elevated BP readings this morning. 170/101 at 0805, 160/94 at 0814, and 171/104 at 0837. No new orders received.

## 2023-08-25 NOTE — Anesthesia Postprocedure Evaluation (Signed)
 Anesthesia Post Note  Patient: Nathaniel Colon  Procedure(s) Performed: IMPLANTATION OF HYPOGLOSSAL NERVE STIMULATOR (Neck)     Patient location during evaluation: PACU Anesthesia Type: General Level of consciousness: awake and alert, oriented and patient cooperative Pain management: pain level controlled Vital Signs Assessment: post-procedure vital signs reviewed and stable Respiratory status: spontaneous breathing, nonlabored ventilation and respiratory function stable Cardiovascular status: blood pressure returned to baseline and stable Postop Assessment: no apparent nausea or vomiting Anesthetic complications: no Comments: Hypertensive at baseline. On home oxygen 2LPM [place].   No notable events documented.  Last Vitals:  Vitals:   08/25/23 1330 08/25/23 1345  BP: (!) 182/106 (!) 189/105  Pulse: 80 82  Resp: 10 13  Temp:    SpO2: 94% 93%    Last Pain:  Vitals:   08/25/23 1315  PainSc: 6                  [address]

## 2023-08-25 NOTE — Transfer of Care (Signed)
 Immediate Anesthesia Transfer of Care Note  Patient: Nathaniel Colon  Procedure(s) Performed: IMPLANTATION OF HYPOGLOSSAL NERVE STIMULATOR (Neck)  Patient Location: PACU  Anesthesia Type:General  Level of Consciousness: awake and drowsy  Airway & Oxygen Therapy: Patient Spontanous Breathing and Patient connected to nasal cannula oxygen  Post-op Assessment: Report given to RN, Post -op Vital signs reviewed and stable, and Patient moving all extremities  Post vital signs: Reviewed and stable  Last Vitals:  Vitals Value Taken Time  BP 174/98 08/25/23 1251  Temp    Pulse 83 08/25/23 1254  Resp 11 08/25/23 1253  SpO2 93 % 08/25/23 1254  Vitals shown include unfiled device data.  Last Pain:  Vitals:   08/25/23 0814  PainSc: 0-No pain      Patients Stated Pain Goal: 0 (08/25/23 0814)  Complications: No notable events documented.

## 2023-08-26 ENCOUNTER — Encounter (HOSPITAL_COMMUNITY): Payer: Self-pay | Admitting: Otolaryngology

## 2023-09-17 ENCOUNTER — Encounter: Payer: Self-pay | Admitting: Pulmonary Disease

## 2023-09-17 ENCOUNTER — Ambulatory Visit: Payer: No Typology Code available for payment source | Admitting: Pulmonary Disease

## 2023-09-17 VITALS — BP 116/64 | HR 88 | Ht 74.0 in | Wt 214.4 lb

## 2023-09-17 DIAGNOSIS — J9611 Chronic respiratory failure with hypoxia: Secondary | ICD-10-CM

## 2023-09-17 DIAGNOSIS — R0609 Other forms of dyspnea: Secondary | ICD-10-CM

## 2023-09-17 MED ORDER — TRELEGY ELLIPTA 100-62.5-25 MCG/ACT IN AEPB: 1.0000 | INHALATION_SPRAY | Freq: Every morning | RESPIRATORY_TRACT | 11 refills | Status: DC

## 2023-09-17 NOTE — Patient Instructions (Signed)
 Continue Trelegy 1 puff once daily, rinse mouth after use  Continue albuterol  2 puffs every 4-6 hours as needed  Return to clinic in 1 year or sooner as needed

## 2023-09-17 NOTE — Progress Notes (Signed)
 @Patient  ID: Nathaniel Colon, male    DOB: [birth year], 63 y.o.   MRN: [id number]  Chief Complaint  Patient presents with   Follow-up    Referring provider: Rockey Denece LABOR, DO  HPI:   63 y.o. man whom we are seeing for evaluation of dyspnea on exertion and hospital follow-up for pneumonia and chronic hypoxemic respiratory failure here.  Most recent ENT note reviewed.   Doing fine.  Stable hypoxemia 2 L.  Symptoms of dyspnea pretty severe but unchanged, stable.  Feels like Trelegy is helpful.  Good adherence to this.  Using albuterol  couple times a week.  Does find it helpful.   HPI at initial visit: Patient reports shortness of breath on the last 6 to months.  Not much issue before that.  Worse on stairs or when hurrying.  No times a day with things are better or worse.  No position makes his better or worse.  No seasonal or environmental factors he can identify that make things better or worse.  No other alleviating or exacerbating factors.  Has not used inhalers.  He endorses a lot of issues with cough, gagging in the night.  Regurgitating, heartburn symptoms very bad.  Following with GI.  Recent endoscopy.  He has a hiatal hernia.  The symptoms seem to be improving with treatment.  He says when he vomits and used to vomit more, it looked like fecal matter.  His chest x-ray 06/2022 personally reviewed interpret as clear lungs bilaterally.  CTA PE protocol 06/2022 personally reviewed and interpreted as no PE, bronchial thickening that is diffuse, otherwise clear lungs.  PMH: Hypertension, CHF, diabetes, CKD, tobacco abuse in remission Surgical history: Neck surgery, PCI times 10/02/2015 Family history: Reviewed, no significant respiratory illness in first-degree relatives Social history: Former smoker, 1 pack a day for 30 years, quit a few years ago, lives in [place] / Pulmonary Flowsheets:   ACT:      No data to display          MMRC:     No data to  display          Epworth:      No data to display          Tests:   FENO:  No results found for: NITRICOXIDE  PFT:     No data to display          WALK:      No data to display          Imaging: Personally reviewed and as per EMR discussion this note DG Chest 1 View Result Date: 08/25/2023 CLINICAL DATA:  Obstructive sleep apnea. Postop implantation of hypoglossal nerve stimulator. EXAM: CHEST  1 VIEW COMPARISON:  Radiographs 04/15/2023 and 12/16/2022.  CT 06/18/2022. FINDINGS: 1300 hours. Lower lung volumes with resulting mild bibasilar atelectasis. No confluent airspace disease, significant pleural effusion or pneumothorax. The heart size and mediastinal contours are stable for the lower lung volumes. New generator pack overlies the right chest with a lead extending into the right submandibular region, consistent with interval placement of a hypoglossal nerve stimulator. There is a small amount of soft tissue emphysema in the right neck. Previous cervical fusion noted. No unexpected foreign bodies identified. IMPRESSION: 1. Interval placement of hypoglossal nerve stimulator with small amount of soft tissue emphysema in the right neck. No unexpected foreign bodies identified. 2. Lower lung volumes with resulting mild bibasilar atelectasis. No evidence of pneumothorax. Electronically Signed  By: Elsie Perone M.D.   On: 08/25/2023 14:36   DG Neck Soft Tissue Result Date: 08/25/2023 CLINICAL DATA:  Implantation of hypoglossal nerve stimulator. EXAM: NECK SOFT TISSUES - 1+ VIEW COMPARISON:  None Available. FINDINGS: Nerve stimulator with battery pack over the right chest wall and tip in the somewhat tubular region. Postsurgical changes of the soft tissues of the neck. Lower cervical ACDF. IMPRESSION: Hypoglossal stimulator. Electronically Signed   By: Vanetta Chou M.D.   On: 08/25/2023 14:34    Lab Results: Personally reviewed CBC    Component Value Date/Time    WBC 8.2 07/29/2023 1345   RBC 5.66 07/29/2023 1345   HGB 16.3 07/29/2023 1457   HCT 48.0 07/29/2023 1457   PLT 241 07/29/2023 1345   MCV 89.6 07/29/2023 1345   MCH 28.8 07/29/2023 1345   MCHC 32.1 07/29/2023 1345   RDW 14.3 07/29/2023 1345    BMET    Component Value Date/Time   NA 135 07/29/2023 1457   K 4.2 07/29/2023 1457   CL 98 07/29/2023 1457   CO2 25 04/17/2023 0823   GLUCOSE 367 (H) 07/29/2023 1457   BUN 21 07/29/2023 1457   CREATININE 1.60 (H) 07/29/2023 1457   CALCIUM  9.0 04/17/2023 0823   GFRNONAA 48 (L) 04/17/2023 0823   GFRAA >60 03/15/2016 1253    BNP    Component Value Date/Time   BNP 19.0 06/18/2022 1203    ProBNP No results found for: PROBNP  Specialty Problems       Pulmonary Problems   Dyspnea   Chronic hypoxic respiratory failure (HCC)    No Known Allergies  Immunization History  Administered Date(s) Administered   Influenza,inj,Quad PF,6+ Mos 06/21/2021, 07/15/2022   Moderna Sars-Covid-2 Vaccination 11/18/2019, 12/16/2019   PNEUMOCOCCAL CONJUGATE-20 06/21/2021   Tdap 08/13/2015   Zoster Recombinant(Shingrix) 04/28/2020, 10/25/2020    Past Medical History:  Diagnosis Date   Anginal pain (HCC)    Arthritis    Chronic kidney disease    Complication of anesthesia    patient reports hard to wake up after having EGD   COPD (chronic obstructive pulmonary disease) (HCC)    Coronary artery disease    Depression    Diabetes mellitus    Diabetic gastroparesis (HCC)    Dyspnea    GERD (gastroesophageal reflux disease)    H/O hiatal hernia    Hypertension    Neuromuscular disorder (HCC)    MUSCLE SPASMS   Peripheral vascular disease (HCC)    PERIPHERAL NEUROPATHY    Sleep apnea     Tobacco History: Social History   Tobacco Use  Smoking Status Former   Current packs/day: 0.00   Average packs/day: 0.5 packs/day for 30.0 years (15.0 ttl pk-yrs)   Types: Cigarettes   Start date: 19   Quit date: 2018   Years since quitting:  7.1  Smokeless Tobacco Never   Counseling given: Not Answered   Continue to not smoke  Outpatient Encounter Medications as of 09/17/2023  Medication Sig   albuterol  (VENTOLIN  HFA) 108 (90 Base) MCG/ACT inhaler Inhale 2 puffs into the lungs every 6 (six) hours as needed for wheezing or shortness of breath.   aluminum hydroxide-magnesium carbonate (ACID GONE) 95-358 MG/15ML SUSP Take 30 mLs by mouth 4 (four) times daily as needed for heartburn.   aspirin  EC 81 MG EC tablet Take 1 tablet (81 mg total) by mouth daily.   capsaicin (ZOSTRIX) 0.025 % cream Apply 1 Application topically 2 (two) times daily as needed (foot  pain).   carboxymethylcellulose (REFRESH PLUS) 0.5 % SOLN Place 1-2 drops into both eyes as needed (irritation).   cefUROXime  (CEFTIN ) 500 MG tablet Take by mouth.   cholecalciferol  (VITAMIN D3) 25 MCG (1000 UNIT) tablet Take 1,000 Units by mouth daily.   diclofenac  Sodium (VOLTAREN ) 1 % GEL Apply 1 Application topically 2 (two) times daily as needed (back pain).   DULoxetine  (CYMBALTA ) 60 MG capsule Take 60 mg by mouth daily.   esomeprazole (NEXIUM) 40 MG capsule Take 40 mg by mouth 2 (two) times daily before a meal.   famotidine (PEPCID) 40 MG tablet Take 40 mg by mouth at bedtime.   ferrous sulfate  325 (65 FE) MG tablet Take 1 tablet (325 mg total) by mouth 3 (three) times a week. Take on Monday, Wednesday, and friday   finasteride  (PROSCAR ) 5 MG tablet Take 1 tablet (5 mg total) by mouth daily.   gabapentin  (NEURONTIN ) 400 MG capsule Take 2 capsules (800 mg total) by mouth 2 (two) times daily. (Patient taking differently: Take 400-800 mg by mouth See admin instructions. Take 400 mg twice daily and 800 mg at bedtime)   Glucagon, rDNA, (GLUCAGON EMERGENCY) 1 MG KIT Inject 1 mg into the vein as needed (low blood sugar).   glucose 4 GM chewable tablet Chew 2 tablets by mouth as needed for low blood sugar.   HYDROcodone -acetaminophen  (NORCO/VICODIN) 5-325 MG tablet Take 1-2 tablets  by mouth every 6 (six) hours as needed for moderate pain (pain score 4-6).   Insulin  Aspart FlexPen (NOVOLOG ) 100 UNIT/ML Inject 35-50 Units into the skin in the morning, at noon, and at bedtime.   insulin  glargine-yfgn (SEMGLEE) 100 UNIT/ML injection Inject 40 Units into the skin 2 (two) times daily.   lidocaine  (LIDODERM ) 5 % Place 1 patch onto the skin daily as needed (pain). Apply to back   Melatonin 3 MG CAPS Take 6 mg by mouth at bedtime.   metoCLOPramide  (REGLAN ) 10 MG tablet Take 5 mg by mouth in the morning and at bedtime.   metoprolol  succinate (TOPROL -XL) 50 MG 24 hr tablet Take 0.5 tablets (25 mg total) by mouth daily.   nitroGLYCERIN  (NITROSTAT ) 0.4 MG SL tablet Place 0.4 mg under the tongue every 5 (five) minutes as needed for chest pain.   rosuvastatin  (CRESTOR ) 40 MG tablet Take 20 mg by mouth daily.   senna-docusate (SENOKOT-S) 8.6-50 MG tablet Take 2 tablets by mouth at bedtime.   sitaGLIPtin (JANUVIA) 50 MG tablet Take 50 mg by mouth daily.   sucralfate (CARAFATE) 1 GM/10ML suspension Take 1 g by mouth 4 (four) times daily as needed (acid reflux).   tamsulosin  (FLOMAX ) 0.4 MG CAPS capsule Take 2 capsules (0.8 mg total) by mouth at bedtime.   vitamin B-12 (CYANOCOBALAMIN ) 500 MCG tablet Take 1,000 mcg by mouth daily.   [DISCONTINUED] Fluticasone -Umeclidin-Vilant (TRELEGY ELLIPTA ) 100-62.5-25 MCG/ACT AEPB Inhale 1 puff into the lungs in the morning.   amLODipine  (NORVASC ) 5 MG tablet Take 1 tablet (5 mg total) by mouth daily.   Fluticasone -Umeclidin-Vilant (TRELEGY ELLIPTA ) 100-62.5-25 MCG/ACT AEPB Inhale 1 puff into the lungs in the morning.   No facility-administered encounter medications on file as of 09/17/2023.     Review of Systems  Review of Systems  N/a Physical Exam  BP 116/64 (BP Location: Right Arm, Patient Position: Sitting, Cuff Size: Normal)   Pulse 88   Ht 6' 2 (1.88 m)   Wt 214 lb 6.4 oz (97.3 kg)   SpO2 96%   BMI 27.53 kg/m  Wt Readings from Last  5 Encounters:  09/17/23 214 lb 6.4 oz (97.3 kg)  08/25/23 212 lb (96.2 kg)  04/17/23 209 lb 3.5 oz (94.9 kg)  12/23/22 212 lb 6.4 oz (96.3 kg)  12/16/22 211 lb (95.7 kg)    BMI Readings from Last 5 Encounters:  09/17/23 27.53 kg/m  08/25/23 27.22 kg/m  04/17/23 26.86 kg/m  12/23/22 27.27 kg/m  12/16/22 27.09 kg/m     Physical Exam General: Sitting in chair, no acute distress Eyes: EOMI, no icterus Neck: Supple, no JVP appreciated sitting upright Pulmonary: Clear, distant, voice seems weak or strained at times Cardiovascular: Warm, no edema Abdomen: Nondistended, bowel sounds present MSK: No synovitis, no joint effusion Neuro: Ambulates with assistance of rollator Psych: Normal mood, full affect   Assessment & Plan:   Dyspnea on exertion: Present for the about a year.  On further questioning likely longer, really thinks things started with anterior approach to cervical spine surgery which was in 2013.  Suspect multifactorial.  Likely chronic bronchitis in the setting of 30-pack-year smoking history.  He has thickened bronchioles on CT scan.  Also, he clearly endorses what sounds like aspiration events of gastro esophageal reflux at night with coughing and choking gagging.  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  Trial Stiolto without improvement.  PFTs for further evaluation could not be completed as patient had to stop test once nose clip was placed.  With recent hospitalization, was escalated to Trelegy.  He finds this helps somewhat.  Reviewed cardiology evaluation which showed no ischemia on perfusion scan, normal EF on TTE despite decreased EF on perfusion scan.  No further workup recommended from their standpoint.  Trelegy refilled, continue albuterol  as needed.  Chronic hypoxemic respite failure: Continue 2 L, oxygen saturation is adequate.   Return in about 1 year (around 09/16/2024) for f/u Dr. Annella.   Donnice JONELLE Annella, MD 09/17/2023

## 2023-10-05 NOTE — Progress Notes (Unsigned)
 @Patient  ID: Nathaniel Colon, male    DOB: [birth year], 63 y.o.   MRN: [id number]  No chief complaint on file.   Referring provider: Administration, [facility]  HPI: 63 y.o. man whom we are seeing for evaluation of dyspnea on exertion.    Overall, no change in symptoms.  Was prescribed Stiolto at last visit.  He does not think it helped.  PFTs were ordered for further evaluation.  Scheduled for today.  He cannot complete them.  Did not attempt.  Suspect he felt claustrophobic he has a history of the same.  His chief complaint is dry mouth.  Feels he cannot open his mouth as the force of his lips at time.  He thinks SMA raising short of breath but he cannot open his mouth to breathe.  Discussed referral to rheumatology given dry mouth, I am not well equipped to further evaluate or treat this.  In addition, given shortness of breath and significant coronary disease last seen by cardiology in 2017 following cath, I think is very important he meet with a cardiologist for further evaluation of his shortness of breath but also for ongoing medication management for coronary artery disease.   HPI at initial visit: Patient reports shortness of breath on the last 6 to months.  Not much issue before that.  Worse on stairs or when hurrying.  No times a day with things are better or worse.  No position makes his better or worse.  No seasonal or environmental factors he can identify that make things better or worse.  No other alleviating or exacerbating factors.  Has not used inhalers.  He endorses a lot of issues with cough, gagging in the night.  Regurgitating, heartburn symptoms very bad.  Following with GI.  Recent endoscopy.  He has a hiatal hernia.  The symptoms seem to be improving with treatment.  He says when he vomits and used to vomit more, it looked like fecal matter.  His chest x-ray 06/2022 personally reviewed interpret as clear lungs bilaterally.  CTA PE protocol 06/2022 personally reviewed and  interpreted as no PE, bronchial thickening that is diffuse, otherwise clear lungs.  PMH: Hypertension, CHF, diabetes, CKD, tobacco abuse in remission Surgical history: Neck surgery, PCI times 10/02/2015 Family history: Reviewed, no significant respiratory illness in first-degree relatives Social history: Former smoker, 1 pack a day for 30 years, quit a few years ago, lives in [place]    Dyspnea on exertion: Present for the about a year.  On further questioning likely longer, really thinks things started with anterior approach to cervical spine surgery which was in 2013.  Suspect multifactorial.  Likely chronic bronchitis in the setting of 30-pack-year smoking history.  He has thickened bronchioles on CT scan.  Also, he clearly endorses what sounds like aspiration events of gastro esophageal reflux at night with coughing and choking gagging.  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  Trial Stiolto without improvement.  PFTs for further evaluation could not be completed as patient had to stop test once nose clip was placed.  Given lack of improvement with inhaler therapy, okay to stop.  Will try albuterol to see if something different helps.  Can use as needed, instructions given.  Given lack of improvement inhaler therapy, referral to cardiology for evaluation of dyspnea on exertion.   Coronary artery disease: Refer to cardiology.  Stent placed in 2017.  Has not followed up with them.   Dry mouth: Unclear etiology.  Referral to rheumatology for evaluation  of Sjogren's etc.     Return in about 3 months (around 12/18/2022).  12/16/2022 Patient presents today for hospital follow-up.  Past medical history significant for heart failure with preserved ejection fraction, coronary artery status post stenting, type 2 diabetes insulin-dependent, hypertension, hyperlipidemia, OSA, chronic kidney disease. Patient follows with Dr. Judeth Horn for dyspnea symptoms, he was last seen on 09/19/22.  He  was unable to complete PFTs.  Sample of Stiolto did not help his breathing much.  He was recently admitted to [facility].  He originally presented to urgent care on 11/13/2022.  He was found to be in acute hypoxic respiratory failure secondary to pneumonia treated with antibiotics and supplemental oxygen.  He also had acute kidney injury that improved with fluids.  CT of his head was negative for acute pathology, CT abdomen pelvis was also negative.  MRI of spine showed degenerative changes but no focal lesion on spinal cord.  Patient was noted to have ataxic gait.  Needing assistant with mobility and activities of daily living, following with PT and OT.  He follows with neurology in [facility] for tremors.  Patient has noticed increased weakness over the last few months, no longer working.  He was on modified job duties and then workplace shutdown in December.  Fatigues easily.  Reports frequent falls at home due to imbalance.   Patient experiences chest tightness, dyspnea symptoms and intermittent cough. Associated PND. He has dry mouth all the time. Using flonase and saline on occasional. He gets short winded when walking 20-30 ft. He is currently at short stay rehab through the [place]. Scheduled for open MRI brain on 01/03/2023. He has trouble swallowing at times. Following with speech therapy due to aspiration pneumonia. Patient has severe sleep apnea but unable to tolerate CPAP due to claustrophobia.   10/06/2023 -Interim hx  Patient presents today for Inspire activation. He was last seen by our office in May by Dr. Judeth Horn for dyspnea symptoms. Hx sleep apnea, intolerant to CPAP. Uses continuous O2 2L/min 24 hours.   > Sleep study done through the [place], AHI 71.5  > Intolerant to CPAP  > Hypoglossal nerve stimulator (Inspire) implant date 08/25/23 with Dr. Jenne Pane    Discussed the use of AI scribe software for clinical note transcription with the patient, who gave verbal consent to  proceed.  History of Present Illness   Nathaniel Colon is a 63 year old male who presents for activation of the Inspire device following implantation for sleep apnea.  The Inspire device was implanted in January after a sleep study conducted several months prior through the [place]. He had previously attempted CPAP therapy but was unable to tolerate it. Since the implantation, he has not experienced any issues, including trouble swallowing or pain at the site.  He uses supplemental oxygen at a rate of two liters per minute while at rest. His current oxygen tank lasts about an hour.  Regarding his sleep, he typically goes to bed between 10 and 11 PM, taking melatonin around 8 to 9 PM to aid in falling asleep, which usually takes about an hour. He wakes up multiple times during the night to urinate, sometimes as frequently as twenty times, affecting his ability to return to sleep quickly.      Sensation level - 0.9 Functional level- 0.7 V (lower limit 0.7 - upper limit 1.7) Start delay- 60 mins Pause delay - 20 mins Sleep duration-  9 hours  No Known Allergies  Immunization History  Administered Date(s)  Administered   Influenza,inj,Quad PF,6+ Mos 06/21/2021, 07/15/2022   Moderna Sars-Covid-2 Vaccination 11/18/2019, 12/16/2019   PNEUMOCOCCAL CONJUGATE-20 06/21/2021   Tdap 08/13/2015   Zoster Recombinant(Shingrix) 04/28/2020, 10/25/2020    Past Medical History:  Diagnosis Date   Anginal pain (HCC)    Arthritis    Chronic kidney disease    Complication of anesthesia    patient reports "hard to wake up after having EGD   COPD (chronic obstructive pulmonary disease) (HCC)    Coronary artery disease    Depression    Diabetes mellitus    Diabetic gastroparesis (HCC)    Dyspnea    GERD (gastroesophageal reflux disease)    H/O hiatal hernia    Hypertension    Neuromuscular disorder (HCC)    MUSCLE SPASMS   Peripheral vascular disease (HCC)    PERIPHERAL NEUROPATHY    Sleep apnea      Tobacco History: Social History   Tobacco Use  Smoking Status Former   Current packs/day: 0.00   Average packs/day: 0.5 packs/day for 30.0 years (15.0 ttl pk-yrs)   Types: Cigarettes   Start date: 38   Quit date: 2018   Years since quitting: 7.1  Smokeless Tobacco Never   Counseling given: Not Answered   Outpatient Medications Prior to Visit  Medication Sig Dispense Refill   albuterol (VENTOLIN HFA) 108 (90 Base) MCG/ACT inhaler Inhale 2 puffs into the lungs every 6 (six) hours as needed for wheezing or shortness of breath. 8 g 3   aluminum hydroxide-magnesium carbonate (ACID GONE) 95-358 MG/15ML SUSP Take 30 mLs by mouth 4 (four) times daily as needed for heartburn.     amLODipine (NORVASC) 5 MG tablet Take 1 tablet (5 mg total) by mouth daily. 30 tablet 2   aspirin EC 81 MG EC tablet Take 1 tablet (81 mg total) by mouth daily. 30 tablet 0   capsaicin (ZOSTRIX) 0.025 % cream Apply 1 Application topically 2 (two) times daily as needed (foot pain).     carboxymethylcellulose (REFRESH PLUS) 0.5 % SOLN Place 1-2 drops into both eyes as needed (irritation).     cefUROXime (CEFTIN) 500 MG tablet Take by mouth.     cholecalciferol (VITAMIN D3) 25 MCG (1000 UNIT) tablet Take 1,000 Units by mouth daily.     diclofenac Sodium (VOLTAREN) 1 % GEL Apply 1 Application topically 2 (two) times daily as needed (back pain).     DULoxetine (CYMBALTA) 60 MG capsule Take 60 mg by mouth daily.     esomeprazole (NEXIUM) 40 MG capsule Take 40 mg by mouth 2 (two) times daily before a meal.     famotidine (PEPCID) 40 MG tablet Take 40 mg by mouth at bedtime.     ferrous sulfate 325 (65 FE) MG tablet Take 1 tablet (325 mg total) by mouth 3 (three) times a week. Take on Monday, Wednesday, and friday 100 tablet 3   finasteride (PROSCAR) 5 MG tablet Take 1 tablet (5 mg total) by mouth daily. 30 tablet 2   Fluticasone-Umeclidin-Vilant (TRELEGY ELLIPTA) 100-62.5-25 MCG/ACT AEPB Inhale 1 puff into the lungs  in the morning. 60 each 11   gabapentin (NEURONTIN) 400 MG capsule Take 2 capsules (800 mg total) by mouth 2 (two) times daily. (Patient taking differently: Take 400-800 mg by mouth See admin instructions. Take 400 mg twice daily and 800 mg at bedtime) 60 capsule 2   Glucagon, rDNA, (GLUCAGON EMERGENCY) 1 MG KIT Inject 1 mg into the vein as needed (low blood sugar).  glucose 4 GM chewable tablet Chew 2 tablets by mouth as needed for low blood sugar.     HYDROcodone-acetaminophen (NORCO/VICODIN) 5-325 MG tablet Take 1-2 tablets by mouth every 6 (six) hours as needed for moderate pain (pain score 4-6). 12 tablet 0   Insulin Aspart FlexPen (NOVOLOG) 100 UNIT/ML Inject 35-50 Units into the skin in the morning, at noon, and at bedtime.     insulin glargine-yfgn (SEMGLEE) 100 UNIT/ML injection Inject 40 Units into the skin 2 (two) times daily.     lidocaine (LIDODERM) 5 % Place 1 patch onto the skin daily as needed (pain). Apply to back     Melatonin 3 MG CAPS Take 6 mg by mouth at bedtime.     metoCLOPramide (REGLAN) 10 MG tablet Take 5 mg by mouth in the morning and at bedtime.     metoprolol succinate (TOPROL-XL) 50 MG 24 hr tablet Take 0.5 tablets (25 mg total) by mouth daily. 30 tablet 2   nitroGLYCERIN (NITROSTAT) 0.4 MG SL tablet Place 0.4 mg under the tongue every 5 (five) minutes as needed for chest pain.     rosuvastatin (CRESTOR) 40 MG tablet Take 20 mg by mouth daily.     senna-docusate (SENOKOT-S) 8.6-50 MG tablet Take 2 tablets by mouth at bedtime.     sitaGLIPtin (JANUVIA) 50 MG tablet Take 50 mg by mouth daily.     sucralfate (CARAFATE) 1 GM/10ML suspension Take 1 g by mouth 4 (four) times daily as needed (acid reflux).     tamsulosin (FLOMAX) 0.4 MG CAPS capsule Take 2 capsules (0.8 mg total) by mouth at bedtime. 60 capsule 2   vitamin B-12 (CYANOCOBALAMIN) 500 MCG tablet Take 1,000 mcg by mouth daily.     No facility-administered medications prior to visit.   Review of  Systems  Review of Systems  Constitutional: Negative.   HENT: Negative.    Respiratory: Negative.    Cardiovascular: Negative.    Physical Exam  There were no vitals taken for this visit. Physical Exam Constitutional:      General: He is not in acute distress.    Appearance: Normal appearance. He is not ill-appearing.  HENT:     Head: Normocephalic and atraumatic.     Mouth/Throat:     Mouth: Mucous membranes are moist.     Pharynx: Oropharynx is clear.  Neck:     Comments: Right neck and chest wall incision s/p inspire device implantation without erythema or swelling  Cardiovascular:     Rate and Rhythm: Normal rate.  Pulmonary:     Effort: Pulmonary effort is normal.     Comments: Wearing 2L oxygen  Musculoskeletal:     Cervical back: Normal range of motion and neck supple.  Skin:    General: Skin is warm and dry.  Neurological:     General: No focal deficit present.     Mental Status: He is alert and oriented to person, place, and time. Mental status is at baseline.  Psychiatric:        Mood and Affect: Mood normal.        Behavior: Behavior normal.        Thought Content: Thought content normal.        Judgment: Judgment normal.      Lab Results:  CBC    Component Value Date/Time   WBC 8.2 07/29/2023 1345   RBC 5.66 07/29/2023 1345   HGB 16.3 07/29/2023 1457   HCT 48.0 07/29/2023 1457   PLT  241 07/29/2023 1345   MCV 89.6 07/29/2023 1345   MCH 28.8 07/29/2023 1345   MCHC 32.1 07/29/2023 1345   RDW 14.3 07/29/2023 1345    BMET    Component Value Date/Time   NA 135 07/29/2023 1457   K 4.2 07/29/2023 1457   CL 98 07/29/2023 1457   CO2 25 04/17/2023 0823   GLUCOSE 367 (H) 07/29/2023 1457   BUN 21 07/29/2023 1457   CREATININE 1.60 (H) 07/29/2023 1457   CALCIUM 9.0 04/17/2023 0823   GFRNONAA 48 (L) 04/17/2023 0823   GFRAA >60 03/15/2016 1253    BNP    Component Value Date/Time   BNP 19.0 06/18/2022 1203    ProBNP No results found for:  "PROBNP"  Imaging: No results found.   Assessment & Plan:   No problem-specific Assessment & Plan notes found for this encounter.   Assessment and Plan    Obstructive Sleep Apnea Inspire device implanted in January 13th, 2025. No post-operative complications reported. Patient previously unable to tolerate CPAP. Presents today for Inspire activation. He has no significant swelling to right neck or chest wall incision s/p implantation. Tongue midline without deviation. No weakness in tongue or lower lip. No issues swallowing. Patient had no discomfort with activation today. Reviewed and educated patient on sleep remote.  Patient was able to demonstrate competency with the remote and is aware of the patient app instructional videos and sleep quick guide.  Patient was instructed to use therapy every night for minimum 4 to 6 hours.  Patient's current stimulation level is _0.7___ V (level 1). Patient will step up one level (0.1V) every week (__Mondays__). Patient will be scheduled for check-in visit in 6 weeks to ensure they are using therapy every night, evaluate tolerance and subjective benefit.  Patient will be scheduled for titration sleep study 3 months after activation to assess adherence, fine-tune stimulation settings and evaluate efficacy.  -Activated Inspire device and educated patient on use, including adjusting stimulation levels weekly. -Plan to follow up in one month to assess patient's comfort and potential benefits.  Sensation level - 0.9 Functional level- 0.7 V (lower limit 0.7 - upper limit 1.7) Start delay- 60 mins Pause delay - 20 mins Sleep duration-  9 hours  Chronic Hypoxia Patient on 2L oxygen via tank, even at rest. -Consider evaluation for a portable oxygen concentrator. -Will discuss further at a follow-up visit or with Dr. Babs Bertin.  General Health Maintenance -Encouraged patient to use Inspire device consistently, even during daytime naps. -Advised patient to  monitor device usage and report any issues to the clinic or the 24/7 patient services number. -Plan to reassess device settings and patient's comfort at next visit.      40 mins spent on case: > 50% face to face with patient  Glenford Bayley, NP 10/05/2023

## 2023-10-06 ENCOUNTER — Ambulatory Visit (INDEPENDENT_AMBULATORY_CARE_PROVIDER_SITE_OTHER): Payer: No Typology Code available for payment source | Admitting: Primary Care

## 2023-10-06 ENCOUNTER — Encounter: Payer: Self-pay | Admitting: Primary Care

## 2023-10-06 VITALS — BP 129/82 | HR 89 | Ht 74.0 in | Wt 219.4 lb

## 2023-10-06 DIAGNOSIS — J9611 Chronic respiratory failure with hypoxia: Secondary | ICD-10-CM | POA: Diagnosis not present

## 2023-10-06 NOTE — Patient Instructions (Addendum)
 Increase Inspire 1 level once a week (on Monday's). You have 10 levels. Do not increase faster than directed. If uncomfortable you can lower level and re-try a few days later  Continue to wear oxygen 2L  -- Start delay is 60 mins -- Pause delay is 20 mins -- Sleep duration is 9 hours  Orders: Order place for best fit evaluation with [employer] for portable oxygen concentrator   Follow-up:  6 weeks inspire check in with [facility] NP

## 2023-11-13 ENCOUNTER — Encounter: Payer: Self-pay | Admitting: Primary Care

## 2023-11-13 ENCOUNTER — Ambulatory Visit: Payer: No Typology Code available for payment source | Admitting: Primary Care

## 2023-11-18 ENCOUNTER — Encounter: Payer: Self-pay | Admitting: Primary Care

## 2023-12-01 ENCOUNTER — Telehealth: Payer: Self-pay | Admitting: Primary Care

## 2023-12-01 NOTE — Telephone Encounter (Signed)
 VA authorization sent to [facility] via email for additional visits.

## 2023-12-01 NOTE — Telephone Encounter (Signed)
 Copied from CRM [id number]. Topic: Referral - Status >> Nov 25, 2023 11:56 AM Roseanne Cones wrote: Reason for CRM: Patient missed a few appointments with the office regarding Inspire activation follow up  and called to reschedule - referral is attached to the rescheduled appointment on 04/28 with Dr. Marygrace Snellen - it appears that the referrals has 4 visits left and is set to expire in the middle of May. Sending this CRM per our protocol when making VA patient appointments and in the event that a new auth or referral needs to be obtained for the patient for future follow ups.

## 2023-12-08 ENCOUNTER — Ambulatory Visit: Admitting: Pulmonary Disease

## 2023-12-09 ENCOUNTER — Emergency Department (HOSPITAL_COMMUNITY)

## 2023-12-09 ENCOUNTER — Emergency Department (HOSPITAL_COMMUNITY)
Admission: EM | Admit: 2023-12-09 | Discharge: 2023-12-09 | Disposition: A | Discharge status: Home / Self Care | Attending: Emergency Medicine | Admitting: Emergency Medicine

## 2023-12-09 ENCOUNTER — Other Ambulatory Visit: Payer: Self-pay

## 2023-12-09 DIAGNOSIS — R0781 Pleurodynia: Secondary | ICD-10-CM | POA: Diagnosis present

## 2023-12-09 DIAGNOSIS — Z794 Long term (current) use of insulin: Secondary | ICD-10-CM | POA: Insufficient documentation

## 2023-12-09 DIAGNOSIS — Z7982 Long term (current) use of aspirin: Secondary | ICD-10-CM | POA: Diagnosis not present

## 2023-12-09 DIAGNOSIS — W1839XA Other fall on same level, initial encounter: Secondary | ICD-10-CM | POA: Insufficient documentation

## 2023-12-09 MED ORDER — MORPHINE SULFATE 15 MG PO TABS
7.5000 mg | ORAL_TABLET | ORAL | 0 refills | Status: DC | PRN
Start: 2023-12-09 — End: 2024-06-30

## 2023-12-09 MED ORDER — ACETAMINOPHEN 500 MG PO TABS
1000.0000 mg | ORAL_TABLET | Freq: Once | ORAL | Status: AC
  Administered 2023-12-09: 1000 mg via ORAL
  Filled 2023-12-09: qty 2

## 2023-12-09 MED ORDER — KETOROLAC TROMETHAMINE 15 MG/ML IJ SOLN
15.0000 mg | Freq: Once | INTRAMUSCULAR | Status: AC
  Administered 2023-12-09: 15 mg via INTRAMUSCULAR
  Filled 2023-12-09: qty 1

## 2023-12-09 MED ORDER — OXYCODONE HCL 5 MG PO TABS
5.0000 mg | ORAL_TABLET | Freq: Once | ORAL | Status: AC
  Administered 2023-12-09: 5 mg via ORAL
  Filled 2023-12-09: qty 1

## 2023-12-09 NOTE — ED Provider Notes (Signed)
 [place] EMERGENCY DEPARTMENT AT [facility] Provider Note   CSN: [id number] Arrival date & time: 12/09/23  1031     History  Chief Complaint  Patient presents with   Fall   Rib Injury    Nathaniel Colon is a 63 y.o. male.  63 yo M with a chief complaints of fall.  The patient states that he falls somewhat frequently.  He is unsteady at baseline and has to walk with a walker and sometimes when he does not feel well he uses a wheelchair.  He said that he was trying to get up to go to the bathroom about 3 AM and he lost his balance and fell.  He got back into bed and when he woke up this morning he realized that he was having some left-sided chest discomfort.  He denies any other obvious injury.  He is on oxygen at baseline and does not feel like he is having any worsening trouble breathing.  Does feel like it hurts to take a big deep breath.   Fall       Home Medications Prior to Admission medications   Medication Sig Start Date End Date Taking? Authorizing Provider  morphine  (MSIR) 15 MG tablet Take 0.5 tablets (7.5 mg total) by mouth every 4 (four) hours as needed for severe pain (pain score 7-10). 12/09/23  Yes Albertus Hughs, DO  albuterol  (VENTOLIN  HFA) 108 (90 Base) MCG/ACT inhaler Inhale 2 puffs into the lungs every 6 (six) hours as needed for wheezing or shortness of breath. 09/19/22   Hunsucker, Archer Kobs, MD  aluminum hydroxide-magnesium carbonate (ACID GONE) 95-358 MG/15ML SUSP Take 30 mLs by mouth 4 (four) times daily as needed for heartburn.    [provider]  amLODipine  (NORVASC ) 5 MG tablet Take 1 tablet (5 mg total) by mouth daily. 04/17/23 07/29/23  Pokhrel, Amador Bad, MD  aspirin  EC 81 MG EC tablet Take 1 tablet (81 mg total) by mouth daily. 03/16/16   Patel, Sona, MD  capsaicin (ZOSTRIX) 0.025 % cream Apply 1 Application topically 2 (two) times daily as needed (foot pain).    [provider]  carboxymethylcellulose (REFRESH PLUS) 0.5 % SOLN Place  1-2 drops into both eyes as needed (irritation).    [provider]  cefUROXime (CEFTIN) 500 MG tablet Take by mouth. 08/07/23   [provider]  cholecalciferol  (VITAMIN D3) 25 MCG (1000 UNIT) tablet Take 1,000 Units by mouth daily.    [provider]  diclofenac  Sodium (VOLTAREN ) 1 % GEL Apply 1 Application topically 2 (two) times daily as needed (back pain).    [provider]  DULoxetine  (CYMBALTA ) 60 MG capsule Take 60 mg by mouth daily.    [provider]  esomeprazole (NEXIUM) 40 MG capsule Take 40 mg by mouth 2 (two) times daily before a meal.    [provider]  famotidine (PEPCID) 40 MG tablet Take 40 mg by mouth at bedtime.    [provider]  ferrous sulfate  325 (65 FE) MG tablet Take 1 tablet (325 mg total) by mouth 3 (three) times a week. Take on Monday, Wednesday, and friday 04/18/23   Pokhrel, Laxman, MD  finasteride  (PROSCAR ) 5 MG tablet Take 1 tablet (5 mg total) by mouth daily. 04/17/23   Pokhrel, Laxman, MD  Fluticasone -Umeclidin-Vilant (TRELEGY ELLIPTA ) 100-62.5-25 MCG/ACT AEPB Inhale 1 puff into the lungs in the morning. 09/17/23   Hunsucker, Archer Kobs, MD  gabapentin  (NEURONTIN ) 400 MG capsule Take 2 capsules (800  mg total) by mouth 2 (two) times daily. Patient taking differently: Take 400-800 mg by mouth See admin instructions. Take 400 mg twice daily and 800 mg at bedtime 04/17/23   Pokhrel, Laxman, MD  Glucagon, rDNA, (GLUCAGON EMERGENCY) 1 MG KIT Inject 1 mg into the vein as needed (low blood sugar).    [provider]  glucose 4 GM chewable tablet Chew 2 tablets by mouth as needed for low blood sugar.    [provider]  HYDROcodone -acetaminophen  (NORCO/VICODIN) 5-325 MG tablet Take 1-2 tablets by mouth every 6 (six) hours as needed for moderate pain (pain score 4-6). 08/25/23 08/24/24  Virgina Grills, MD  Insulin  Aspart FlexPen (NOVOLOG ) 100 UNIT/ML Inject 35-50 Units into the skin in the morning, at  noon, and at bedtime.    [provider]  insulin  glargine-yfgn (SEMGLEE) 100 UNIT/ML injection Inject 40 Units into the skin 2 (two) times daily.    [provider]  lidocaine  (LIDODERM ) 5 % Place 1 patch onto the skin daily as needed (pain). Apply to back    [provider]  Melatonin 3 MG CAPS Take 6 mg by mouth at bedtime.    [provider]  metoCLOPramide  (REGLAN ) 10 MG tablet Take 5 mg by mouth in the morning and at bedtime.    [provider]  metoprolol  succinate (TOPROL -XL) 50 MG 24 hr tablet Take 0.5 tablets (25 mg total) by mouth daily. 04/17/23   Pokhrel, Laxman, MD  nitroGLYCERIN  (NITROSTAT ) 0.4 MG SL tablet Place 0.4 mg under the tongue every 5 (five) minutes as needed for chest pain.    [provider]  rosuvastatin  (CRESTOR ) 40 MG tablet Take 20 mg by mouth daily.    [provider]  senna-docusate (SENOKOT-S) 8.6-50 MG tablet Take 2 tablets by mouth at bedtime.    [provider]  sitaGLIPtin (JANUVIA) 50 MG tablet Take 50 mg by mouth daily.    [provider]  sucralfate (CARAFATE) 1 GM/10ML suspension Take 1 g by mouth 4 (four) times daily as needed (acid reflux).    [provider]  tamsulosin  (FLOMAX ) 0.4 MG CAPS capsule Take 2 capsules (0.8 mg total) by mouth at bedtime. 04/17/23   Pokhrel, Laxman, MD  vitamin B-12 (CYANOCOBALAMIN ) 500 MCG tablet Take 1,000 mcg by mouth daily.    [provider]      Allergies    Patient has no known allergies.    Review of Systems   Review of Systems  Physical Exam Updated Vital Signs BP 117/86 (BP Location: Right Arm)   Pulse 80   Temp 98.3 F (36.8 C) (Oral)   Resp 18   SpO2 100%  Physical Exam Vitals and nursing note reviewed.  Constitutional:      Appearance: He is well-developed.  HENT:     Head: Normocephalic and atraumatic.  Eyes:     Pupils: Pupils are equal, round, and reactive to light.  Neck:     Vascular: No JVD.   Cardiovascular:     Rate and Rhythm: Normal rate and regular rhythm.     Heart sounds: No murmur heard.    No friction rub. No gallop.  Pulmonary:     Effort: No respiratory distress.     Breath sounds: No wheezing.  Abdominal:     General: There is no distension.     Tenderness: There is no abdominal tenderness. There is no guarding or rebound.  Musculoskeletal:        General: Normal  range of motion.     Cervical back: Normal range of motion and neck supple.     Comments: Pain mostly in the left anterior rib angles about ribs 6 through 8.  No obvious external signs of trauma.  No obvious midline spinal tenderness or deformities.  Palpated from head to toe without any other obvious noted areas of bony tenderness.  Skin:    Coloration: Skin is not pale.     Findings: No rash.  Neurological:     Mental Status: He is alert and oriented to person, place, and time.  Psychiatric:        Behavior: Behavior normal.     ED Results / Procedures / Treatments   Labs (all labs ordered are listed, but only abnormal results are displayed) Labs Reviewed - No data to display  EKG None  Radiology DG Ribs Unilateral W/Chest Left Result Date: 12/09/2023 CLINICAL DATA:  chest wall pain post fall EXAM: LEFT RIBS AND CHEST - 3+ VIEW COMPARISON:  08/25/2023 FINDINGS: No fracture or other bone lesions are seen involving the ribs. There is no evidence of pneumothorax or pleural effusion. Both lungs are clear. Heart size and mediastinal contours are within normal limits. Stable implanted stimulator overlying the right chest, incomplete visualization of lead extending cephalad. Cervical fixation hardware. IMPRESSION: Negative. Electronically Signed   By: Nicoletta Barrier M.D.   On: 12/09/2023 12:02    Procedures Procedures    Medications Ordered in ED Medications  acetaminophen  (TYLENOL ) tablet 1,000 mg (1,000 mg Oral Given 12/09/23 1055)  oxyCODONE  (Oxy IR/ROXICODONE ) immediate release tablet 5 mg  (5 mg Oral Given 12/09/23 1055)  ketorolac  (TORADOL ) 15 MG/ML injection 15 mg (15 mg Intramuscular Given 12/09/23 1055)    ED Course/ Medical Decision Making/ A&P                                 Medical Decision Making Amount and/or Complexity of Data Reviewed Radiology: ordered.  Risk OTC drugs. Prescription drug management.   63 yo M with a chief complaints of a fall.  Nonsyncopal by history complaining of left-sided chest discomfort.  Will obtain a plain film.  Treat pain.  Reassess.  Plain film of the chest independently interpreted by me without obvious displaced rib fracture.  No pneumothorax.  Discussed results with patient.  Will discharge home.  PCP follow-up.  12:20 PM:  I have discussed the diagnosis/risks/treatment options with the patient.  Evaluation and diagnostic testing in the emergency department does not suggest an emergent condition requiring admission or immediate intervention beyond what has been performed at this time.  They will follow up with PCP. We also discussed returning to the ED immediately if new or worsening sx occur. We discussed the sx which are most concerning (e.g., sudden worsening pain, fever, inability to tolerate by mouth, sob) that necessitate immediate return. Medications administered to the patient during their visit and any new prescriptions provided to the patient are listed below.  Medications given during this visit Medications  acetaminophen  (TYLENOL ) tablet 1,000 mg (1,000 mg Oral Given 12/09/23 1055)  oxyCODONE  (Oxy IR/ROXICODONE ) immediate release tablet 5 mg (5 mg Oral Given 12/09/23 1055)  ketorolac  (TORADOL ) 15 MG/ML injection 15 mg (15 mg Intramuscular Given 12/09/23 1055)     The patient appears reasonably screen and/or stabilized for discharge and I doubt any other medical condition or other [facility] requiring further screening, evaluation, or treatment in the ED  at this time prior to discharge.          Final Clinical  Impression(s) / ED Diagnoses Final diagnoses:  Rib pain on left side    Rx / DC Orders ED Discharge Orders          Ordered    morphine  (MSIR) 15 MG tablet  Every 4 hours PRN        12/09/23 1218              Albertus Hughs, DO 12/09/23 1220

## 2023-12-09 NOTE — Discharge Instructions (Signed)
 Your x-ray did not show an obvious broken rib.  Regardless he still might have a broken rib.  The treatment is pain control with deep breaths.  We are going to give you a device to show you that you are taking big deep breaths.  Another thing that tends to help a lot are the small pillows that are on your couch she can hold it up against your chest where it hurts and that can help you take big deep breaths cough or sneeze.  Please use it 10 minutes out of every hour that you are awake for at least the next few days.  Please return for uncontrolled pain or worsening difficulty breathing.  Take 4 over the counter ibuprofen tablets 3 times a day or 2 over-the-counter naproxen tablets twice a day for pain. Also take tylenol  1000mg (2 extra strength) four times a day.   Then take the pain medicine if you feel like you need it. Narcotics do not help with the pain, they only make you care about it less.  You can become addicted to this, people may break into your house to steal it.  It will constipate you.  If you drive under the influence of this medicine you can get a DUI.

## 2023-12-09 NOTE — ED Triage Notes (Signed)
 Pt BIBA from home for fall around 2am, not sure how long he was down but able to get up and get back in bed. Having left side rib pain esp with deep inhalation, some left shoulder pain. Does not think he hit his head but unsure. No thinners. VSS

## 2024-01-06 NOTE — Progress Notes (Unsigned)
 @Patient  ID: Nathaniel Colon, male    DOB: 1961/01/10, 63 y.o.   MRN: [id number]  No chief complaint on file.   Referring provider: [facility]  HPI: 63 y.o. man whom we are seeing for evaluation of dyspnea on exertion.    Previous [facility] pulmonary encounter:  10/06/2023 Patient presents today for Inspire activation. He was last seen by our office in May by Dr. Marygrace Snellen for dyspnea symptoms. Hx sleep apnea, intolerant to CPAP. Uses continuous O2 2L/min 24 hours.   > Sleep study done through the [place], AHI 71.5  > Intolerant to CPAP  > Hypoglossal nerve stimulator (Inspire) implant date 08/25/23 with Dr. Tellis Feathers    Discussed the use of AI scribe software for clinical note transcription with the patient, who gave verbal consent to proceed.  History of Present Illness   Nathaniel Colon is a 63 year old male who presents for activation of the Inspire device following implantation for sleep apnea.  The Inspire device was implanted in January after a sleep study conducted several months prior through the [place]. He had previously attempted CPAP therapy but was unable to tolerate it. Since the implantation, he has not experienced any issues, including trouble swallowing or pain at the site.  He uses supplemental oxygen at a rate of two liters per minute while at rest. His current oxygen tank lasts about an hour.  Regarding his sleep, he typically goes to bed between 10 and 11 PM, taking melatonin around 8 to 9 PM to aid in falling asleep, which usually takes about an hour. He wakes up multiple times during the night to urinate, sometimes as frequently as twenty times, affecting his ability to return to sleep quickly.      Sensation level - 0.9 Functional level- 0.7 V (lower limit 0.7 - upper limit 1.7) Start delay- 60 mins Pause delay - 20 mins Sleep duration-  9 hours   01/07/2024- Interim hx  Patient presents today for 4-6 week follow-up following inspire activation on 10/06/23.          No Known Allergies  Immunization History  Administered Date(s) Administered   Influenza,inj,Quad PF,6+ Mos 06/21/2021, 07/15/2022   Moderna Sars-Covid-2 Vaccination 11/18/2019, 12/16/2019   PNEUMOCOCCAL CONJUGATE-20 06/21/2021   Tdap 08/13/2015   Zoster Recombinant(Shingrix) 04/28/2020, 10/25/2020    Past Medical History:  Diagnosis Date   Anginal pain (HCC)    Arthritis    Chronic kidney disease    Complication of anesthesia    patient reports "hard to wake up after having EGD   COPD (chronic obstructive pulmonary disease) (HCC)    Coronary artery disease    Depression    Diabetes mellitus    Diabetic gastroparesis (HCC)    Dyspnea    GERD (gastroesophageal reflux disease)    H/O hiatal hernia    Hypertension    Neuromuscular disorder (HCC)    MUSCLE SPASMS   Peripheral vascular disease (HCC)    PERIPHERAL NEUROPATHY    Sleep apnea     Tobacco History: Social History   Tobacco Use  Smoking Status Former   Current packs/day: 0.00   Average packs/day: 0.5 packs/day for 30.0 years (15.0 ttl pk-yrs)   Types: Cigarettes   Start date: 54   Quit date: 2018   Years since quitting: 7.4  Smokeless Tobacco Never   Counseling given: Not Answered   Outpatient Medications Prior to Visit  Medication Sig Dispense Refill   albuterol  (VENTOLIN  HFA) 108 (90 Base) MCG/ACT inhaler Inhale  2 puffs into the lungs every 6 (six) hours as needed for wheezing or shortness of breath. 8 g 3   aluminum hydroxide-magnesium carbonate (ACID GONE) 95-358 MG/15ML SUSP Take 30 mLs by mouth 4 (four) times daily as needed for heartburn.     amLODipine  (NORVASC ) 5 MG tablet Take 1 tablet (5 mg total) by mouth daily. 30 tablet 2   aspirin  EC 81 MG EC tablet Take 1 tablet (81 mg total) by mouth daily. 30 tablet 0   capsaicin (ZOSTRIX) 0.025 % cream Apply 1 Application topically 2 (two) times daily as needed (foot pain).     carboxymethylcellulose (REFRESH PLUS) 0.5 % SOLN Place 1-2  drops into both eyes as needed (irritation).     cefUROXime (CEFTIN) 500 MG tablet Take by mouth.     cholecalciferol  (VITAMIN D3) 25 MCG (1000 UNIT) tablet Take 1,000 Units by mouth daily.     diclofenac  Sodium (VOLTAREN ) 1 % GEL Apply 1 Application topically 2 (two) times daily as needed (back pain).     DULoxetine  (CYMBALTA ) 60 MG capsule Take 60 mg by mouth daily.     esomeprazole (NEXIUM) 40 MG capsule Take 40 mg by mouth 2 (two) times daily before a meal.     famotidine (PEPCID) 40 MG tablet Take 40 mg by mouth at bedtime.     ferrous sulfate  325 (65 FE) MG tablet Take 1 tablet (325 mg total) by mouth 3 (three) times a week. Take on Monday, Wednesday, and friday 100 tablet 3   finasteride  (PROSCAR ) 5 MG tablet Take 1 tablet (5 mg total) by mouth daily. 30 tablet 2   Fluticasone -Umeclidin-Vilant (TRELEGY ELLIPTA ) 100-62.5-25 MCG/ACT AEPB Inhale 1 puff into the lungs in the morning. 60 each 11   gabapentin  (NEURONTIN ) 400 MG capsule Take 2 capsules (800 mg total) by mouth 2 (two) times daily. (Patient taking differently: Take 400-800 mg by mouth See admin instructions. Take 400 mg twice daily and 800 mg at bedtime) 60 capsule 2   Glucagon, rDNA, (GLUCAGON EMERGENCY) 1 MG KIT Inject 1 mg into the vein as needed (low blood sugar).     glucose 4 GM chewable tablet Chew 2 tablets by mouth as needed for low blood sugar.     HYDROcodone -acetaminophen  (NORCO/VICODIN) 5-325 MG tablet Take 1-2 tablets by mouth every 6 (six) hours as needed for moderate pain (pain score 4-6). 12 tablet 0   Insulin  Aspart FlexPen (NOVOLOG ) 100 UNIT/ML Inject 35-50 Units into the skin in the morning, at noon, and at bedtime.     insulin  glargine-yfgn (SEMGLEE) 100 UNIT/ML injection Inject 40 Units into the skin 2 (two) times daily.     lidocaine  (LIDODERM ) 5 % Place 1 patch onto the skin daily as needed (pain). Apply to back     Melatonin 3 MG CAPS Take 6 mg by mouth at bedtime.     metoCLOPramide  (REGLAN ) 10 MG tablet  Take 5 mg by mouth in the morning and at bedtime.     metoprolol  succinate (TOPROL -XL) 50 MG 24 hr tablet Take 0.5 tablets (25 mg total) by mouth daily. 30 tablet 2   morphine  (MSIR) 15 MG tablet Take 0.5 tablets (7.5 mg total) by mouth every 4 (four) hours as needed for severe pain (pain score 7-10). 5 tablet 0   nitroGLYCERIN  (NITROSTAT ) 0.4 MG SL tablet Place 0.4 mg under the tongue every 5 (five) minutes as needed for chest pain.     rosuvastatin  (CRESTOR ) 40 MG tablet Take 20 mg by  mouth daily.     senna-docusate (SENOKOT-S) 8.6-50 MG tablet Take 2 tablets by mouth at bedtime.     sitaGLIPtin (JANUVIA) 50 MG tablet Take 50 mg by mouth daily.     sucralfate (CARAFATE) 1 GM/10ML suspension Take 1 g by mouth 4 (four) times daily as needed (acid reflux).     tamsulosin  (FLOMAX ) 0.4 MG CAPS capsule Take 2 capsules (0.8 mg total) by mouth at bedtime. 60 capsule 2   vitamin B-12 (CYANOCOBALAMIN ) 500 MCG tablet Take 1,000 mcg by mouth daily.     No facility-administered medications prior to visit.      Review of Systems  Review of Systems   Physical Exam  There were no vitals taken for this visit. Physical Exam   Lab Results:  CBC    Component Value Date/Time   WBC 8.2 07/29/2023 1345   RBC 5.66 07/29/2023 1345   HGB 16.3 07/29/2023 1457   HCT 48.0 07/29/2023 1457   PLT 241 07/29/2023 1345   MCV 89.6 07/29/2023 1345   MCH 28.8 07/29/2023 1345   MCHC 32.1 07/29/2023 1345   RDW 14.3 07/29/2023 1345    BMET    Component Value Date/Time   NA 135 07/29/2023 1457   K 4.2 07/29/2023 1457   CL 98 07/29/2023 1457   CO2 25 04/17/2023 0823   GLUCOSE 367 (H) 07/29/2023 1457   BUN 21 07/29/2023 1457   CREATININE 1.60 (H) 07/29/2023 1457   CALCIUM  9.0 04/17/2023 0823   GFRNONAA 48 (L) 04/17/2023 0823   GFRAA >60 03/15/2016 1253    BNP    Component Value Date/Time   BNP 19.0 06/18/2022 1203    ProBNP No results found for: "PROBNP"  Imaging: DG Ribs Unilateral W/Chest  Left Result Date: 12/09/2023 CLINICAL DATA:  chest wall pain post fall EXAM: LEFT RIBS AND CHEST - 3+ VIEW COMPARISON:  08/25/2023 FINDINGS: No fracture or other bone lesions are seen involving the ribs. There is no evidence of pneumothorax or pleural effusion. Both lungs are clear. Heart size and mediastinal contours are within normal limits. Stable implanted stimulator overlying the right chest, incomplete visualization of lead extending cephalad. Cervical fixation hardware. IMPRESSION: Negative. Electronically Signed   By: Nicoletta Barrier M.D.   On: 12/09/2023 12:02     Assessment & Plan:   No problem-specific Assessment & Plan notes found for this encounter.     Antonio Baumgarten, NP 01/06/2024

## 2024-01-07 ENCOUNTER — Encounter: Payer: Self-pay | Admitting: Primary Care

## 2024-01-07 ENCOUNTER — Encounter (HOSPITAL_BASED_OUTPATIENT_CLINIC_OR_DEPARTMENT_OTHER): Payer: Self-pay | Admitting: Primary Care

## 2024-01-07 ENCOUNTER — Ambulatory Visit (HOSPITAL_BASED_OUTPATIENT_CLINIC_OR_DEPARTMENT_OTHER): Admitting: Primary Care

## 2024-01-07 VITALS — BP 131/81 | HR 86 | Ht 74.0 in | Wt 211.9 lb

## 2024-01-07 DIAGNOSIS — G8929 Other chronic pain: Secondary | ICD-10-CM | POA: Diagnosis not present

## 2024-01-07 DIAGNOSIS — G4733 Obstructive sleep apnea (adult) (pediatric): Secondary | ICD-10-CM | POA: Diagnosis not present

## 2024-01-07 DIAGNOSIS — G4700 Insomnia, unspecified: Secondary | ICD-10-CM | POA: Diagnosis not present

## 2024-01-07 DIAGNOSIS — Z87891 Personal history of nicotine dependence: Secondary | ICD-10-CM

## 2024-01-07 MED ORDER — ESZOPICLONE 1 MG PO TABS: 1.0000 mg | ORAL_TABLET | Freq: Every evening | ORAL | 1 refills | Status: DC | PRN

## 2024-01-07 NOTE — Patient Instructions (Signed)
 -  SLEEP APNEA: Sleep apnea is a condition where your breathing repeatedly stops and starts during sleep. We will increase your Inspire device to level 4 today and you should increase it by one level each Wednesday as tolerated. Make sure to use the device consistently, aiming for at least 70% usage for more than four hours per night. We will plan a future study to assess the effectiveness of the device at different levels. If you wake up during the night, you can pause and restart the device. Set an alarm to ensure you activate the device at bedtime.  -INSOMNIA: Insomnia is difficulty falling or staying asleep. We will prescribe a sleep aid for you, but do not take it with opioid pain medications. Establish a consistent bedtime routine and set a recurring alarm to remind you to activate the Inspire device.  -CHRONIC PAIN: Chronic pain is long-term pain that persists beyond the usual recovery period. You are currently using lidocaine  5% patches for pain management. We will follow up on your referral to a pain clinic or pain specialist.  INSTRUCTIONS: Increase your Inspire device to level 4 today and then increase it by one level each Wednesday as tolerated. Use the device consistently, aiming for at least 70% usage for more than four hours per night. Set an alarm to ensure you activate the device at bedtime. We will prescribe a sleep aid for you; do not take it with opioid pain medications. Establish a consistent bedtime routine. Follow up on your referral to a pain clinic or pain specialist.  Follow-up Readiness check with [facility] NP

## 2024-01-15 NOTE — Telephone Encounter (Signed)
 Extended auth received, expiring 8/4/20253

## 2024-02-09 ENCOUNTER — Encounter: Payer: Self-pay | Admitting: Primary Care

## 2024-02-09 ENCOUNTER — Ambulatory Visit (INDEPENDENT_AMBULATORY_CARE_PROVIDER_SITE_OTHER): Admitting: Primary Care

## 2024-02-09 VITALS — BP 124/72 | HR 81 | Temp 97.5°F | Ht 74.0 in | Wt 212.6 lb

## 2024-02-09 DIAGNOSIS — Z87891 Personal history of nicotine dependence: Secondary | ICD-10-CM

## 2024-02-09 DIAGNOSIS — G4700 Insomnia, unspecified: Secondary | ICD-10-CM

## 2024-02-09 DIAGNOSIS — G4733 Obstructive sleep apnea (adult) (pediatric): Secondary | ICD-10-CM | POA: Diagnosis not present

## 2024-02-09 MED ORDER — ESZOPICLONE 2 MG PO TABS: 2.0000 mg | ORAL_TABLET | Freq: Every evening | ORAL | 2 refills | Status: DC | PRN

## 2024-02-09 NOTE — Progress Notes (Signed)
 @Patient  ID: Nathaniel Colon, male    DOB: 24-Feb-1961, 63 y.o.   MRN: [id number]  Chief Complaint  Patient presents with   Follow-up    Inspire f/u     Referring provider: [facility], [facility]  HPI: 63 y.o. man whom we are seeing for evaluation of dyspnea on exertion.    Previous [facility] pulmonary encounter:  10/06/2023 Patient presents today for Inspire activation. He was last seen by our office in May by Dr. Annella for dyspnea symptoms. Hx sleep apnea, intolerant to CPAP. Uses continuous O2 2L/min 24 hours.   > Sleep study done through the [facility], AHI 71.5  > Intolerant to CPAP  > Hypoglossal nerve stimulator (Inspire) implant date 08/25/23 with Dr. Carlie    Discussed the use of AI scribe software for clinical note transcription with the patient, who gave verbal consent to proceed.  History of Present Illness   Nathaniel Colon is a 63 year old male who presents for activation of the Inspire device following implantation for sleep apnea.  The Inspire device was implanted in January after a sleep study conducted several months prior through the [facility]. He had previously attempted CPAP therapy but was unable to tolerate it. Since the implantation, he has not experienced any issues, including trouble swallowing or pain at the site.  He uses supplemental oxygen at a rate of two liters per minute while at rest. His current oxygen tank lasts about an hour.  Regarding his sleep, he typically goes to bed between 10 and 11 PM, taking melatonin around 8 to 9 PM to aid in falling asleep, which usually takes about an hour. He wakes up multiple times during the night to urinate, sometimes as frequently as twenty times, affecting his ability to return to sleep quickly.      Sensation level - 0.9 Functional level- 0.7 V (lower limit 0.7 - upper limit 1.7) Start delay- 60 mins Pause delay - 20 mins Sleep duration-  9 hours   01/07/2024 Discussed the use of AI scribe software for clinical note  transcription with the patient, who gave verbal consent to proceed.  History of Present Illness   Nathaniel Colon is a 63 year old male with sleep apnea who presents for 4-6 week follow-up following inspire activation on 10/06/23.   He experiences ongoing difficulties with sleep primarily due to his sleep apnea. Inspire use is inconsistent. On nights he uses the device, he does not wake himself up snoring, which was a previous issue. However, he struggles to fall asleep within the hour the device is active and often has to pause or restart it. He also experiences frequent awakenings at night to urinate, which disrupts his sleep further.  He is currently using a lidocaine  patch for pain management, applying it as needed. He has not been prescribed any other pain medications and is awaiting an appointment with a pain specialist. For sleep, he uses melatonin.  He has not increased the level of his Inspire device recently. He reports feeling the device's stimulation when awake, which can be uncomfortable. A previous sleep study recorded seventy-one apneic events per hour. No waking up snoring on nights he uses the [place] device.  He has difficulty falling asleep and frequent awakenings at night.    02/09/2024- Interim hx  Discussed the use of AI scribe software for clinical note transcription with the patient, who gave verbal consent to proceed.  History of Present Illness   Nathaniel Colon is a 63  year old male with severe sleep apnea who presents for follow-up regarding his hypoglossal nerve stimulator.  He has severe sleep apnea, initially diagnosed through sleep studies conducted by the [facility], which showed intolerance to CPAP therapy. He underwent implantation of a hypoglossal nerve stimulator on January 13th, and the device was activated in February. He has difficulty remembering to increase the device's settings weekly, having only done so once or twice this month. He uses the device nightly, with  usage averaging about 40 hours per week, equating to approximately five hours per night. He is currently on level four of the device, with an amplitude of 1.0, and has not experienced any discomfort or pain from its use. He also uses supplemental oxygen at night, at a rate of two liters.  He continues to experience difficulty falling and staying asleep, despite the use of Lunesta , which he takes at a dose of 1 mg. He takes the medication between 8 to 10 PM, but still struggles with sleep onset, sometimes not falling asleep until midnight or later. He notes that when he does fall asleep, his sleep quality improves significantly when using Inspire, as he is not snoring or waking himself up. He acknowledges a subjective benefit from the device, stating 'when I am sleeping, I sleep a whole lot better.'  No trouble swallowing.   Sleep Sync compliance report 12/21/2023 - 01/19/2024 Total nights used 7/30 days (23%) greater than 4 hours Average hours per night used 8 hours 21 minutes Average pauses per night 1.0 Current amplitude 0.9 voltage (upper limit 1.7V/lower limit 0.7V)     No Known Allergies  Immunization History  Administered Date(s) Administered   Influenza,inj,Quad PF,6+ Mos 06/21/2021, 07/15/2022   Moderna Sars-Covid-2 Vaccination 11/18/2019, 12/16/2019   PNEUMOCOCCAL CONJUGATE-20 06/21/2021   Tdap 08/13/2015   Zoster Recombinant(Shingrix) 04/28/2020, 10/25/2020    Past Medical History:  Diagnosis Date   Anginal pain (HCC)    Arthritis    Chronic kidney disease    Complication of anesthesia    patient reports hard to wake up after having EGD   COPD (chronic obstructive pulmonary disease) (HCC)    Coronary artery disease    Depression    Diabetes mellitus    Diabetic gastroparesis (HCC)    Dyspnea    GERD (gastroesophageal reflux disease)    H/O hiatal hernia    Hypertension    Neuromuscular disorder (HCC)    MUSCLE SPASMS   Peripheral vascular disease (HCC)    PERIPHERAL  NEUROPATHY    Sleep apnea     Tobacco History: Social History   Tobacco Use  Smoking Status Former   Current packs/day: 0.00   Average packs/day: 0.5 packs/day for 30.0 years (15.0 ttl pk-yrs)   Types: Cigarettes   Start date: 72   Quit date: 2018   Years since quitting: 7.4  Smokeless Tobacco Never   Counseling given: Not Answered   Outpatient Medications Prior to Visit  Medication Sig Dispense Refill   albuterol  (VENTOLIN  HFA) 108 (90 Base) MCG/ACT inhaler Inhale 2 puffs into the lungs every 6 (six) hours as needed for wheezing or shortness of breath. 8 g 3   aluminum hydroxide-magnesium carbonate (ACID GONE) 95-358 MG/15ML SUSP Take 30 mLs by mouth 4 (four) times daily as needed for heartburn.     amLODipine  (NORVASC ) 5 MG tablet Take 1 tablet (5 mg total) by mouth daily. 30 tablet 2   aspirin  EC 81 MG EC tablet Take 1 tablet (81 mg total) by mouth daily.  30 tablet 0   bethanechol (URECHOLINE) 10 MG tablet Take 10 mg by mouth 3 (three) times daily.     capsaicin (ZOSTRIX) 0.025 % cream Apply 1 Application topically 2 (two) times daily as needed (foot pain).     carboxymethylcellulose (REFRESH PLUS) 0.5 % SOLN Place 1-2 drops into both eyes as needed (irritation).     cefUROXime (CEFTIN) 500 MG tablet Take by mouth.     cholecalciferol  (VITAMIN D3) 25 MCG (1000 UNIT) tablet Take 1,000 Units by mouth daily.     diclofenac  Sodium (VOLTAREN ) 1 % GEL Apply 1 Application topically 2 (two) times daily as needed (back pain).     DULoxetine  (CYMBALTA ) 60 MG capsule Take 60 mg by mouth daily.     esomeprazole (NEXIUM) 40 MG capsule Take 40 mg by mouth 2 (two) times daily before a meal.     eszopiclone  (LUNESTA ) 1 MG TABS tablet Take 1 tablet (1 mg total) by mouth at bedtime as needed for sleep. Take immediately before bedtime 30 tablet 1   famotidine (PEPCID) 40 MG tablet Take 40 mg by mouth at bedtime.     ferrous sulfate  325 (65 FE) MG tablet Take 1 tablet (325 mg total) by mouth 3  (three) times a week. Take on Monday, Wednesday, and friday 100 tablet 3   finasteride  (PROSCAR ) 5 MG tablet Take 1 tablet (5 mg total) by mouth daily. 30 tablet 2   Fluticasone -Umeclidin-Vilant (TRELEGY ELLIPTA ) 100-62.5-25 MCG/ACT AEPB Inhale 1 puff into the lungs in the morning. 60 each 11   gabapentin  (NEURONTIN ) 400 MG capsule Take 2 capsules (800 mg total) by mouth 2 (two) times daily. (Patient taking differently: Take 400-800 mg by mouth See admin instructions. Take 400 mg twice daily and 800 mg at bedtime) 60 capsule 2   Glucagon, rDNA, (GLUCAGON EMERGENCY) 1 MG KIT Inject 1 mg into the vein as needed (low blood sugar).     glucose 4 GM chewable tablet Chew 2 tablets by mouth as needed for low blood sugar.     HYDROcodone -acetaminophen  (NORCO/VICODIN) 5-325 MG tablet Take 1-2 tablets by mouth every 6 (six) hours as needed for moderate pain (pain score 4-6). 12 tablet 0   Insulin  Aspart FlexPen (NOVOLOG ) 100 UNIT/ML Inject 35-50 Units into the skin in the morning, at noon, and at bedtime.     insulin  glargine-yfgn (SEMGLEE) 100 UNIT/ML injection Inject 40 Units into the skin 2 (two) times daily.     lidocaine  (LIDODERM ) 5 % Place 1 patch onto the skin daily as needed (pain). Apply to back     Melatonin 3 MG CAPS Take 6 mg by mouth at bedtime.     metoCLOPramide  (REGLAN ) 10 MG tablet Take 5 mg by mouth in the morning and at bedtime.     metoprolol  succinate (TOPROL -XL) 50 MG 24 hr tablet Take 0.5 tablets (25 mg total) by mouth daily. 30 tablet 2   morphine  (MSIR) 15 MG tablet Take 0.5 tablets (7.5 mg total) by mouth every 4 (four) hours as needed for severe pain (pain score 7-10). 5 tablet 0   nitroGLYCERIN  (NITROSTAT ) 0.4 MG SL tablet Place 0.4 mg under the tongue every 5 (five) minutes as needed for chest pain.     rosuvastatin  (CRESTOR ) 40 MG tablet Take 20 mg by mouth daily.     senna-docusate (SENOKOT-S) 8.6-50 MG tablet Take 2 tablets by mouth at bedtime.     sitaGLIPtin (JANUVIA) 50 MG  tablet Take 50 mg by mouth daily.  sucralfate (CARAFATE) 1 GM/10ML suspension Take 1 g by mouth 4 (four) times daily as needed (acid reflux).     tamsulosin  (FLOMAX ) 0.4 MG CAPS capsule Take 2 capsules (0.8 mg total) by mouth at bedtime. 60 capsule 2   vitamin B-12 (CYANOCOBALAMIN ) 500 MCG tablet Take 1,000 mcg by mouth daily.     No facility-administered medications prior to visit.   Review of Systems  Review of Systems  Constitutional: Negative.   HENT: Negative.    Respiratory: Negative.    Psychiatric/Behavioral:  Negative for sleep disturbance.    Physical Exam  BP 124/72 (BP Location: Left Arm, Patient Position: Sitting, Cuff Size: Normal)   Pulse 81   Temp (!) 97.5 F (36.4 C) (Temporal)   Ht 6' 2 (1.88 m)   Wt 212 lb 9.6 oz (96.4 kg)   SpO2 93%   BMI 27.30 kg/m  Physical Exam Constitutional:      Appearance: Normal appearance.  HENT:     Head: Normocephalic and atraumatic.   Cardiovascular:     Rate and Rhythm: Normal rate and regular rhythm.  Pulmonary:     Effort: Pulmonary effort is normal.     Breath sounds: Normal breath sounds.     Comments: CTA; wearing 2L supplemental oxygen  Neurological:     General: No focal deficit present.     Mental Status: He is alert and oriented to person, place, and time. Mental status is at baseline.   Psychiatric:        Mood and Affect: Mood normal.        Behavior: Behavior normal.        Thought Content: Thought content normal.        Judgment: Judgment normal.      Lab Results:  CBC    Component Value Date/Time   WBC 8.2 07/29/2023 1345   RBC 5.66 07/29/2023 1345   HGB 16.3 07/29/2023 1457   HCT 48.0 07/29/2023 1457   PLT 241 07/29/2023 1345   MCV 89.6 07/29/2023 1345   MCH 28.8 07/29/2023 1345   MCHC 32.1 07/29/2023 1345   RDW 14.3 07/29/2023 1345    BMET    Component Value Date/Time   NA 135 07/29/2023 1457   K 4.2 07/29/2023 1457   CL 98 07/29/2023 1457   CO2 25 04/17/2023 0823   GLUCOSE  367 (H) 07/29/2023 1457   BUN 21 07/29/2023 1457   CREATININE 1.60 (H) 07/29/2023 1457   CALCIUM  9.0 04/17/2023 0823   GFRNONAA 48 (L) 04/17/2023 0823   GFRAA >60 03/15/2016 1253    BNP    Component Value Date/Time   BNP 19.0 06/18/2022 1203    ProBNP No results found for: PROBNP  Imaging: No results found.   Assessment & Plan:   1. Obstructive sleep apnea (Primary)  2. Insomnia, unspecified type  Assessment and Plan    Severe sleep apnea Severe sleep apnea managed with hypoglossal nerve stimulator (Inspire) implanted in January. Device activated in February. Current usage is about 40 hours a week, approximately 5 hours per night. Subjective improvement with better sleep quality and reduced snoring. Currently on level four (1.0V) with plans to increase to level five. No discomfort with current settings. - Increase Inspire device to level five (1.1V) today. - Instruct to bring remote to next visit for data upload. - Use Inspire device every night for a minimum of four hours. - Increase Inspire level weekly as tolerated, with assistance from nurse every Tuesday. - FU 4-6  weeks for readiness check  - Schedule Inspire titration study after reaching higher levels.  Insomnia Persistent difficulty falling asleep despite use of Lunesta  1 mg. Reports variable sleep onset times ranging from 8 PM to 1 AM. Current dose may be insufficient. - Increase Lunesta  to 2 mg nightly. - Instruct to activate Inspire device before taking Lunesta  to ensure device is on before falling asleep.  Oxygen therapy Oxygen therapy at 2 liters per minute used at night. No issues reported with current oxygen use. - Continue oxygen therapy at 2 L/min at night.  Almarie LELON Ferrari, NP 02/09/2024

## 2024-02-09 NOTE — Patient Instructions (Addendum)
  -  SEVERE SLEEP APNEA: Severe sleep apnea is a condition where your breathing repeatedly stops and starts during sleep. You are managing this with a hypoglossal nerve stimulator, which helps keep your airway open. We increased your Inspire to level five today and continue using it every night for at least four hours. Remember to bring your remote to the next visit for data upload. Increase the device level weekly as tolerated, with assistance from the nurse every Tuesday. We will schedule a titration study after you reach higher levels.   Ask nurse to help you increase Inspire device to level 6 on TUESDAYS July 8th. Increase the device level weekly as tolerated, with assistance from the nurse every Tuesday. We will schedule a titration study after you reach higher levels.   -INSOMNIA: Insomnia is difficulty falling or staying asleep. Despite using Lunesta , you still have trouble falling asleep. We are increasing your Lunesta  dose to 2 mg nightly. Make sure to activate your Inspire device before taking Lunesta  to ensure it is on before you fall asleep.  -OXYGEN THERAPY: You are using oxygen therapy at night at a rate of 2 liters per minute to help with your breathing. Continue this therapy as it is currently working well for you.  INSTRUCTIONS: Bring your remote to the next visit for data upload. Continue using the Inspire device every night for at least four hours and increase the level weekly as tolerated. Also, start taking 2 mg of Lunesta  nightly and ensure the Inspire device is activated before taking the medication.  Follow-up 4-6 weeks with Landry NP for inspire readiness check                         Contains text generated by [employer].

## 2024-02-18 ENCOUNTER — Encounter: Payer: Self-pay | Admitting: Primary Care

## 2024-03-10 ENCOUNTER — Ambulatory Visit (INDEPENDENT_AMBULATORY_CARE_PROVIDER_SITE_OTHER): Admitting: Primary Care

## 2024-03-10 ENCOUNTER — Encounter: Payer: Self-pay | Admitting: Primary Care

## 2024-03-10 VITALS — BP 159/98 | HR 88 | Temp 97.7°F | Ht 74.0 in | Wt 218.4 lb

## 2024-03-10 DIAGNOSIS — Z87891 Personal history of nicotine dependence: Secondary | ICD-10-CM | POA: Diagnosis not present

## 2024-03-10 DIAGNOSIS — J449 Chronic obstructive pulmonary disease, unspecified: Secondary | ICD-10-CM | POA: Diagnosis not present

## 2024-03-10 DIAGNOSIS — G4733 Obstructive sleep apnea (adult) (pediatric): Secondary | ICD-10-CM | POA: Diagnosis not present

## 2024-03-10 DIAGNOSIS — G4700 Insomnia, unspecified: Secondary | ICD-10-CM | POA: Diagnosis not present

## 2024-03-10 NOTE — Patient Instructions (Signed)
  Today, you came in for a readiness check for your Inspire titration. We discussed your current use of the Inspire device, your experience with sleep aids, and your management of COPD. You reported some discomfort with the Inspire device after increasing the level, but overall, you are seeing benefits. We also reviewed your use of Trelegy and your experience with nocturia and insomnia.  YOUR PLAN:  -OBSTRUCTIVE SLEEP APNEA WITH INSPIRE DEVICE: Obstructive sleep apnea is a condition where your airway becomes blocked during sleep, causing breathing pauses. You are using the Inspire device to help manage this condition. We will maintain your current settings at level 8 and increase to level 9 next Wednesday. We will also increase the pause time to 25 minutes for better comfort. Please ensure you use the device every night for at least four hours. If your usage is above 70%, we will schedule a titration study.  -CHRONIC OBSTRUCTIVE PULMONARY DISEASE (COPD): COPD is a chronic lung condition that makes it hard to breathe. You are managing this with Trelegy once daily and using a rescue inhaler as needed. Continue with your current medication regimen and use the rescue inhaler when you experience difficulty breathing.  -INSOMNIA: Insomnia is difficulty falling or staying asleep. You are currently managing this with Lunesta  and have discontinued melatonin. Continue with your current dose of Lunesta  as it is helping you sleep without causing grogginess.  INSTRUCTIONS:  Please follow up with us  if your usage of the Inspire device is above 70% so we can schedule a titration study. Continue using your current medications as prescribed and let us  know if you experience any new symptoms or issues.

## 2024-03-10 NOTE — Progress Notes (Signed)
 @Patient  ID: Nathaniel Colon, male    DOB: [birth year], 63 y.o.   MRN: [id number]  No chief complaint on file.   Referring provider: [facility], [facility]   HPI: 63 y.o. man whom we are seeing for evaluation of dyspnea on exertion.     Previous [facility] pulmonary encounter:  10/06/2023 Patient presents today for Inspire activation. He was last seen by our office in May by Dr. Annella for dyspnea symptoms. Hx sleep apnea, intolerant to CPAP. Uses continuous O2 2L/min 24 hours.    > Sleep study done through the [facility], AHI 71.5  > Intolerant to CPAP  > Hypoglossal nerve stimulator (Inspire) implant date 08/25/23 with Dr. Carlie      Discussed the use of AI scribe software for clinical note transcription with the patient, who gave verbal consent to proceed.   History of Present Illness   Nathaniel Colon is a 63 year old male who presents for activation of the Inspire device following implantation for sleep apnea.   The Inspire device was implanted in January after a sleep study conducted several months prior through the [facility]. He had previously attempted CPAP therapy but was unable to tolerate it. Since the implantation, he has not experienced any issues, including trouble swallowing or pain at the site.   He uses supplemental oxygen at a rate of two liters per minute while at rest. His current oxygen tank lasts about an hour.   Regarding his sleep, he typically goes to bed between 10 and 11 PM, taking melatonin around 8 to 9 PM to aid in falling asleep, which usually takes about an hour. He wakes up multiple times during the night to urinate, sometimes as frequently as twenty times, affecting his ability to return to sleep quickly.       Sensation level - 0.9 Functional level- 0.7 V (lower limit 0.7 - upper limit 1.7) Start delay- 60 mins Pause delay - 20 mins Sleep duration-  9 hours     01/07/2024 Discussed the use of AI scribe software for clinical note transcription with the patient,  who gave verbal consent to proceed.   History of Present Illness   Nathaniel Colon is a 63 year old male with sleep apnea who presents for 4-6 week follow-up following inspire activation on 10/06/23.    He experiences ongoing difficulties with sleep primarily due to his sleep apnea. Inspire use is inconsistent. On nights he uses the device, he does not wake himself up snoring, which was a previous issue. However, he struggles to fall asleep within the hour the device is active and often has to pause or restart it. He also experiences frequent awakenings at night to urinate, which disrupts his sleep further.   He is currently using a lidocaine  patch for pain management, applying it as needed. He has not been prescribed any other pain medications and is awaiting an appointment with a pain specialist. For sleep, he uses melatonin.   He has not increased the level of his Inspire device recently. He reports feeling the device's stimulation when awake, which can be uncomfortable. A previous sleep study recorded seventy-one apneic events per hour. No waking up snoring on nights he uses the Evaro device.   He has difficulty falling asleep and frequent awakenings at night.      02/09/2024 Discussed the use of AI scribe software for clinical note transcription with the patient, who gave verbal consent to proceed.   History of Present Illness  Nathaniel Colon is a 63 year old male with severe sleep apnea who presents for follow-up regarding his hypoglossal nerve stimulator.   He has severe sleep apnea, initially diagnosed through sleep studies conducted by the [facility], which showed intolerance to CPAP therapy. He underwent implantation of a hypoglossal nerve stimulator on January 13th, and the device was activated in February. He has difficulty remembering to increase the device's settings weekly, having only done so once or twice this month. He uses the device nightly, with usage averaging about 40 hours per  week, equating to approximately five hours per night. He is currently on level four of the device, with an amplitude of 1.0, and has not experienced any discomfort or pain from its use. He also uses supplemental oxygen at night, at a rate of two liters.   He continues to experience difficulty falling and staying asleep, despite the use of Lunesta , which he takes at a dose of 1 mg. He takes the medication between 8 to 10 PM, but still struggles with sleep onset, sometimes not falling asleep until midnight or later. He notes that when he does fall asleep, his sleep quality improves significantly when using Inspire, as he is not snoring or waking himself up. He acknowledges a subjective benefit from the device, stating 'when I am sleeping, I sleep a whole lot better.'   No trouble swallowing.    Sleep Sync compliance report 12/21/2023 - 01/19/2024 Total nights used 7/30 days (23%) greater than 4 hours Average hours per night used 8 hours 21 minutes Average pauses per night 1.0 Current amplitude 0.9 voltage (upper limit 1.7V/lower limit 0.7V)   Severe sleep apnea Severe sleep apnea managed with hypoglossal nerve stimulator (Inspire) implanted in January. Device activated in February. Current usage is about 40 hours a week, approximately 5 hours per night. Subjective improvement with better sleep quality and reduced snoring. Currently on level four (1.0V) with plans to increase to level five. No discomfort with current settings. - Increase Inspire device to level five (1.1V) today. - Instruct to bring remote to next visit for data upload. - Use Inspire device every night for a minimum of four hours. - Increase Inspire level weekly as tolerated, with assistance from nurse every Tuesday. - FU 4-6 weeks for readiness check  - Schedule Inspire titration study after reaching higher levels.   Insomnia Persistent difficulty falling asleep despite use of Lunesta  1 mg. Reports variable sleep onset times  ranging from 8 PM to 1 AM. Current dose may be insufficient. - Increase Lunesta  to 2 mg nightly. - Instruct to activate Inspire device before taking Lunesta  to ensure device is on before falling asleep.   Oxygen therapy Oxygen therapy at 2 liters per minute used at night. No issues reported with current oxygen use. - Continue oxygen therapy at 2 L/min at night.    03/10/2024  Discussed the use of AI scribe software for clinical note transcription with the patient, who gave verbal consent to proceed.  History of Present Illness   RIAZ ONORATO is a 63 year old male with sleep apnea who presents for a readiness check for Inspire titration.  He has been using the Inspire device and recently increased the level to seven. He manages the settings independently and notes a benefit from the device, although he experiences some discomfort for a couple of days after increasing the level. Some nights, frequent bathroom trips prevent adequate sleep.  He uses oxygen at night at a rate of two  liters. Previously, he took Lunesta  at a dose of two mg but found it too sedating. He has since adjusted his regimen, currently taking Lunesta  without melatonin, and reports improved sleep without grogginess, waking up around 6:30 to 7:00 AM and staying awake throughout the day.  He continues to use Trelegy once a day for his breathing and occasionally uses a rescue inhaler, approximately once every other week, when experiencing difficulty breathing. No recent respiratory infections or colored mucus.  He experiences nocturia, waking up at night to use the restroom frequently, which affects his sleep. He is taking tamsulosin  (Flomax ) as prescribed. He also experiences pain at times, which can wake him up at night.      Sleep Sync compliance report Total nights used /30 days () greater than 4 hours Average hours per night used  Average pauses per night  Current amplitude 1.3 voltage (upper limit 1.7V/lower limit  0.7V)   No Known Allergies  Immunization History  Administered Date(s) Administered   Influenza,inj,Quad PF,6+ Mos 06/21/2021, 07/15/2022   Moderna Sars-Covid-2 Vaccination 11/18/2019, 12/16/2019   PNEUMOCOCCAL CONJUGATE-20 06/21/2021   Tdap 08/13/2015   Zoster Recombinant(Shingrix) 04/28/2020, 10/25/2020    Past Medical History:  Diagnosis Date   Anginal pain (HCC)    Arthritis    Chronic kidney disease    Complication of anesthesia    patient reports hard to wake up after having EGD   COPD (chronic obstructive pulmonary disease) (HCC)    Coronary artery disease    Depression    Diabetes mellitus    Diabetic gastroparesis (HCC)    Dyspnea    GERD (gastroesophageal reflux disease)    H/O hiatal hernia    Hypertension    Neuromuscular disorder (HCC)    MUSCLE SPASMS   Peripheral vascular disease (HCC)    PERIPHERAL NEUROPATHY    Sleep apnea     Tobacco History: Social History   Tobacco Use  Smoking Status Former   Current packs/day: 0.00   Average packs/day: 0.5 packs/day for 30.0 years (15.0 ttl pk-yrs)   Types: Cigarettes   Start date: 33   Quit date: 2018   Years since quitting: 7.5  Smokeless Tobacco Never   Counseling given: Not Answered   Outpatient Medications Prior to Visit  Medication Sig Dispense Refill   albuterol  (VENTOLIN  HFA) 108 (90 Base) MCG/ACT inhaler Inhale 2 puffs into the lungs every 6 (six) hours as needed for wheezing or shortness of breath. 8 g 3   aluminum hydroxide-magnesium carbonate (ACID GONE) 95-358 MG/15ML SUSP Take 30 mLs by mouth 4 (four) times daily as needed for heartburn.     amLODipine  (NORVASC ) 5 MG tablet Take 1 tablet (5 mg total) by mouth daily. 30 tablet 2   aspirin  EC 81 MG EC tablet Take 1 tablet (81 mg total) by mouth daily. 30 tablet 0   bethanechol (URECHOLINE) 10 MG tablet Take 10 mg by mouth 3 (three) times daily.     capsaicin (ZOSTRIX) 0.025 % cream Apply 1 Application topically 2 (two) times daily as  needed (foot pain).     carboxymethylcellulose (REFRESH PLUS) 0.5 % SOLN Place 1-2 drops into both eyes as needed (irritation).     cefUROXime (CEFTIN) 500 MG tablet Take by mouth.     cholecalciferol  (VITAMIN D3) 25 MCG (1000 UNIT) tablet Take 1,000 Units by mouth daily.     diclofenac  Sodium (VOLTAREN ) 1 % GEL Apply 1 Application topically 2 (two) times daily as needed (back pain).  DULoxetine  (CYMBALTA ) 60 MG capsule Take 60 mg by mouth daily.     esomeprazole (NEXIUM) 40 MG capsule Take 40 mg by mouth 2 (two) times daily before a meal.     eszopiclone  (LUNESTA ) 2 MG TABS tablet Take 1 tablet (2 mg total) by mouth at bedtime as needed for sleep. Take immediately before bedtime 30 tablet 2   famotidine (PEPCID) 40 MG tablet Take 40 mg by mouth at bedtime.     ferrous sulfate  325 (65 FE) MG tablet Take 1 tablet (325 mg total) by mouth 3 (three) times a week. Take on Monday, Wednesday, and friday 100 tablet 3   finasteride  (PROSCAR ) 5 MG tablet Take 1 tablet (5 mg total) by mouth daily. 30 tablet 2   Fluticasone -Umeclidin-Vilant (TRELEGY ELLIPTA ) 100-62.5-25 MCG/ACT AEPB Inhale 1 puff into the lungs in the morning. 60 each 11   gabapentin  (NEURONTIN ) 400 MG capsule Take 2 capsules (800 mg total) by mouth 2 (two) times daily. (Patient taking differently: Take 400-800 mg by mouth See admin instructions. Take 400 mg twice daily and 800 mg at bedtime) 60 capsule 2   Glucagon, rDNA, (GLUCAGON EMERGENCY) 1 MG KIT Inject 1 mg into the vein as needed (low blood sugar).     glucose 4 GM chewable tablet Chew 2 tablets by mouth as needed for low blood sugar.     HYDROcodone -acetaminophen  (NORCO/VICODIN) 5-325 MG tablet Take 1-2 tablets by mouth every 6 (six) hours as needed for moderate pain (pain score 4-6). 12 tablet 0   Insulin  Aspart FlexPen (NOVOLOG ) 100 UNIT/ML Inject 35-50 Units into the skin in the morning, at noon, and at bedtime.     insulin  glargine-yfgn (SEMGLEE) 100 UNIT/ML injection Inject 40  Units into the skin 2 (two) times daily.     lidocaine  (LIDODERM ) 5 % Place 1 patch onto the skin daily as needed (pain). Apply to back     Melatonin 3 MG CAPS Take 6 mg by mouth at bedtime.     metoCLOPramide  (REGLAN ) 10 MG tablet Take 5 mg by mouth in the morning and at bedtime.     metoprolol  succinate (TOPROL -XL) 50 MG 24 hr tablet Take 0.5 tablets (25 mg total) by mouth daily. 30 tablet 2   morphine  (MSIR) 15 MG tablet Take 0.5 tablets (7.5 mg total) by mouth every 4 (four) hours as needed for severe pain (pain score 7-10). 5 tablet 0   nitroGLYCERIN  (NITROSTAT ) 0.4 MG SL tablet Place 0.4 mg under the tongue every 5 (five) minutes as needed for chest pain.     rosuvastatin  (CRESTOR ) 40 MG tablet Take 20 mg by mouth daily.     senna-docusate (SENOKOT-S) 8.6-50 MG tablet Take 2 tablets by mouth at bedtime.     sitaGLIPtin (JANUVIA) 50 MG tablet Take 50 mg by mouth daily.     sucralfate (CARAFATE) 1 GM/10ML suspension Take 1 g by mouth 4 (four) times daily as needed (acid reflux).     tamsulosin  (FLOMAX ) 0.4 MG CAPS capsule Take 2 capsules (0.8 mg total) by mouth at bedtime. 60 capsule 2   vitamin B-12 (CYANOCOBALAMIN ) 500 MCG tablet Take 1,000 mcg by mouth daily.     No facility-administered medications prior to visit.      Review of Systems  Review of Systems  Constitutional:  Negative for fatigue.  Respiratory: Negative.    Psychiatric/Behavioral:  Negative for sleep disturbance.      Physical Exam  There were no vitals taken for this visit. Physical Exam  Constitutional:      Appearance: Normal appearance.  HENT:     Head: Normocephalic.     Mouth/Throat:     Comments: Tongue protrusion midline  Cardiovascular:     Rate and Rhythm: Normal rate and regular rhythm.  Neurological:     General: No focal deficit present.     Mental Status: He is alert and oriented to person, place, and time. Mental status is at baseline.  Psychiatric:        Mood and Affect: Mood normal.         Behavior: Behavior normal.        Thought Content: Thought content normal.        Judgment: Judgment normal.      Lab Results:  CBC    Component Value Date/Time   WBC 8.2 07/29/2023 1345   RBC 5.66 07/29/2023 1345   HGB 16.3 07/29/2023 1457   HCT 48.0 07/29/2023 1457   PLT 241 07/29/2023 1345   MCV 89.6 07/29/2023 1345   MCH 28.8 07/29/2023 1345   MCHC 32.1 07/29/2023 1345   RDW 14.3 07/29/2023 1345    BMET    Component Value Date/Time   NA 135 07/29/2023 1457   K 4.2 07/29/2023 1457   CL 98 07/29/2023 1457   CO2 25 04/17/2023 0823   GLUCOSE 367 (H) 07/29/2023 1457   BUN 21 07/29/2023 1457   CREATININE 1.60 (H) 07/29/2023 1457   CALCIUM  9.0 04/17/2023 0823   GFRNONAA 48 (L) 04/17/2023 0823   GFRAA >60 03/15/2016 1253    BNP    Component Value Date/Time   BNP 19.0 06/18/2022 1203    ProBNP No results found for: PROBNP  Imaging: No results found.   Assessment & Plan:   No problem-specific Assessment & Plan notes found for this encounter.  Assessment and Plan    Obstructive sleep apnea with Inspire device Managed with Inspire device. Currently on level 7 with an amplitude of 1.3 volts. Compliance has improved to 77%, but usage for more than four hours is at 63%. No recent respiratory infections reported. Experiences occasional discomfort after increasing the device level, but it resolves after a few days. The device is used in conjunction with 2 liters of oxygen at night. Able to operate the device independently and understands the safety features. The current settings include a start delay of 75 minutes and a pause time of 20 minutes, which will be increased to 25 minutes for better comfort. - Maintain current Inspire device settings at level 8 and increase to level 9 next Wednesday. - Schedule Inspire titration study if usage is above 70%. - Increase pause time to 25 minutes. - Ensure usage of the device every night for at least four  hours.  Chronic obstructive pulmonary disease (COPD) Well managed with Trelegy once daily. Occasionally uses a rescue inhaler, approximately once every other week, indicating well-controlled condition. No recent exacerbations or respiratory infections reported. - Continue Trelegy 100mcg once daily. - Use rescue inhaler as needed.  Insomnia Insomnia managed with Lunesta . Previously experienced excessive sedation with a higher dose but is currently stable on the adjusted dose without grogginess. Discontinued melatonin and is satisfied with the current sleep aid regimen. - Continue current dose of Lunesta  2mg  at bedtime - Discontinue melatonin.  Recording duration: 20 minutes     Almarie LELON Ferrari, NP 03/10/2024

## 2024-04-07 ENCOUNTER — Telehealth: Payer: Self-pay | Admitting: Primary Care

## 2024-04-07 NOTE — Telephone Encounter (Signed)
 Insurance denied inspire titration study despite appeal being submitted, we are awaiting response from [facility] to see if they will cover study

## 2024-04-22 ENCOUNTER — Ambulatory Visit (HOSPITAL_BASED_OUTPATIENT_CLINIC_OR_DEPARTMENT_OTHER): Attending: Primary Care | Admitting: Internal Medicine

## 2024-04-22 DIAGNOSIS — G4733 Obstructive sleep apnea (adult) (pediatric): Secondary | ICD-10-CM | POA: Insufficient documentation

## 2024-05-02 DIAGNOSIS — G4733 Obstructive sleep apnea (adult) (pediatric): Secondary | ICD-10-CM

## 2024-05-02 NOTE — Procedures (Signed)
 Darryle Law [facility] [address] [place], [place] [address] Tel: [phone]   Fax: [phone]  Inspire Interpretation  Patient Name:  Nathaniel Colon, Nathaniel Colon Date:  04/22/2024 Referring Physician:  ALMARIE FERRARI [phone]) %%startinterp%% Indications for Polysomnography The patient is a 63 year old Male who is 6' 2 and weighs 212.0 lbs. His BMI equals 27.2.  A full night inspire treatment study was performed. Baseline on file.  Medication  No Data.   Polysomnogram Data A full night polysomnogram recorded the standard physiologic parameters including EEG, EOG, EMG, EKG, nasal and oral airflow.  Respiratory parameters of chest and abdominal movements were recorded with Respiratory Inductance Plethysmography belts.  Oxygen saturation was recorded by pulse oximetry.   Sleep Architecture The total recording time of the polysomnogram was 407.5 minutes.  The total sleep time was 178.0 minutes.  The patient spent 28.1% of total sleep time in Stage N1, 61.2% in Stage N2, 0.0% in Stages N3, and 10.7% in REM.  Sleep latency was 26.4 minutes.  REM latency was 239.0 minutes.  Sleep Efficiency was 43.7%.  Wake after Sleep Onset time was 203.0 minutes.  Inspire Summary The patient was on inspire therapy at levels ranging from 1.3* volts up to 1.6* volts.  The last level used in the study was 1.6* volts.  Respiratory Events The polysomnogram revealed a presence of - obstructive, - central, and - mixed apneas resulting in an Apnea index of - events per hour.  There were 104 hypopneas (>=3% desaturation and/or arousal) resulting in an Apnea\Hypopnea Index (AHI >=3% desaturation and/or arousal) of 35.1 events per hour.  There were 83 hypopneas (>=4% desaturation) resulting in an Apnea\Hypopnea Index (AHI >=4% desaturation) of 28.0 events per hour.  There were 22 Respiratory Effort Related Arousals resulting in a RERA index of 7.4 events per hour. The Respiratory  Disturbance Index is 42.5 events per hour.  The snore index was 365.1 events per hour.  Mean oxygen saturation was 92.6%.  The lowest oxygen saturation during sleep was 82.0%.  Time spent <=88% oxygen saturation was 15.4 minutes (3.8%).  Limb Activity There were 49 limb movements recorded.  Of this total, 49 were classified as PLMs.  Of the PLMs, 1 were associated with arousals.  The Limb Movement index was 16.5 per hour while the PLM index was 16.5 per hour.  Cardiac Summary The average pulse rate was 82.8 bpm.  The minimum pulse rate was 70.0 bpm while the maximum pulse rate was 97.0 bpm.  Cardiac rhythm was normal/abnormal.  Comments: Inspire titration to 1.6V with residual AHI(4%) 14.6/hr, oxygen saturation nadir 86%, mean 91.1%. See titration table and technician comments below.  Diagnosis: Obstructive sleep apnea   Recommendations: Further titration if tolerated and appropriate.   This study was personally reviewed and electronically signed by: Dr. Reggy Salt Accredited Board Certified in Sleep Medicine Date/Time: 05/02/24  11:35   %%endinterp%%  Inspire Report  Patient Name: Nathaniel Colon, Nathaniel Colon Date: 04/22/2024  Date of Birth: 1960/10/05 Study Type: Elizabeth  Age: 10 year MRN #: [id number]  Sex: Male Interpreting Physician: SALT REGGY, [id number]  Height: 6' 2 Referring Physician: ALMARIE FERRARI [phone])  Weight: 212.0 lbs Recording Tech: Dewane Hacker CRT RPSGT RST  BMI: 27.2 Scoring Tech: Dewane Hacker CRT RPSGT RST  ESS: 10 Neck Size: 16.75    Final Voltage: 1.6    Supplemental O2: N/A   Study Overview  Lights Off: 09:57:44 PM  Count Index  Lights On: 04:45:15 AM Awakenings:  46 15.5  Time in Bed: 407.5 min. Arousals: 103 34.7  Total Sleep Time: 178.0 min. AHI (>=3% Desat and/or Ar.): 104 35.1   Sleep Efficiency: 43.7% AHI (>=4% Desat): 83 28.0   Sleep Latency: 26.4 min. Limb Movements: 49 16.5  Wake After Sleep Onset: 203.0 min. Snore: 1083 365.1  REM  Latency from Sleep Onset: 239.0 min. Desaturations: 111 37.4     Minimum SpO2 TST: 82.0%    Sleep Architecture  % of Time in Bed Stages Time (mins) % Sleep Time  Wake 230.0   Stage N1 50.0 28.1%  Stage N2 109.0 61.2%  Stage N3 0.0 0.0%  REM 19.0 10.7%   Arousal Summary   NREM REM Sleep Index  Respiratory Arousals 60 2 62 20.9  PLM Arousals 1 - 1 0.3  Isolated Limb Movement Arousals - - - -  Snore Arousals 19 - 19 6.4  Spontaneous Arousals 18 3 21  7.1  Total 98 5 103 34.7   Limb Movement Summary   Count Index  Isolated Limb Movements - -  Periodic Limb Movements (PLMs) 49 16.5  Total Limb Movements 49 16.5    Respiratory Summary   By Sleep Stage By Body Position Total   NREM REM Supine Non-Supine   Time (min) 159.0 19.0 - 178.0 178.0         Obstructive Apnea - - - - -  Mixed Apnea - - - - -  Central Apnea - - - - -  Total Apneas - - - - -  Total Apnea Index - - - - -         Hypopneas (>=3% Desat and/or Ar.) 91 13 - 104 104  AHI (>=3% Desat and/or Ar.) 34.3 41.1 - 35.1 35.1         Hypopneas (>=4% Desat) 73 10 - 83 83  AHI (>=4% Desat) 27.5 31.6 - 28.0 28.0          RERAs 22 - - 22 22  RERA Index 8.3 - - 7.4 7.4         RDI 42.6 41.1 - 42.5 42.5     Respiratory Event Durations   Apnea Hypopnea   NREM REM NREM REM  Average (seconds) - - 29.1 28.9  Maximum (seconds) - - 53.2 42.3    Oxygen Saturation Summary   Wake NREM REM TST TIB  Average SpO2 93.6% 91.3% 90.4% 91.2% 92.6%  Minimum SpO2 82.0% 83.0% 82.0% 82.0% 82.0%  Maximum SpO2 99.0% 97.0% 95.0% 97.0% 99.0%   Oxygen Saturation Distribution  Range (%) Time in range (min) Time in range (%)  90.0 - 100.0 320.3 79.4%  80.0 - 90.0 83.2 20.6%  70.0 - 80.0 - -  60.0 - 70.0 - -  50.0 - 60.0 - -  0.0 - 50.0 - -  Time Spent <=88% SpO2  Range (%) Time in range (min) Time in range (%)  0.0 - 88.0 15.4 3.8%      Count Index  Desaturations 111 37.4    Cardiac Summary   Wake NREM REM Sleep  Total  Average Pulse Rate (BPM) 85.3 79.7 80.0 79.8 82.8  Minimum Pulse Rate (BPM) 70.0 70.0 75.0 70.0 70.0  Maximum Pulse Rate (BPM) 97.0 89.0 86.0 89.0 97.0   Pulse Rate Distribution:  Range (bpm) Time in range (min) Time in range (%)  0.0 - 40.0 - -  40.0 - 60.0 - -  60.0 - 80.0 155.4 38.5%  80.0 - 100.0 235.6 58.4%  100.0 - 120.0 - -  120.0 - 140.0 - -  140.0 - 200.0 - -   Inspire Summary  Device Volts O2 Level Time (min) Wake (min) NREM (min) REM (min) Sleep Eff% OA# CA# MA# Hyp# (>=3%) AHI (>=3%) Hyp# (>=4%) AHI (>=%4) RERA RDI SpO2 <=88% (min) Min SpO2 Mean SpO2 Ar. Index  - Off - 199.5 149.0 50.5 0.0 25.3% - - - 53 63.0 43  51.1 11  76.0  5.4 83.0 91.9 57.0  INSPIRE 1.3 - 20.0 2.5 17.5 0.0 87.5% - - - 11 37.7 8  27.4 9  68.6  0.3 87.0 92.3 58.3  INSPIRE 1.4 - 54.5 26.0 20.0 8.5 52.3% - - - 13 27.4 11  23.2 1  29.5  4.3 83.0 90.9 18.9  INSPIRE 1.5 - 48.0 3.5 34.0 10.5 92.7% - - - 17 22.9 12  16.2 -  22.9  0.9 82.0 90.9 10.8  INSPIRE 1.6 - 86.0 49.0 37.0 0.0 43.0% - - - 10 16.2 9  14.6 1  17.8  0.8 86.0 91.1 34.1    Hypnograms                           Technologist Comments  THE 31-YEAR-OLD MALE PATIENT PRESENTED TO THE SLEEP DISORDER CENTER FOR AN INSPIRE TITRATION STUDY WITH A CHIEF COMPLAINT OF OSA. A TONGUE EXAM WAS PERFORMED WITH NO NEUROPRAXIA OBSERVED. THE INCISION SITES WERE EXAMINED FOR INFECTION WITH NO OBVIOUS SIGNS. THE PATIENT REPORTED GOOD SUBJECTIVE BENEFITS FROM THE THERAPY. HE STATED HE DOESN'T SNORE LIKE HE USED TO. THE PATIENT DID STATED HE HAS A HISTORY OF SWALLOWING FOOD, AND SOMETIMES GETS CHOKED. HE ALSO STATED HE HAS NOTICED NECK SWELLING WITHIN THE LAST MONTH. ALL BEDTIME MEDICATIONS WERE SELF ADMINISTERED AT 2100, BUT THE PATIENT COULD NOT IDENTIFY WHAT THE MEDICATIONS WERE. THE INSPIRE STUDY WAS EXPLAINED, THE LEAD PLACEMENT WAS INITIATED, THEN THE STUDY WAS BEGUN. THE INSPIRE TITRATION WAS INITIATED ON AN AMP OF 1.3 VOLTS  (0.2 VOLTS BELOW THE INCOMING AMPLITUDE) AND INCREASED TO A MAXIMUM AMPLITUDE OF 1.6 VOLTS. ON THE FINAL SETTING, SNORING CONTINUED. SUPPLEMENTAL OXYGEN WAS NOT WARRANTED. PLMs - PLMAs WERE NOTED. NO OBVIOUS CARDIAC ARRHYTHMIAS WERE OBSERVED. NO RESTROOM VISITS WERE MADE. NO OBVIOUS PARASOMNIAS WERE NOTED. MODERATE TO LOUD AUDIBLE SNORING WERE OBSERVED THROUGHOUT THE STUDY. DURING THE STUDY AT 0122, THE PATIENT'S GLUCOMETER ALARMED BECAUSE OF HIS BLOOD GLUCOSE DECREASED TO 62 mg/dl. THE PATIENT WAS THEN GIVEN 2 - 4 OUNCE CONTAINERS OF ORANGE JUICE AND HE CONSUMED A PACK OF PEANUT BUTTER CRACKERS TO INCREASE THE LEVELS WITHIN RANGE.  THE PATIENT TOLERATED THE INSPIRE TITRATION WELL.                         Reggy Salt Diplomate, Biomedical engineer of Sleep Medicine  ELECTRONICALLY SIGNED ON:  05/02/2024, 11:29 AM [facility] PH: [phone]   FX: [phone] ACCREDITED BY THE [employer]ICAN [employer] OF SLEEP MEDICINE

## 2024-05-02 NOTE — Procedures (Signed)
  Indications for Polysomnography The patient is a 63 year old Male who is 6' 2 and weighs 212.0 lbs. His BMI equals 27.2.  A full night inspire treatment study was performed.  MedicationNo Data. Polysomnogram Data A full night polysomnogram recorded the standard physiologic parameters including EEG, EOG, EMG, EKG, nasal and oral airflow.  Respiratory parameters of chest and abdominal movements were recorded with Respiratory Inductance Plethysmography belts.   Oxygen saturation was recorded by pulse oximetry.  Sleep Architecture The total recording time of the polysomnogram was 407.5 minutes.  The total sleep time was 178.0 minutes.  The patient spent 28.1% of total sleep time in Stage N1, 61.2% in Stage N2, 0.0% in Stages N3, and 10.7% in REM.  Sleep latency was 26.4 minutes.   REM latency was 239.0 minutes.  Sleep Efficiency was 43.7%.  Wake after Sleep Onset time was 203.0 minutes.  Inspire Summary The patient was on inspire therapy at levels ranging from 1.3* volts up to 1.6* volts.  The last level used in the study was 1.6* volts.  Respiratory Events The polysomnogram revealed a presence of - obstructive, - central, and - mixed apneas resulting in an Apnea index of - events per hour.  There were 104 hypopneas (GreaterEqual to3% desaturation and/or arousal) resulting in an Apnea\Hypopnea Index (AHI  GreaterEqual to3% desaturation and/or arousal) of 35.1 events per hour.  There were 83 hypopneas (GreaterEqual to4% desaturation) resulting in an Apnea\Hypopnea Index (AHI GreaterEqual to4% desaturation) of 28.0 events per hour.  There were 22  Respiratory Effort Related Arousals resulting in a RERA index of 7.4 events per hour. The Respiratory Disturbance Index is 42.5 events per hour.  The snore index was 365.1 events per hour.  Mean oxygen saturation was 92.6%.  The lowest oxygen saturation during sleep was 82.0%.  Time spent LessEqual to88% oxygen saturation was  minutes ().  Limb  Activity There were 49 limb movements recorded.  Of this total, 49 were classified as PLMs.  Of the PLMs, 1 were associated with arousals.  The Limb Movement index was 16.5 per hour while the PLM index was 16.5 per hour.  Cardiac Summary The average pulse rate was 82.8 bpm.  The minimum pulse rate was 70.0 bpm while the maximum pulse rate was 97.0 bpm.  Cardiac rhythm was normal/abnormal.  Comments:  Diagnosis:  Recommendations:   This study was personally reviewed and electronically signed by: Dr. Reggy Salt Accredited Board Certified in Sleep Medicine Date/Time:

## 2024-05-14 ENCOUNTER — Ambulatory Visit: Admitting: Primary Care

## 2024-05-14 DIAGNOSIS — J9611 Chronic respiratory failure with hypoxia: Secondary | ICD-10-CM

## 2024-05-14 NOTE — Progress Notes (Deleted)
 @Patient  ID: Nathaniel Colon, male    DOB: Dec 18, 1960, 63 y.o.   MRN: [id number]  No chief complaint on file.   Referring provider: [facility], [facility]  HPI:  64 y.o. man whom we are seeing for evaluation of dyspnea on exertion.     Previous [facility] pulmonary encounter:  10/06/2023 Patient presents today for Inspire activation. He was last seen by our office in May by Dr. Annella for dyspnea symptoms. Hx sleep apnea, intolerant to CPAP. Uses continuous O2 2L/min 24 hours.    > Sleep study done through the [facility], AHI 71.5  > Intolerant to CPAP  > Hypoglossal nerve stimulator (Inspire) implant date 08/25/23 with Dr. Carlie      Discussed the use of AI scribe software for clinical note transcription with the patient, who gave verbal consent to proceed.   History of Present Illness   Nathaniel Colon is a 63 year old male who presents for activation of the Inspire device following implantation for sleep apnea.   The Inspire device was implanted in January after a sleep study conducted several months prior through the [facility]. He had previously attempted CPAP therapy but was unable to tolerate it. Since the implantation, he has not experienced any issues, including trouble swallowing or pain at the site.   He uses supplemental oxygen at a rate of two liters per minute while at rest. His current oxygen tank lasts about an hour.   Regarding his sleep, he typically goes to bed between 10 and 11 PM, taking melatonin around 8 to 9 PM to aid in falling asleep, which usually takes about an hour. He wakes up multiple times during the night to urinate, sometimes as frequently as twenty times, affecting his ability to return to sleep quickly.       Sensation level - 0.9 Functional level- 0.7 V (lower limit 0.7 - upper limit 1.7) Start delay- 60 mins Pause delay - 20 mins Sleep duration-  9 hours     01/07/2024 Discussed the use of AI scribe software for clinical note transcription with the patient,  who gave verbal consent to proceed.   History of Present Illness   Nathaniel Colon is a 63 year old male with sleep apnea who presents for 4-6 week follow-up following inspire activation on 10/06/23.    He experiences ongoing difficulties with sleep primarily due to his sleep apnea. Inspire use is inconsistent. On nights he uses the device, he does not wake himself up snoring, which was a previous issue. However, he struggles to fall asleep within the hour the device is active and often has to pause or restart it. He also experiences frequent awakenings at night to urinate, which disrupts his sleep further.   He is currently using a lidocaine  patch for pain management, applying it as needed. He has not been prescribed any other pain medications and is awaiting an appointment with a pain specialist. For sleep, he uses melatonin.   He has not increased the level of his Inspire device recently. He reports feeling the device's stimulation when awake, which can be uncomfortable. A previous sleep study recorded seventy-one apneic events per hour. No waking up snoring on nights he uses the [place] device.   He has difficulty falling asleep and frequent awakenings at night.      02/09/2024 Discussed the use of AI scribe software for clinical note transcription with the patient, who gave verbal consent to proceed.   History of Present Illness  Nathaniel Colon is a 63 year old male with severe sleep apnea who presents for follow-up regarding his hypoglossal nerve stimulator.   He has severe sleep apnea, initially diagnosed through sleep studies conducted by the [facility], which showed intolerance to CPAP therapy. He underwent implantation of a hypoglossal nerve stimulator on January 13th, and the device was activated in February. He has difficulty remembering to increase the device's settings weekly, having only done so once or twice this month. He uses the device nightly, with usage averaging about 40 hours per  week, equating to approximately five hours per night. He is currently on level four of the device, with an amplitude of 1.0, and has not experienced any discomfort or pain from its use. He also uses supplemental oxygen at night, at a rate of two liters.   He continues to experience difficulty falling and staying asleep, despite the use of Lunesta , which he takes at a dose of 1 mg. He takes the medication between 8 to 10 PM, but still struggles with sleep onset, sometimes not falling asleep until midnight or later. He notes that when he does fall asleep, his sleep quality improves significantly when using Inspire, as he is not snoring or waking himself up. He acknowledges a subjective benefit from the device, stating 'when I am sleeping, I sleep a whole lot better.'   No trouble swallowing.    Sleep Sync compliance report 12/21/2023 - 01/19/2024 Total nights used 7/30 days (23%) greater than 4 hours Average hours per night used 8 hours 21 minutes Average pauses per night 1.0 Current amplitude 0.9 voltage (upper limit 1.7V/lower limit 0.7V)   Severe sleep apnea Severe sleep apnea managed with hypoglossal nerve stimulator (Inspire) implanted in January. Device activated in February. Current usage is about 40 hours a week, approximately 5 hours per night. Subjective improvement with better sleep quality and reduced snoring. Currently on level four (1.0V) with plans to increase to level five. No discomfort with current settings. - Increase Inspire device to level five (1.1V) today. - Instruct to bring remote to next visit for data upload. - Use Inspire device every night for a minimum of four hours. - Increase Inspire level weekly as tolerated, with assistance from nurse every Tuesday. - FU 4-6 weeks for readiness check  - Schedule Inspire titration study after reaching higher levels.   Insomnia Persistent difficulty falling asleep despite use of Lunesta  1 mg. Reports variable sleep onset times  ranging from 8 PM to 1 AM. Current dose may be insufficient. - Increase Lunesta  to 2 mg nightly. - Instruct to activate Inspire device before taking Lunesta  to ensure device is on before falling asleep.   Oxygen therapy Oxygen therapy at 2 liters per minute used at night. No issues reported with current oxygen use. - Continue oxygen therapy at 2 L/min at night.    03/10/2024  Discussed the use of AI scribe software for clinical note transcription with the patient, who gave verbal consent to proceed.  History of Present Illness   Nathaniel Colon is a 63 year old male with sleep apnea who presents for a readiness check for Inspire titration.  He has been using the Inspire device and recently increased the level to seven. He manages the settings independently and notes a benefit from the device, although he experiences some discomfort for a couple of days after increasing the level. Some nights, frequent bathroom trips prevent adequate sleep.  He uses oxygen at night at a rate of two  liters. Previously, he took Lunesta  at a dose of two mg but found it too sedating. He has since adjusted his regimen, currently taking Lunesta  without melatonin, and reports improved sleep without grogginess, waking up around 6:30 to 7:00 AM and staying awake throughout the day.  He continues to use Trelegy once a day for his breathing and occasionally uses a rescue inhaler, approximately once every other week, when experiencing difficulty breathing. No recent respiratory infections or colored mucus.  He experiences nocturia, waking up at night to use the restroom frequently, which affects his sleep. He is taking tamsulosin  (Flomax ) as prescribed. He also experiences pain at times, which can wake him up at night.      Sleep Sync compliance report Total nights used /30 days () greater than 4 hours Average hours per night used  Average pauses per night  Current amplitude 1.3 voltage (upper limit 1.7V/lower limit  0.7V)     05/14/2024- Interim hx  Discussed the use of AI scribe software for clinical note transcription with the patient, who gave verbal consent to proceed.  History of Present Illness   S   No Known Allergies  Immunization History  Administered Date(s) Administered  . Influenza,inj,Quad PF,6+ Mos 06/21/2021, 07/15/2022  . Moderna Sars-Covid-2 Vaccination 11/18/2019, 12/16/2019  . PNEUMOCOCCAL CONJUGATE-20 06/21/2021  . Tdap 08/13/2015  . Zoster Recombinant(Shingrix) 04/28/2020, 10/25/2020    Past Medical History:  Diagnosis Date  . Anginal pain   . Arthritis   . Chronic kidney disease   . Complication of anesthesia    patient reports hard to wake up after having EGD  . COPD (chronic obstructive pulmonary disease) (HCC)   . Coronary artery disease   . Depression   . Diabetes mellitus   . Diabetic gastroparesis (HCC)   . Dyspnea   . GERD (gastroesophageal reflux disease)   . H/O hiatal hernia   . Hypertension   . Neuromuscular disorder (HCC)    MUSCLE SPASMS  . Peripheral vascular disease    PERIPHERAL NEUROPATHY   . Sleep apnea     Tobacco History: Social History   Tobacco Use  Smoking Status Former  . Current packs/day: 0.00  . Average packs/day: 0.5 packs/day for 30.0 years (15.0 ttl pk-yrs)  . Types: Cigarettes  . Start date: 89  . Quit date: 2018  . Years since quitting: 7.7  Smokeless Tobacco Never   Counseling given: Not Answered   Outpatient Medications Prior to Visit  Medication Sig Dispense Refill  . albuterol  (VENTOLIN  HFA) 108 (90 Base) MCG/ACT inhaler Inhale 2 puffs into the lungs every 6 (six) hours as needed for wheezing or shortness of breath. 8 g 3  . aluminum hydroxide-magnesium carbonate (ACID GONE) 95-358 MG/15ML SUSP Take 30 mLs by mouth 4 (four) times daily as needed for heartburn.    . amLODipine  (NORVASC ) 5 MG tablet Take 1 tablet (5 mg total) by mouth daily. 30 tablet 2  . aspirin  EC 81 MG EC tablet Take 1 tablet (81 mg  total) by mouth daily. 30 tablet 0  . bethanechol (URECHOLINE) 10 MG tablet Take 10 mg by mouth 3 (three) times daily.    . capsaicin (ZOSTRIX) 0.025 % cream Apply 1 Application topically 2 (two) times daily as needed (foot pain).    . carboxymethylcellulose (REFRESH PLUS) 0.5 % SOLN Place 1-2 drops into both eyes as needed (irritation).    . cefUROXime (CEFTIN) 500 MG tablet Take by mouth.    . cholecalciferol  (VITAMIN D3) 25 MCG (1000  UNIT) tablet Take 1,000 Units by mouth daily.    . diclofenac  Sodium (VOLTAREN ) 1 % GEL Apply 1 Application topically 2 (two) times daily as needed (back pain).    . DULoxetine  (CYMBALTA ) 60 MG capsule Take 60 mg by mouth daily.    SABRA esomeprazole (NEXIUM) 40 MG capsule Take 40 mg by mouth 2 (two) times daily before a meal.    . eszopiclone  (LUNESTA ) 2 MG TABS tablet Take 1 tablet (2 mg total) by mouth at bedtime as needed for sleep. Take immediately before bedtime 30 tablet 2  . famotidine (PEPCID) 40 MG tablet Take 40 mg by mouth at bedtime.    . ferrous sulfate  325 (65 FE) MG tablet Take 1 tablet (325 mg total) by mouth 3 (three) times a week. Take on Monday, Wednesday, and friday 100 tablet 3  . finasteride  (PROSCAR ) 5 MG tablet Take 1 tablet (5 mg total) by mouth daily. 30 tablet 2  . Fluticasone -Umeclidin-Vilant (TRELEGY ELLIPTA ) 100-62.5-25 MCG/ACT AEPB Inhale 1 puff into the lungs in the morning. 60 each 11  . gabapentin  (NEURONTIN ) 400 MG capsule Take 2 capsules (800 mg total) by mouth 2 (two) times daily. (Patient taking differently: Take 400-800 mg by mouth See admin instructions. Take 400 mg twice daily and 800 mg at bedtime) 60 capsule 2  . Glucagon, rDNA, (GLUCAGON EMERGENCY) 1 MG KIT Inject 1 mg into the vein as needed (low blood sugar).    SABRA glucose 4 GM chewable tablet Chew 2 tablets by mouth as needed for low blood sugar.    . HYDROcodone -acetaminophen  (NORCO/VICODIN) 5-325 MG tablet Take 1-2 tablets by mouth every 6 (six) hours as needed for  moderate pain (pain score 4-6). 12 tablet 0  . Insulin  Aspart FlexPen (NOVOLOG ) 100 UNIT/ML Inject 35-50 Units into the skin in the morning, at noon, and at bedtime.    . insulin  glargine-yfgn (SEMGLEE) 100 UNIT/ML injection Inject 40 Units into the skin 2 (two) times daily.    . lidocaine  (LIDODERM ) 5 % Place 1 patch onto the skin daily as needed (pain). Apply to back    . Melatonin 3 MG CAPS Take 6 mg by mouth at bedtime.    . metoCLOPramide  (REGLAN ) 10 MG tablet Take 5 mg by mouth in the morning and at bedtime.    . metoprolol  succinate (TOPROL -XL) 50 MG 24 hr tablet Take 0.5 tablets (25 mg total) by mouth daily. 30 tablet 2  . morphine  (MSIR) 15 MG tablet Take 0.5 tablets (7.5 mg total) by mouth every 4 (four) hours as needed for severe pain (pain score 7-10). 5 tablet 0  . nitroGLYCERIN  (NITROSTAT ) 0.4 MG SL tablet Place 0.4 mg under the tongue every 5 (five) minutes as needed for chest pain.    . rosuvastatin  (CRESTOR ) 40 MG tablet Take 20 mg by mouth daily.    SABRA senna-docusate (SENOKOT-S) 8.6-50 MG tablet Take 2 tablets by mouth at bedtime.    . sitaGLIPtin (JANUVIA) 50 MG tablet Take 50 mg by mouth daily.    . sucralfate (CARAFATE) 1 GM/10ML suspension Take 1 g by mouth 4 (four) times daily as needed (acid reflux).    . tamsulosin  (FLOMAX ) 0.4 MG CAPS capsule Take 2 capsules (0.8 mg total) by mouth at bedtime. 60 capsule 2  . vitamin B-12 (CYANOCOBALAMIN ) 500 MCG tablet Take 1,000 mcg by mouth daily.     No facility-administered medications prior to visit.      Review of Systems  Review of Systems  Physical Exam  There were no vitals taken for this visit. Physical Exam  ***  Lab Results:  CBC    Component Value Date/Time   WBC 8.2 07/29/2023 1345   RBC 5.66 07/29/2023 1345   HGB 16.3 07/29/2023 1457   HCT 48.0 07/29/2023 1457   PLT 241 07/29/2023 1345   MCV 89.6 07/29/2023 1345   MCH 28.8 07/29/2023 1345   MCHC 32.1 07/29/2023 1345   RDW 14.3 07/29/2023 1345     BMET    Component Value Date/Time   NA 135 07/29/2023 1457   K 4.2 07/29/2023 1457   CL 98 07/29/2023 1457   CO2 25 04/17/2023 0823   GLUCOSE 367 (H) 07/29/2023 1457   BUN 21 07/29/2023 1457   CREATININE 1.60 (H) 07/29/2023 1457   CALCIUM  9.0 04/17/2023 0823   GFRNONAA 48 (L) 04/17/2023 0823   GFRAA >60 03/15/2016 1253    BNP    Component Value Date/Time   BNP 19.0 06/18/2022 1203    ProBNP No results found for: PROBNP  Imaging: No results found.   Assessment & Plan:   No problem-specific Assessment & Plan notes found for this encounter.   1. Chronic hypoxic respiratory failure (HCC) (Primary)   Assessment and Plan Assessment & Plan        Almarie LELON Ferrari, NP 05/14/2024

## 2024-05-18 ENCOUNTER — Encounter: Payer: Self-pay | Admitting: Primary Care

## 2024-05-18 ENCOUNTER — Ambulatory Visit: Admitting: Primary Care

## 2024-05-18 VITALS — BP 128/68 | HR 96 | Temp 97.7°F | Ht 74.0 in | Wt 214.2 lb

## 2024-05-18 DIAGNOSIS — Z23 Encounter for immunization: Secondary | ICD-10-CM | POA: Diagnosis not present

## 2024-05-18 DIAGNOSIS — G4700 Insomnia, unspecified: Secondary | ICD-10-CM | POA: Diagnosis not present

## 2024-05-18 DIAGNOSIS — J9611 Chronic respiratory failure with hypoxia: Secondary | ICD-10-CM

## 2024-05-18 DIAGNOSIS — Z9682 Presence of neurostimulator: Secondary | ICD-10-CM

## 2024-05-18 DIAGNOSIS — G4733 Obstructive sleep apnea (adult) (pediatric): Secondary | ICD-10-CM | POA: Diagnosis not present

## 2024-05-18 MED ORDER — ESZOPICLONE 1 MG PO TABS: 1.0000 mg | ORAL_TABLET | Freq: Every evening | ORAL | 2 refills | Status: DC | PRN

## 2024-05-18 NOTE — Progress Notes (Signed)
 @Patient  ID: Nathaniel Colon, male    DOB: [birth year], 62 y.o.   MRN: [id number]  Chief Complaint  Patient presents with   Obstructive Sleep Apnea    inspire    Referring provider: Clinic, [facility]  HPI: 63 y.o. man whom we are seeing for evaluation of dyspnea on exertion.     Previous [facility] pulmonary encounter:  10/06/2023 Patient presents today for Inspire activation. He was last seen by our office in May by Dr. Annella for dyspnea symptoms. Hx sleep apnea, intolerant to CPAP. Uses continuous O2 2L/min 24 hours.    > Sleep study done through the [facility], AHI 71.5  > Intolerant to CPAP  > Hypoglossal nerve stimulator (Inspire) implant date 08/25/23 with Dr. Carlie      Discussed the use of AI scribe software for clinical note transcription with the patient, who gave verbal consent to proceed.   History of Present Illness   Nathaniel Colon is a 63 year old male who presents for activation of the Inspire device following implantation for sleep apnea.   The Inspire device was implanted in January after a sleep study conducted several months prior through the [facility]. He had previously attempted CPAP therapy but was unable to tolerate it. Since the implantation, he has not experienced any issues, including trouble swallowing or pain at the site.   He uses supplemental oxygen at a rate of two liters per minute while at rest. His current oxygen tank lasts about an hour.   Regarding his sleep, he typically goes to bed between 10 and 11 PM, taking melatonin around 8 to 9 PM to aid in falling asleep, which usually takes about an hour. He wakes up multiple times during the night to urinate, sometimes as frequently as twenty times, affecting his ability to return to sleep quickly.       Sensation level - 0.9 Functional level- 0.7 V (lower limit 0.7 - upper limit 1.7) Start delay- 60 mins Pause delay - 20 mins Sleep duration-  9 hours     01/07/2024 Discussed the use of AI scribe software  for clinical note transcription with the patient, who gave verbal consent to proceed.   History of Present Illness   Nathaniel Colon is a 63 year old male with sleep apnea who presents for 4-6 week follow-up following inspire activation on 10/06/23.    He experiences ongoing difficulties with sleep primarily due to his sleep apnea. Inspire use is inconsistent. On nights he uses the device, he does not wake himself up snoring, which was a previous issue. However, he struggles to fall asleep within the hour the device is active and often has to pause or restart it. He also experiences frequent awakenings at night to urinate, which disrupts his sleep further.   He is currently using a lidocaine  patch for pain management, applying it as needed. He has not been prescribed any other pain medications and is awaiting an appointment with a pain specialist. For sleep, he uses melatonin.   He has not increased the level of his Inspire device recently. He reports feeling the device's stimulation when awake, which can be uncomfortable. A previous sleep study recorded seventy-one apneic events per hour. No waking up snoring on nights he uses the [place] device.   He has difficulty falling asleep and frequent awakenings at night.      02/09/2024 Discussed the use of AI scribe software for clinical note transcription with the patient, who gave verbal consent  to proceed.   History of Present Illness   Nathaniel Colon is a 63 year old male with severe sleep apnea who presents for follow-up regarding his hypoglossal nerve stimulator.   He has severe sleep apnea, initially diagnosed through sleep studies conducted by the [facility], which showed intolerance to CPAP therapy. He underwent implantation of a hypoglossal nerve stimulator on January 13th, and the device was activated in February. He has difficulty remembering to increase the device's settings weekly, having only done so once or twice this month. He uses the device  nightly, with usage averaging about 40 hours per week, equating to approximately five hours per night. He is currently on level four of the device, with an amplitude of 1.0, and has not experienced any discomfort or pain from its use. He also uses supplemental oxygen at night, at a rate of two liters.   He continues to experience difficulty falling and staying asleep, despite the use of Lunesta , which he takes at a dose of 1 mg. He takes the medication between 8 to 10 PM, but still struggles with sleep onset, sometimes not falling asleep until midnight or later. He notes that when he does fall asleep, his sleep quality improves significantly when using Inspire, as he is not snoring or waking himself up. He acknowledges a subjective benefit from the device, stating 'when I am sleeping, I sleep a whole lot better.'   No trouble swallowing.    Sleep Sync compliance report 12/21/2023 - 01/19/2024 Total nights used 7/30 days (23%) greater than 4 hours Average hours per night used 8 hours 21 minutes Average pauses per night 1.0 Current amplitude 0.9 voltage (upper limit 1.7V/lower limit 0.7V)   Severe sleep apnea Severe sleep apnea managed with hypoglossal nerve stimulator (Inspire) implanted in January. Device activated in February. Current usage is about 40 hours a week, approximately 5 hours per night. Subjective improvement with better sleep quality and reduced snoring. Currently on level four (1.0V) with plans to increase to level five. No discomfort with current settings. - Increase Inspire device to level five (1.1V) today. - Instruct to bring remote to next visit for data upload. - Use Inspire device every night for a minimum of four hours. - Increase Inspire level weekly as tolerated, with assistance from nurse every Tuesday. - FU 4-6 weeks for readiness check  - Schedule Inspire titration study after reaching higher levels.   Insomnia Persistent difficulty falling asleep despite use of  Lunesta  1 mg. Reports variable sleep onset times ranging from 8 PM to 1 AM. Current dose may be insufficient. - Increase Lunesta  to 2 mg nightly. - Instruct to activate Inspire device before taking Lunesta  to ensure device is on before falling asleep.   Oxygen therapy Oxygen therapy at 2 liters per minute used at night. No issues reported with current oxygen use. - Continue oxygen therapy at 2 L/min at night.    03/10/2024 Discussed the use of AI scribe software for clinical note transcription with the patient, who gave verbal consent to proceed.  History of Present Illness   Nathaniel Colon is a 63 year old male with sleep apnea who presents for a readiness check for Inspire titration.  He has been using the Inspire device and recently increased the level to seven. He manages the settings independently and notes a benefit from the device, although he experiences some discomfort for a couple of days after increasing the level. Some nights, frequent bathroom trips prevent adequate sleep.  He  uses oxygen at night at a rate of two liters. Previously, he took Lunesta  at a dose of two mg but found it too sedating. He has since adjusted his regimen, currently taking Lunesta  without melatonin, and reports improved sleep without grogginess, waking up around 6:30 to 7:00 AM and staying awake throughout the day.  He continues to use Trelegy once a day for his breathing and occasionally uses a rescue inhaler, approximately once every other week, when experiencing difficulty breathing. No recent respiratory infections or colored mucus.  He experiences nocturia, waking up at night to use the restroom frequently, which affects his sleep. He is taking tamsulosin  (Flomax ) as prescribed. He also experiences pain at times, which can wake him up at night.      Sleep Sync compliance report Total nights used /30 days () greater than 4 hours Average hours per night used  Average pauses per night  Current  amplitude 1.3 voltage (upper limit 1.7V/lower limit 0.7V)   05/14/2024- Interim hx  Discussed the use of AI scribe software for clinical note transcription with the patient, who gave verbal consent to proceed.  History of Present Illness Nathaniel Colon is a 63 year old male with sleep apnea who presents with difficulty sleeping and issues with his Inspire device settings. He had a sleep study done through the [facility] which showed severe OSA with AHI 71.5/hr.  He underwent Inspire titration study on 04/22/24, inspire titration to 1.6V with residual AHI (4%) 14.6/hour with oxygen saturation nadir 86%. Inspire settings were adjusted during his titration study with range 1.3-1.6. Current level 2 (1.4V).   He has been experiencing difficulty sleeping since his last in-lab sleep study, where he initially slept well. Despite sleeping well during the titration study, he has not slept much at all since then. His usage of the Inspire device is only five hours per week, and he has been unable to sleep effectively. The Inspire device, when set at a higher level, causes discomfort, particularly in his tongue, which wakes him up.   He has not been taking his prescribed sleep medications, including Lunesta , due to side effects such as grogginess and brain fog. He has also stopped taking gabapentin , which was prescribed for neuropathy in his feet, as it did not provide relief and contributed to his cognitive side effects.  He has been trying to manage his sleep by taking naps during the day when possible. He frequently resets the device, with a start delay of an hour and fifteen minutes and a pause time of twenty-five minutes, in an attempt to fall asleep. He prefers not to sleep than to experience the side effects of the medications he was previously taking.   No Known Allergies  Immunization History  Administered Date(s) Administered   Influenza,inj,Quad PF,6+ Mos 06/21/2021, 07/15/2022   Moderna Sars-Covid-2  Vaccination 11/18/2019, 12/16/2019   PNEUMOCOCCAL CONJUGATE-20 06/21/2021   Tdap 08/13/2015   Zoster Recombinant(Shingrix) 04/28/2020, 10/25/2020    Past Medical History:  Diagnosis Date   Anginal pain    Arthritis    Chronic kidney disease    Complication of anesthesia    patient reports hard to wake up after having EGD   COPD (chronic obstructive pulmonary disease) (HCC)    Coronary artery disease    Depression    Diabetes mellitus    Diabetic gastroparesis (HCC)    Dyspnea    GERD (gastroesophageal reflux disease)    H/O hiatal hernia    Hypertension    Neuromuscular disorder (HCC)  MUSCLE SPASMS   Peripheral vascular disease    PERIPHERAL NEUROPATHY    Sleep apnea     Tobacco History: Social History   Tobacco Use  Smoking Status Former   Current packs/day: 0.00   Average packs/day: 0.5 packs/day for 30.0 years (15.0 ttl pk-yrs)   Types: Cigarettes   Start date: 78   Quit date: 2018   Years since quitting: 7.7  Smokeless Tobacco Never   Counseling given: Not Answered   Outpatient Medications Prior to Visit  Medication Sig Dispense Refill   albuterol  (VENTOLIN  HFA) 108 (90 Base) MCG/ACT inhaler Inhale 2 puffs into the lungs every 6 (six) hours as needed for wheezing or shortness of breath. 8 g 3   aluminum hydroxide-magnesium carbonate (ACID GONE) 95-358 MG/15ML SUSP Take 30 mLs by mouth 4 (four) times daily as needed for heartburn.     amLODipine  (NORVASC ) 5 MG tablet Take 1 tablet (5 mg total) by mouth daily. 30 tablet 2   aspirin  EC 81 MG EC tablet Take 1 tablet (81 mg total) by mouth daily. 30 tablet 0   bethanechol (URECHOLINE) 10 MG tablet Take 10 mg by mouth 3 (three) times daily.     capsaicin (ZOSTRIX) 0.025 % cream Apply 1 Application topically 2 (two) times daily as needed (foot pain).     carboxymethylcellulose (REFRESH PLUS) 0.5 % SOLN Place 1-2 drops into both eyes as needed (irritation).     cefUROXime (CEFTIN) 500 MG tablet Take by mouth.      cholecalciferol  (VITAMIN D3) 25 MCG (1000 UNIT) tablet Take 1,000 Units by mouth daily.     diclofenac  Sodium (VOLTAREN ) 1 % GEL Apply 1 Application topically 2 (two) times daily as needed (back pain).     DULoxetine  (CYMBALTA ) 60 MG capsule Take 60 mg by mouth daily.     esomeprazole (NEXIUM) 40 MG capsule Take 40 mg by mouth 2 (two) times daily before a meal.     eszopiclone  (LUNESTA ) 2 MG TABS tablet Take 1 tablet (2 mg total) by mouth at bedtime as needed for sleep. Take immediately before bedtime 30 tablet 2   famotidine (PEPCID) 40 MG tablet Take 40 mg by mouth at bedtime.     ferrous sulfate  325 (65 FE) MG tablet Take 1 tablet (325 mg total) by mouth 3 (three) times a week. Take on Monday, Wednesday, and friday 100 tablet 3   finasteride  (PROSCAR ) 5 MG tablet Take 1 tablet (5 mg total) by mouth daily. 30 tablet 2   Fluticasone -Umeclidin-Vilant (TRELEGY ELLIPTA ) 100-62.5-25 MCG/ACT AEPB Inhale 1 puff into the lungs in the morning. 60 each 11   gabapentin  (NEURONTIN ) 400 MG capsule Take 2 capsules (800 mg total) by mouth 2 (two) times daily. (Patient taking differently: Take 400-800 mg by mouth See admin instructions. Take 400 mg twice daily and 800 mg at bedtime) 60 capsule 2   Glucagon, rDNA, (GLUCAGON EMERGENCY) 1 MG KIT Inject 1 mg into the vein as needed (low blood sugar).     glucose 4 GM chewable tablet Chew 2 tablets by mouth as needed for low blood sugar.     HYDROcodone -acetaminophen  (NORCO/VICODIN) 5-325 MG tablet Take 1-2 tablets by mouth every 6 (six) hours as needed for moderate pain (pain score 4-6). 12 tablet 0   Insulin  Aspart FlexPen (NOVOLOG ) 100 UNIT/ML Inject 35-50 Units into the skin in the morning, at noon, and at bedtime.     insulin  glargine-yfgn (SEMGLEE) 100 UNIT/ML injection Inject 40 Units into the skin 2 (  two) times daily.     lidocaine  (LIDODERM ) 5 % Place 1 patch onto the skin daily as needed (pain). Apply to back     Melatonin 3 MG CAPS Take 6 mg by mouth at  bedtime.     metoCLOPramide  (REGLAN ) 10 MG tablet Take 5 mg by mouth in the morning and at bedtime.     metoprolol  succinate (TOPROL -XL) 50 MG 24 hr tablet Take 0.5 tablets (25 mg total) by mouth daily. 30 tablet 2   morphine  (MSIR) 15 MG tablet Take 0.5 tablets (7.5 mg total) by mouth every 4 (four) hours as needed for severe pain (pain score 7-10). 5 tablet 0   nitroGLYCERIN  (NITROSTAT ) 0.4 MG SL tablet Place 0.4 mg under the tongue every 5 (five) minutes as needed for chest pain.     rosuvastatin  (CRESTOR ) 40 MG tablet Take 20 mg by mouth daily.     senna-docusate (SENOKOT-S) 8.6-50 MG tablet Take 2 tablets by mouth at bedtime.     sitaGLIPtin (JANUVIA) 50 MG tablet Take 50 mg by mouth daily.     sucralfate (CARAFATE) 1 GM/10ML suspension Take 1 g by mouth 4 (four) times daily as needed (acid reflux).     tamsulosin  (FLOMAX ) 0.4 MG CAPS capsule Take 2 capsules (0.8 mg total) by mouth at bedtime. 60 capsule 2   vitamin B-12 (CYANOCOBALAMIN ) 500 MCG tablet Take 1,000 mcg by mouth daily.     No facility-administered medications prior to visit.    Review of Systems  Review of Systems  Constitutional:  Positive for fatigue.  Respiratory: Negative.    Psychiatric/Behavioral:  Positive for sleep disturbance.    Physical Exam  BP 128/68   Pulse 96   Temp 97.7 F (36.5 C)   Ht 6' 2 (1.88 m)   Wt 214 lb 3.2 oz (97.2 kg)   SpO2 94% Comment: ra  BMI 27.50 kg/m  Physical Exam Constitutional:      Appearance: Normal appearance. He is well-developed.  HENT:     Head: Normocephalic and atraumatic.     Mouth/Throat:     Mouth: Mucous membranes are moist.     Pharynx: Oropharynx is clear.  Cardiovascular:     Rate and Rhythm: Normal rate and regular rhythm.     Heart sounds: Normal heart sounds.  Pulmonary:     Effort: Pulmonary effort is normal. No respiratory distress.     Breath sounds: Normal breath sounds. No wheezing or rhonchi.     Comments: 02 94% RA Musculoskeletal:         General: Normal range of motion.     Cervical back: Normal range of motion and neck supple.  Skin:    General: Skin is warm and dry.     Findings: No erythema or rash.  Neurological:     General: No focal deficit present.     Mental Status: He is alert and oriented to person, place, and time. Mental status is at baseline.  Psychiatric:        Mood and Affect: Mood normal.        Behavior: Behavior normal.        Thought Content: Thought content normal.        Judgment: Judgment normal.      Lab Results:  CBC    Component Value Date/Time   WBC 8.2 07/29/2023 1345   RBC 5.66 07/29/2023 1345   HGB 16.3 07/29/2023 1457   HCT 48.0 07/29/2023 1457   PLT 241 07/29/2023 1345  MCV 89.6 07/29/2023 1345   MCH 28.8 07/29/2023 1345   MCHC 32.1 07/29/2023 1345   RDW 14.3 07/29/2023 1345    BMET    Component Value Date/Time   NA 135 07/29/2023 1457   K 4.2 07/29/2023 1457   CL 98 07/29/2023 1457   CO2 25 04/17/2023 0823   GLUCOSE 367 (H) 07/29/2023 1457   BUN 21 07/29/2023 1457   CREATININE 1.60 (H) 07/29/2023 1457   CALCIUM  9.0 04/17/2023 0823   GFRNONAA 48 (L) 04/17/2023 0823   GFRAA >60 03/15/2016 1253    BNP    Component Value Date/Time   BNP 19.0 06/18/2022 1203    ProBNP No results found for: PROBNP  Imaging: No results found.   Assessment & Plan:   1. Chronic hypoxic respiratory failure (HCC) - Flu vaccine trivalent PF, 6mos and older(Flulaval,Afluria,Fluarix,Fluzone)  2. Insomnia, unspecified type - Flu vaccine trivalent PF, 6mos and older(Flulaval,Afluria,Fluarix,Fluzone)  3. OSA (obstructive sleep apnea) (Primary)  Assessment and Plan Assessment & Plan Obstructive sleep apnea managed with hypoglossal nerve stimulator (Inspire) Severe OSA, AHI 71.5/hour. Recent issues with device settings leading to poor sleep quality. Currently on level 2 (1.4V). Optimal level found during sleep study was 1.6V with reduction in AHI 14/hour. Current usage is low,  with only five hours per week. - Set outgoing level to one (1.3 amplitude) with a range of 1.3 to 1.7. - Instruct to increase to level two next Tuesday if tolerable, then to level three the following week. - Aim to reach level four, which is his optimal level(1.6V) - FU compliance in 6 weeks   Insomnia  Insomnia likely exacerbated by suboptimal Inspire settings and previous medication regimen. Discontinued Lunesta  and gabapentin  due to side effects including grogginess and brain fog. Open to trying a lower dose of Lunesta  without gabapentin . - Prescribe lower dose of Lunesta , one mg nightly. - Advise not to take Lunesta  with other medications. - Encourage usage of Inspire device to improve sleep quality.  Peripheral neuropathy Previously managed with gabapentin , which was discontinued due to lack of efficacy and side effects. - Continue to stay off gabapentin .    Almarie LELON Ferrari, NP 05/18/2024

## 2024-05-18 NOTE — Patient Instructions (Addendum)
  VISIT SUMMARY: During your visit, we discussed your ongoing sleep difficulties and issues with your Inspire device settings. We also reviewed your experience with sleep medications and your decision to stop taking gabapentin  for neuropathy.  YOUR PLAN: -OBSTRUCTIVE SLEEP APNEA: Obstructive sleep apnea is a condition where your airway becomes blocked during sleep, causing breathing interruptions. We have adjusted your Inspire device settings to start at level one (1.3 amplitude) and gradually increase to level four over the next month. Increase the setting to level two next Tuesday if tolerable, then to level three the following week. Aim to reach level four, but back off if it becomes uncomfortable. Do not increase the setting sooner than a week. Make sure to wear oxygen at night.   -INSOMNIA ASSOCIATED WITH OBSTRUCTIVE SLEEP APNEA: Insomnia is difficulty falling or staying asleep. Your insomnia is likely worsened by the suboptimal settings of your Inspire device and previous medication side effects. We have prescribed a lower dose of Lunesta  (one mg nightly) to help with sleep. Do not take Lunesta  with other medications and continue using the Inspire device to improve sleep quality.  -PERIPHERAL NEUROPATHY: Peripheral neuropathy is a condition that causes weakness, numbness, and pain in your hands and feet. You have stopped taking gabapentin  due to its side effects and lack of effectiveness. Continue to stay off gabapentin .  INSTRUCTIONS: Please follow the new Inspire device settings schedule and increase the level gradually as instructed. Take the lower dose of Lunesta  (one mg) nightly and avoid taking it with other medications. Continue to stay off gabapentin . If you experience any issues or have questions, please contact our office.  Orders: Flu shot today   Follow-up Carren will call you to set up a visit in 6 weeks

## 2024-05-26 ENCOUNTER — Emergency Department (HOSPITAL_COMMUNITY)
Admission: EM | Admit: 2024-05-26 | Discharge: 2024-05-26 | Disposition: A | Discharge status: Home / Self Care | Attending: Emergency Medicine | Admitting: Emergency Medicine

## 2024-05-26 ENCOUNTER — Other Ambulatory Visit: Payer: Self-pay

## 2024-05-26 ENCOUNTER — Encounter (HOSPITAL_COMMUNITY): Payer: Self-pay | Admitting: *Deleted

## 2024-05-26 DIAGNOSIS — Z7982 Long term (current) use of aspirin: Secondary | ICD-10-CM | POA: Insufficient documentation

## 2024-05-26 DIAGNOSIS — R350 Frequency of micturition: Secondary | ICD-10-CM | POA: Diagnosis present

## 2024-05-26 DIAGNOSIS — Z794 Long term (current) use of insulin: Secondary | ICD-10-CM | POA: Insufficient documentation

## 2024-05-26 DIAGNOSIS — Z7984 Long term (current) use of oral hypoglycemic drugs: Secondary | ICD-10-CM | POA: Diagnosis not present

## 2024-05-26 DIAGNOSIS — Z87891 Personal history of nicotine dependence: Secondary | ICD-10-CM | POA: Diagnosis not present

## 2024-05-26 DIAGNOSIS — E871 Hypo-osmolality and hyponatremia: Secondary | ICD-10-CM | POA: Insufficient documentation

## 2024-05-26 DIAGNOSIS — N390 Urinary tract infection, site not specified: Secondary | ICD-10-CM | POA: Insufficient documentation

## 2024-05-26 DIAGNOSIS — B9689 Other specified bacterial agents as the cause of diseases classified elsewhere: Secondary | ICD-10-CM | POA: Diagnosis not present

## 2024-05-26 DIAGNOSIS — E1165 Type 2 diabetes mellitus with hyperglycemia: Secondary | ICD-10-CM | POA: Insufficient documentation

## 2024-05-26 DIAGNOSIS — D72819 Decreased white blood cell count, unspecified: Secondary | ICD-10-CM | POA: Insufficient documentation

## 2024-05-26 DIAGNOSIS — R739 Hyperglycemia, unspecified: Secondary | ICD-10-CM

## 2024-05-26 LAB — COMPREHENSIVE METABOLIC PANEL WITH GFR
ALT: 114 U/L — ABNORMAL HIGH (ref 0–44)
AST: 71 U/L — ABNORMAL HIGH (ref 15–41)
Albumin: 3.3 g/dL — ABNORMAL LOW (ref 3.5–5.0)
Alkaline Phosphatase: 166 U/L — ABNORMAL HIGH (ref 38–126)
Anion gap: 12 (ref 5–15)
BUN: 16 mg/dL (ref 8–23)
CO2: 24 mmol/L (ref 22–32)
Calcium: 8.9 mg/dL (ref 8.9–10.3)
Chloride: 96 mmol/L — ABNORMAL LOW (ref 98–111)
Creatinine, Ser: 1.75 mg/dL — ABNORMAL HIGH (ref 0.61–1.24)
GFR, Estimated: 44 mL/min — ABNORMAL LOW (ref >60)
Glucose, Bld: 422 mg/dL — ABNORMAL HIGH (ref 70–99)
Potassium: 3.9 mmol/L (ref 3.5–5.1)
Sodium: 132 mmol/L — ABNORMAL LOW (ref 135–145)
Total Bilirubin: 0.9 mg/dL (ref 0.0–1.2)
Total Protein: 7 g/dL (ref 6.5–8.1)

## 2024-05-26 LAB — URINALYSIS, W/ REFLEX TO CULTURE (INFECTION SUSPECTED)
Bilirubin Urine: NEGATIVE
Glucose, UA: >=500 mg/dL — AB
Ketones, ur: NEGATIVE mg/dL
Nitrite: NEGATIVE
Protein, ur: 30 mg/dL — AB
Specific Gravity, Urine: 1.023 (ref 1.005–1.030)
WBC, UA: >50 WBC/hpf (ref 0–5)
pH: 5 (ref 5.0–8.0)

## 2024-05-26 LAB — CBC WITH DIFFERENTIAL/PLATELET
Abs Immature Granulocytes: 0.08 K/uL — ABNORMAL HIGH (ref 0.00–0.07)
Basophils Absolute: 0.1 K/uL (ref 0.0–0.1)
Basophils Relative: 1 %
Eosinophils Absolute: 0.2 K/uL (ref 0.0–0.5)
Eosinophils Relative: 2 %
HCT: 48.1 % (ref 39.0–52.0)
Hemoglobin: 15.6 g/dL (ref 13.0–17.0)
Immature Granulocytes: 1 %
Lymphocytes Relative: 26 %
Lymphs Abs: 2.2 K/uL (ref 0.7–4.0)
MCH: 29.9 pg (ref 26.0–34.0)
MCHC: 32.4 g/dL (ref 30.0–36.0)
MCV: 92.3 fL (ref 80.0–100.0)
Monocytes Absolute: 0.7 K/uL (ref 0.1–1.0)
Monocytes Relative: 8 %
Neutro Abs: 5.5 K/uL (ref 1.7–7.7)
Neutrophils Relative %: 62 %
Platelets: 272 K/uL (ref 150–400)
RBC: 5.21 MIL/uL (ref 4.22–5.81)
RDW: 14.3 % (ref 11.5–15.5)
WBC: 8.8 K/uL (ref 4.0–10.5)
nRBC: 0 % (ref 0.0–0.2)

## 2024-05-26 LAB — CBG MONITORING, ED
Glucose-Capillary: 149 mg/dL — ABNORMAL HIGH (ref 70–99)
Glucose-Capillary: 173 mg/dL — ABNORMAL HIGH (ref 70–99)

## 2024-05-26 LAB — I-STAT VENOUS BLOOD GAS, ED
Acid-Base Excess: 1 mmol/L (ref 0.0–2.0)
Bicarbonate: 26.6 mmol/L (ref 20.0–28.0)
Calcium, Ion: 1.15 mmol/L (ref 1.15–1.40)
HCT: 48 % (ref 39.0–52.0)
Hemoglobin: 16.3 g/dL (ref 13.0–17.0)
O2 Saturation: 54 %
Potassium: 4 mmol/L (ref 3.5–5.1)
Sodium: 133 mmol/L — ABNORMAL LOW (ref 135–145)
TCO2: 28 mmol/L (ref 22–32)
pCO2, Ven: 43.9 mmHg — ABNORMAL LOW (ref 44–60)
pH, Ven: 7.39 (ref 7.25–7.43)
pO2, Ven: 29 mmHg — CL (ref 32–45)

## 2024-05-26 MED ORDER — CEFUROXIME AXETIL 250 MG PO TABS
500.0000 mg | ORAL_TABLET | Freq: Once | ORAL | Status: AC
  Administered 2024-05-26: 500 mg via ORAL
  Filled 2024-05-26: qty 2

## 2024-05-26 MED ORDER — CEFUROXIME AXETIL 500 MG PO TABS: 500.0000 mg | ORAL_TABLET | Freq: Two times a day (BID) | ORAL | 0 refills | Status: AC

## 2024-05-26 MED ORDER — LACTATED RINGERS IV BOLUS
1000.0000 mL | Freq: Once | INTRAVENOUS | Status: AC
  Administered 2024-05-26: 1000 mL via INTRAVENOUS

## 2024-05-26 MED ORDER — LACTATED RINGERS IV BOLUS: 20.0000 mL/kg | Freq: Once | INTRAVENOUS | Status: DC

## 2024-05-26 NOTE — Discharge Instructions (Addendum)
 Please contact your endocrinologist at the [facility] to schedule follow-up in regard to your uncontrolled blood sugars as soon as possible.  Continue your insulin  regimen as previously directed.  Continue glipizide daily as previously directed.  You were also found to have a urinary tract infection, start Ceftin, 1 tablet by mouth twice daily for 7 days.  You were given the first dose of this antibiotic in the emergency department this evening.  Return to the emergency department if your symptoms worsen.

## 2024-05-26 NOTE — ED Triage Notes (Signed)
 Patient in ED today with complaints of hyperglycemia and possible UTI.   He states his blood sugar has been high since last night and is reading higher than the meter will read.  UTI symptoms have been off and on lately and he states that his urine has a odor and frequent urination. Denies pain.

## 2024-05-26 NOTE — ED Provider Triage Note (Signed)
 Emergency Medicine Provider Triage Evaluation Note  Nathaniel Colon , a 63 y.o. male  was evaluated in triage.  Pt complains of high blood sugar, UTI symptoms.  Review of Systems  Positive: Ur. Freq., glucose monitor reading high Negative: Vomiting, fever, pain  Physical Exam  BP 136/74 (BP Location: Right Arm)   Pulse 96   Temp 98.1 F (36.7 C)   Resp 18   Ht 6' 2 (1.88 m)   Wt 97.2 kg   SpO2 96%   BMI 27.51 kg/m  Gen:   Awake, no distress   Resp:  Normal effort  MSK:   Moves extremities without difficulty  Other:    Medical Decision Making  Medically screening exam initiated at 3:39 PM.  Appropriate orders placed.  EMILLIO NGO was informed that the remainder of the evaluation will be completed by another provider, this initial triage assessment does not replace that evaluation, and the importance of remaining in the ED until their evaluation is complete.  Patient in NAD, no confusion, no vomiting. Reports blood sugar reading high on machine. Reports concern for UTI with frequency and burning. Brother at bedside - reports normal mental status.    Odell Balls, PA-C 05/26/24 1541

## 2024-05-26 NOTE — ED Provider Notes (Addendum)
 [facility] Provider Note   CSN: [id number] Arrival date & time: 05/26/24  1457     Patient presents with: No chief complaint on file.   Nathaniel Colon is a 63 y.o. male.   63 year old male presenting with hyperglycemia.  Patient reports history of poorly controlled type 2 diabetes, he is on 48 units of long-acting insulin  twice daily and is on a sliding scale of short acting insulin , with his sliding scale beginning at 38 units.  He notes that his blood glucose levels are consistently high and have been reading high on his monitor for several days.  He expresses displeasure with his endocrinologist and does not feel that his diabetes is being managed well.  He was seen in the emergency department on 10/1 for hyperglycemia as well, is also on glipizide.  He was hospitalized last month for a UTI.  Endorses urinary frequency with a strong odor, denies dysuria.  Denies chest pain, shortness of breath, nausea/vomiting.        Prior to Admission medications   Medication Sig Start Date End Date Taking? Authorizing Provider  albuterol  (VENTOLIN  HFA) 108 (90 Base) MCG/ACT inhaler Inhale 2 puffs into the lungs every 6 (six) hours as needed for wheezing or shortness of breath. 09/19/22   Hunsucker, Donnice JONELLE, MD  aluminum hydroxide-magnesium carbonate (ACID GONE) 95-358 MG/15ML SUSP Take 30 mLs by mouth 4 (four) times daily as needed for heartburn.    [provider]  amLODipine  (NORVASC ) 5 MG tablet Take 1 tablet (5 mg total) by mouth daily. 04/17/23 05/18/24  Pokhrel, Vernal, MD  aspirin  EC 81 MG EC tablet Take 1 tablet (81 mg total) by mouth daily. 03/16/16   Patel, Sona, MD  bethanechol (URECHOLINE) 10 MG tablet Take 10 mg by mouth 3 (three) times daily. 11/12/23   [provider]  capsaicin (ZOSTRIX) 0.025 % cream Apply 1 Application topically 2 (two) times daily as needed (foot pain).    [provider]   carboxymethylcellulose (REFRESH PLUS) 0.5 % SOLN Place 1-2 drops into both eyes as needed (irritation).    [provider]  cefUROXime (CEFTIN) 500 MG tablet Take by mouth. 08/07/23   [provider]  cholecalciferol  (VITAMIN D3) 25 MCG (1000 UNIT) tablet Take 1,000 Units by mouth daily.    [provider]  diclofenac  Sodium (VOLTAREN ) 1 % GEL Apply 1 Application topically 2 (two) times daily as needed (back pain).    [provider]  DULoxetine  (CYMBALTA ) 60 MG capsule Take 60 mg by mouth daily.    [provider]  esomeprazole (NEXIUM) 40 MG capsule Take 40 mg by mouth 2 (two) times daily before a meal.    [provider]  eszopiclone  (LUNESTA ) 1 MG TABS tablet Take 1 tablet (1 mg total) by mouth at bedtime as needed for sleep. Take immediately before bedtime 05/18/24   Hope Almarie ORN, NP  famotidine (PEPCID) 40 MG tablet Take 40 mg by mouth at bedtime.    [provider]  ferrous sulfate  325 (65 FE) MG tablet Take 1 tablet (325 mg total) by mouth 3 (three) times a week. Take on Monday, Wednesday, and friday 04/18/23   Pokhrel, Laxman, MD  finasteride  (PROSCAR ) 5 MG tablet Take 1 tablet (5 mg total) by mouth daily. 04/17/23   Pokhrel, Laxman, MD  Fluticasone -Umeclidin-Vilant (TRELEGY ELLIPTA ) 100-62.5-25 MCG/ACT AEPB Inhale 1 puff into the lungs in the morning. 09/17/23   Hunsucker, Donnice JONELLE, MD  gabapentin  (NEURONTIN ) 400 MG capsule Take 2 capsules (800 mg total) by mouth 2 (two) times daily. Patient taking differently: Take 400-800 mg by mouth See admin instructions. Take 400 mg twice daily and 800 mg at bedtime 04/17/23   Pokhrel, Laxman, MD  Glucagon, rDNA, (GLUCAGON EMERGENCY) 1 MG KIT Inject 1 mg into the vein as needed (low blood sugar).    [provider]  glucose 4 GM chewable tablet Chew 2 tablets by mouth as needed for low blood sugar.    [provider]  HYDROcodone -acetaminophen  (NORCO/VICODIN) 5-325 MG  tablet Take 1-2 tablets by mouth every 6 (six) hours as needed for moderate pain (pain score 4-6). 08/25/23 08/24/24  Carlie Clark, MD  Insulin  Aspart FlexPen (NOVOLOG ) 100 UNIT/ML Inject 35-50 Units into the skin in the morning, at noon, and at bedtime.    [provider]  insulin  glargine-yfgn (SEMGLEE) 100 UNIT/ML injection Inject 40 Units into the skin 2 (two) times daily.    [provider]  lidocaine  (LIDODERM ) 5 % Place 1 patch onto the skin daily as needed (pain). Apply to back    [provider]  Melatonin 3 MG CAPS Take 6 mg by mouth at bedtime.    [provider]  metoCLOPramide  (REGLAN ) 10 MG tablet Take 5 mg by mouth in the morning and at bedtime.    [provider]  metoprolol  succinate (TOPROL -XL) 50 MG 24 hr tablet Take 0.5 tablets (25 mg total) by mouth daily. 04/17/23   Pokhrel, Laxman, MD  morphine  (MSIR) 15 MG tablet Take 0.5 tablets (7.5 mg total) by mouth every 4 (four) hours as needed for severe pain (pain score 7-10). 12/09/23   Emil Share, DO  nitroGLYCERIN  (NITROSTAT ) 0.4 MG SL tablet Place 0.4 mg under the tongue every 5 (five) minutes as needed for chest pain.    [provider]  rosuvastatin  (CRESTOR ) 40 MG tablet Take 20 mg by mouth daily.    [provider]  senna-docusate (SENOKOT-S) 8.6-50 MG tablet Take 2 tablets by mouth at bedtime.    [provider]  sitaGLIPtin (JANUVIA) 50 MG tablet Take 50 mg by mouth daily.    [provider]  sucralfate (CARAFATE) 1 GM/10ML suspension Take 1 g by mouth 4 (four) times daily as needed (acid reflux).    [provider]  tamsulosin  (FLOMAX ) 0.4 MG CAPS capsule Take 2 capsules (0.8 mg total) by mouth at bedtime. 04/17/23   Pokhrel, Laxman, MD  vitamin B-12 (CYANOCOBALAMIN ) 500 MCG tablet Take 1,000 mcg by mouth daily.    [provider]    Allergies: Patient has no known allergies.    Review of Systems  Updated Vital Signs  Vitals:    05/26/24 1502 05/26/24 1537 05/26/24 1923  BP: 136/74  107/73  Pulse: 96  87  Resp: 18  18  Temp: 98.1 F (36.7 C)  98.2 F (36.8 C)  SpO2: 96%  94%  Weight:  97.2 kg   Height:  6' 2 (1.88 m)      Physical Exam Vitals and nursing note reviewed.  Constitutional:      General: He is not in acute distress. HENT:     Head: Normocephalic.  Eyes:     Extraocular Movements: Extraocular movements intact.  Cardiovascular:     Rate and Rhythm: Normal rate and regular rhythm.  Pulmonary:     Effort: Pulmonary effort is normal.     Breath sounds: Normal breath sounds.  Abdominal:  Palpations: Abdomen is soft.     Tenderness: There is no abdominal tenderness. There is no guarding.  Musculoskeletal:     Cervical back: Normal range of motion.     Comments: Moves all extremities spontaneously without difficulty  Skin:    General: Skin is warm and dry.  Neurological:     Mental Status: He is alert and oriented to person, place, and time.     (all labs ordered are listed, but only abnormal results are displayed) Labs Reviewed  CBC WITH DIFFERENTIAL/PLATELET - Abnormal; Notable for the following components:      Result Value   Abs Immature Granulocytes 0.08 (*)    All other components within normal limits  COMPREHENSIVE METABOLIC PANEL WITH GFR - Abnormal; Notable for the following components:   Sodium 132 (*)    Chloride 96 (*)    Glucose, Bld 422 (*)    Creatinine, Ser 1.75 (*)    Albumin 3.3 (*)    AST 71 (*)    ALT 114 (*)    Alkaline Phosphatase 166 (*)    GFR, Estimated 44 (*)    All other components within normal limits  URINALYSIS, W/ REFLEX TO CULTURE (INFECTION SUSPECTED) - Abnormal; Notable for the following components:   APPearance CLOUDY (*)    Glucose, UA >=500 (*)    Hgb urine dipstick SMALL (*)    Protein, ur 30 (*)    Leukocytes,Ua LARGE (*)    Bacteria, UA RARE (*)    All other components within normal limits  I-STAT VENOUS BLOOD GAS, ED -  Abnormal; Notable for the following components:   pCO2, Ven 43.9 (*)    pO2, Ven 29 (*)    Sodium 133 (*)    All other components within normal limits  URINE CULTURE  CBG MONITORING, ED    EKG: None  Radiology: No results found.   Procedures   Medications Ordered in the ED  cefUROXime (CEFTIN) tablet 500 mg (has no administration in time range)  lactated ringers  bolus 1,000 mL (0 mLs Intravenous Stopped 05/26/24 2236)                                    Medical Decision Making This patient presents to the ED for concern of hyperglycemia and concern for UTI, this involves an extensive number of treatment options, and is a complaint that carries with it a high risk of complications and morbidity.  The differential diagnosis includes DKA, HHS, poorly controlled type 2 diabetes, urinary tract infection   Co morbidities that complicate the patient evaluation  poorly controlled type 2 diabetes   Additional history obtained:  Additional history obtained from record review External records from outside source obtained and reviewed including prior ED note from 10/1, prior hospital discharge summary   Lab Tests:  I Ordered, and personally interpreted labs.  The pertinent results include: CBC unremarkable.  CMP notable for borderline hyponatremia with sodium of 132, blood glucose elevated at 422, creatinine of 1.75 is largely consistent with patient's baseline, found to have new elevations in AST/ALT/alk phos.  Urinalysis is notable for glucosuria with small RBCs, protein with large leukocytes, > 50 WBC's with clumps are present, will send for culture.  VBG is without acidosis.   Cardiac Monitoring: / EKG:  The patient was maintained on a cardiac monitor.  I personally viewed and interpreted the cardiac monitored which showed an underlying rhythm  of: NSR  Problem List / ED Course / Critical interventions / Medication management  I ordered medication including LR fluid bolus for  rehydration, Ceftin for first dose of antibiotics for UTI I have reviewed the patients home medicines and have made adjustments as needed   Social Determinants of Health:  Former tobacco use   Test / Admission - Considered:  At time of presentation today, patient was found to have a CBG of 422.  However, at time of my initial assessment patient shows me a CBG on his monitor of~130, this was then rechecked by nursing staff and was found to be 149.  Patient admits that while waiting in the emergency department today he has administered approximately 150 units of short acting insulin  in accordance with his sliding scale, at this time the patient does not require further administration of insulin  as I am concerned that he may have a hypoglycemic event.  I provided the patient with orange juice to prevent episode of hypoglycemia.  Given these reassuring findings as above, patient is not in DKA/HHS. Patient does have a urinary tract infection and has been hospitalized for urinary tract infections previously.  Will rehydrate with 1 L of LR and reassess his CBG, monitoring closely to avoid hypoglycemia. CBG continues to be in an adequate range, prior to discharge CBG was 173.  Based on these reassuring findings patient does not warrant administration of any further insulin .  I discussed in depth with him that he needs to be seen by his endocrinologist at the [facility], as it does not seem that his current insulin  regiment is adequately controlling his blood sugars.  He voiced understanding and is in agreement this plan, plans to contact his provider at the [facility] tomorrow.  Patient was given the first dose of his antibiotic in the emergency department this evening, with the remainder of his 7-day course being sent to his preferred pharmacy at the [facility]. patient has had elevated AST/ALT/alkaline phosphatase in the past based on my review of his previous labs that were done at prior ED visits/hospital admissions, I recommend  that he discuss this with his PCP at follow-up.  Strict return precautions discussed, he voiced understanding is in agreement this plan and is appropriate for discharge at this time.   Risk Prescription drug management.        Final diagnoses:  Hyperglycemia  Urinary tract infection without hematuria, site unspecified    ED Discharge Orders     None             Glendia Rocky LOISE DEVONNA 05/26/24 2329    Francesca Elsie CROME, MD 05/28/24 1900

## 2024-05-28 LAB — URINE CULTURE: Culture: >=100000 — AB

## 2024-05-29 ENCOUNTER — Telehealth (HOSPITAL_BASED_OUTPATIENT_CLINIC_OR_DEPARTMENT_OTHER): Payer: Self-pay | Admitting: *Deleted

## 2024-05-29 ENCOUNTER — Encounter (HOSPITAL_COMMUNITY): Payer: Self-pay

## 2024-05-29 ENCOUNTER — Other Ambulatory Visit: Payer: Self-pay

## 2024-05-29 ENCOUNTER — Emergency Department (HOSPITAL_COMMUNITY)
Admission: EM | Admit: 2024-05-29 | Discharge: 2024-05-29 | Disposition: A | Discharge status: Home / Self Care | Attending: Emergency Medicine | Admitting: Emergency Medicine

## 2024-05-29 DIAGNOSIS — N3090 Cystitis, unspecified without hematuria: Secondary | ICD-10-CM | POA: Diagnosis not present

## 2024-05-29 DIAGNOSIS — R112 Nausea with vomiting, unspecified: Secondary | ICD-10-CM | POA: Diagnosis present

## 2024-05-29 DIAGNOSIS — E1165 Type 2 diabetes mellitus with hyperglycemia: Secondary | ICD-10-CM | POA: Insufficient documentation

## 2024-05-29 DIAGNOSIS — R739 Hyperglycemia, unspecified: Secondary | ICD-10-CM

## 2024-05-29 DIAGNOSIS — Z794 Long term (current) use of insulin: Secondary | ICD-10-CM | POA: Insufficient documentation

## 2024-05-29 DIAGNOSIS — D72829 Elevated white blood cell count, unspecified: Secondary | ICD-10-CM | POA: Diagnosis not present

## 2024-05-29 DIAGNOSIS — Z7982 Long term (current) use of aspirin: Secondary | ICD-10-CM | POA: Insufficient documentation

## 2024-05-29 LAB — COMPREHENSIVE METABOLIC PANEL WITH GFR
ALT: 52 U/L — ABNORMAL HIGH (ref 0–44)
AST: 28 U/L (ref 15–41)
Albumin: 3.2 g/dL — ABNORMAL LOW (ref 3.5–5.0)
Alkaline Phosphatase: 116 U/L (ref 38–126)
Anion gap: 14 (ref 5–15)
BUN: 22 mg/dL (ref 8–23)
CO2: 25 mmol/L (ref 22–32)
Calcium: 8.7 mg/dL — ABNORMAL LOW (ref 8.9–10.3)
Chloride: 95 mmol/L — ABNORMAL LOW (ref 98–111)
Creatinine, Ser: 1.69 mg/dL — ABNORMAL HIGH (ref 0.61–1.24)
GFR, Estimated: 46 mL/min — ABNORMAL LOW (ref >60)
Glucose, Bld: 408 mg/dL — ABNORMAL HIGH (ref 70–99)
Potassium: 4.1 mmol/L (ref 3.5–5.1)
Sodium: 134 mmol/L — ABNORMAL LOW (ref 135–145)
Total Bilirubin: 0.9 mg/dL (ref 0.0–1.2)
Total Protein: 6.9 g/dL (ref 6.5–8.1)

## 2024-05-29 LAB — CBC
HCT: 49.5 % (ref 39.0–52.0)
Hemoglobin: 15.9 g/dL (ref 13.0–17.0)
MCH: 30.6 pg (ref 26.0–34.0)
MCHC: 32.1 g/dL (ref 30.0–36.0)
MCV: 95.2 fL (ref 80.0–100.0)
Platelets: 258 K/uL (ref 150–400)
RBC: 5.2 MIL/uL (ref 4.22–5.81)
RDW: 14.4 % (ref 11.5–15.5)
WBC: 11.3 K/uL — ABNORMAL HIGH (ref 4.0–10.5)
nRBC: 0 % (ref 0.0–0.2)

## 2024-05-29 LAB — LIPASE, BLOOD: Lipase: 19 U/L (ref 11–51)

## 2024-05-29 MED ORDER — ONDANSETRON 4 MG PO TBDP
4.0000 mg | ORAL_TABLET | Freq: Once | ORAL | Status: AC | PRN
  Administered 2024-05-29: 4 mg via ORAL
  Filled 2024-05-29: qty 1

## 2024-05-29 MED ORDER — LACTATED RINGERS IV BOLUS
1000.0000 mL | Freq: Once | INTRAVENOUS | Status: AC
  Administered 2024-05-29: 1000 mL via INTRAVENOUS

## 2024-05-29 MED ORDER — SULFAMETHOXAZOLE-TRIMETHOPRIM 800-160 MG PO TABS: 1.0000 | ORAL_TABLET | Freq: Two times a day (BID) | ORAL | 0 refills | Status: AC

## 2024-05-29 MED ORDER — SULFAMETHOXAZOLE-TRIMETHOPRIM 800-160 MG PO TABS
1.0000 | ORAL_TABLET | Freq: Once | ORAL | Status: AC
  Administered 2024-05-29: 1 via ORAL
  Filled 2024-05-29: qty 1

## 2024-05-29 MED ORDER — ONDANSETRON HCL 4 MG/2ML IJ SOLN
4.0000 mg | Freq: Once | INTRAMUSCULAR | Status: AC
  Administered 2024-05-29: 4 mg via INTRAVENOUS
  Filled 2024-05-29: qty 2

## 2024-05-29 NOTE — Telephone Encounter (Signed)
 Post ED Visit - Positive Culture Follow-up  Culture report reviewed by antimicrobial stewardship pharmacist: [facility] []  Rankin Dee, Pharm.D. []  Venetia Gully, Pharm.D., BCPS AQ-ID []  Garrel Crews, Pharm.D., BCPS []  Almarie Lunger, [address].D., BCPS []  [place], [address].D., BCPS, AAHIVP []  Rosaline Bihari, Pharm.D., BCPS, AAHIVP []  Vernell Meier, PharmD, BCPS []  Latanya Hint, PharmD, BCPS []  Donald Medley, PharmD, BCPS []  Rocky Bold, PharmD []  Dorothyann Alert, PharmD, BCPS [x]  Dorn Buttner, PharmD  [facility] []  Rosaline Edison, PharmD []  Romona Bliss, PharmD []  Dolphus Roller, PharmD []  Veva Seip, Rph []  Vernell Daunt) Leonce, PharmD []  Eva Allis, PharmD []  Rosaline Millet, PharmD []  Iantha Batch, PharmD []  Arvin Gauss, PharmD []  Wanda Hasting, PharmD []  Ronal Rav, PharmD []  Rocky Slade, PharmD []  Bard Jeans, PharmD   Positive urine culture Per pharmacist above- Patient is in ED at time of culture review after returning for similar symptoms. Original abx prescribed will not be sufficient. Have recommended bactrim to EDP this morning if appropriate for PO abx. No further action is needed.   Nathaniel Colon 05/29/2024, 2:19 PM

## 2024-05-29 NOTE — ED Triage Notes (Signed)
 Pt also c.o abd pain for the past 3 days. Pt taking abx for UTI

## 2024-05-29 NOTE — ED Triage Notes (Signed)
 Pt states he started vomiting today around 0100 after starting cefuroxime, states the vomit is black and brown in color.

## 2024-05-29 NOTE — Discharge Instructions (Addendum)
 Nathaniel Colon  Thank you for allowing us  to take care of you today.  You came to the Emergency Department today because you were recently diagnosed with a urinary tract infection, and you were started on a antibiotic, however that antibiotic made you have nausea and vomiting, and you are still having your same urinary symptoms.  Unfortunately the urine culture showed that your infection will not be treated by the antibiotic that you were given (if you were not given an incorrect antibiotic, unfortunately you just had a resistant bacteria), so we are going to change you to a different antibiotic called Bactrim.  You are feeling better after getting fluids and nausea medicine here in the ED.  We will discharge you with antibiotic, as well as a nausea medicine patient called Zofran , that you can take every 8 hours as needed for nausea/vomiting.  To-Do: 1. Please follow-up with your primary doctor within 1 - 2 weeks / as soon as possible. Please have your blood sugar rechecked as well, because it was high in the ER today.  Please return to the Emergency Department or call 911 if you experience have worsening of your symptoms, or do not get better, persistent urine symptoms after 48 hours of antibiotics, chest pain, shortness of breath, severe or significantly worsening pain, high fever, severe confusion, pass out or have any reason to think that you need emergency medical care.   We hope you feel better soon.   Mitzie Later, MD Department of Emergency Medicine [facility]

## 2024-05-29 NOTE — Progress Notes (Addendum)
 ED Antimicrobial Stewardship Positive Culture Follow Up   Nathaniel Colon is an 63 y.o. male who presented to [facility] on @ADMITDT @ with a chief complaint of  Chief Complaint  Patient presents with   Emesis   Abdominal Pain    Recent Results (from the past 720 hours)  Urine Culture     Status: Abnormal   Collection Time: 05/26/24  3:42 PM   Specimen: Urine, Clean Catch  Result Value Ref Range Status   Specimen Description URINE, CLEAN CATCH  Final   Special Requests   Final    NONE Reflexed from [id number] Performed at [facility], [address], [place], [place] [address]    Culture >=100,000 COLONIES/mL STAPHYLOCOCCUS LUGDUNENSIS (A)  Final   Report Status 05/28/2024 FINAL  Final   Organism ID, Bacteria STAPHYLOCOCCUS LUGDUNENSIS (A)  Final      Susceptibility   Staphylococcus lugdunensis - MIC*    CIPROFLOXACIN <=0.5 SENSITIVE Sensitive     GENTAMICIN <=0.5 SENSITIVE Sensitive     NITROFURANTOIN 32 SENSITIVE Sensitive     OXACILLIN >=4 RESISTANT Resistant     TETRACYCLINE <=1 SENSITIVE Sensitive     VANCOMYCIN 1 SENSITIVE Sensitive     TRIMETH/SULFA <=10 SENSITIVE Sensitive     RIFAMPIN <=0.5 SENSITIVE Sensitive     Inducible Clindamycin NEGATIVE Sensitive     * >=100,000 COLONIES/mL STAPHYLOCOCCUS LUGDUNENSIS    [x]  Patient is in ED at time of culture review after returning for similar symptoms. Original abx prescribed will not be sufficient. Have recommended bactrim to EDP this morning if appropriate for PO abx. No further action is needed.   Dorn Buttner, PharmD, BCPS 05/29/2024 12:13 PM ED Clinical Pharmacist -  [phone]

## 2024-05-29 NOTE — ED Provider Notes (Signed)
 [facility] Provider Note   CSN: [id number] Arrival date & time: 05/29/24  0957     History Chief Complaint  Patient presents with   Emesis   Abdominal Pain    HPI: Nathaniel Colon is a 63 y.o. male with history pertinent for T2DM who presents complaining of nausea, vomiting, urinary frequency. Patient arrived via POV.  History provided by patient.  No interpreter required during this encounter.  Patient states that he was seen several days ago in the emergency department for urinary frequency and foul-smelling urine, denied dysuria.  Reports that he was diagnosed with a UTI and was started on an antibiotic which he reports that he has been adherent to.  Reports that despite taking his antibiotic he has had continued foul-smelling urine and urinary frequency, continues to deny dysuria.  Reports that he also has developed epigastric pain after taking the antibiotic, and after each round of antibiotic he developed nausea and vomiting.  Reports that he had approximately 5 episodes of emesis yesterday, and it was disrupting his sleep, therefore he came to the emergency department for reevaluation.  Patient's recorded medical, surgical, social, medication list and allergies were reviewed in the Snapshot window as part of the initial history.   Prior to Admission medications   Medication Sig Start Date End Date Taking? Authorizing Provider  sulfamethoxazole-trimethoprim (BACTRIM DS) 800-160 MG tablet Take 1 tablet by mouth 2 (two) times daily for 7 days. 05/29/24 06/05/24 Yes Rogelia Jerilynn RAMAN, MD  albuterol  (VENTOLIN  HFA) 108 [phone] Base) MCG/ACT inhaler Inhale 2 puffs into the lungs every 6 (six) hours as needed for wheezing or shortness of breath. 09/19/22   Hunsucker, Donnice JONELLE, MD  aluminum hydroxide-magnesium carbonate (ACID GONE) 95-358 MG/15ML SUSP Take 30 mLs by mouth 4 (four) times daily as needed for heartburn.    [provider]   amLODipine  (NORVASC ) 5 MG tablet Take 1 tablet (5 mg total) by mouth daily. 04/17/23 05/18/24  Pokhrel, Vernal, MD  aspirin  EC 81 MG EC tablet Take 1 tablet (81 mg total) by mouth daily. 03/16/16   Patel, Sona, MD  bethanechol (URECHOLINE) 10 MG tablet Take 10 mg by mouth 3 (three) times daily. 11/12/23   [provider]  capsaicin (ZOSTRIX) 0.025 % cream Apply 1 Application topically 2 (two) times daily as needed (foot pain).    [provider]  carboxymethylcellulose (REFRESH PLUS) 0.5 % SOLN Place 1-2 drops into both eyes as needed (irritation).    [provider]  cefUROXime (CEFTIN) 500 MG tablet Take 1 tablet (500 mg total) by mouth 2 (two) times daily with a meal for 7 days. 05/26/24 06/02/24  Glendia Rocky SAILOR, PA-C  cholecalciferol  (VITAMIN D3) 25 MCG (1000 UNIT) tablet Take 1,000 Units by mouth daily.    [provider]  diclofenac  Sodium (VOLTAREN ) 1 % GEL Apply 1 Application topically 2 (two) times daily as needed (back pain).    [provider]  DULoxetine  (CYMBALTA ) 60 MG capsule Take 60 mg by mouth daily.    [provider]  esomeprazole (NEXIUM) 40 MG capsule Take 40 mg by mouth 2 (two) times daily before a meal.    [provider]  eszopiclone  (LUNESTA ) 1 MG TABS tablet Take 1 tablet (1 mg total) by mouth at bedtime as needed for sleep. Take immediately before bedtime 05/18/24   Hope Almarie ORN, NP  famotidine (PEPCID) 40 MG tablet Take 40 mg by mouth at bedtime.  [provider]  ferrous sulfate  325 (65 FE) MG tablet Take 1 tablet (325 mg total) by mouth 3 (three) times a week. Take on Monday, Wednesday, and friday 04/18/23   Pokhrel, Laxman, MD  finasteride  (PROSCAR ) 5 MG tablet Take 1 tablet (5 mg total) by mouth daily. 04/17/23   Pokhrel, Laxman, MD  Fluticasone -Umeclidin-Vilant (TRELEGY ELLIPTA ) 100-62.5-25 MCG/ACT AEPB Inhale 1 puff into the lungs in the morning. 09/17/23   Hunsucker, Donnice SAUNDERS, MD  gabapentin   (NEURONTIN ) 400 MG capsule Take 2 capsules (800 mg total) by mouth 2 (two) times daily. Patient taking differently: Take 400-800 mg by mouth See admin instructions. Take 400 mg twice daily and 800 mg at bedtime 04/17/23   Pokhrel, Laxman, MD  Glucagon, rDNA, (GLUCAGON EMERGENCY) 1 MG KIT Inject 1 mg into the vein as needed (low blood sugar).    [provider]  glucose 4 GM chewable tablet Chew 2 tablets by mouth as needed for low blood sugar.    [provider]  HYDROcodone -acetaminophen  (NORCO/VICODIN) 5-325 MG tablet Take 1-2 tablets by mouth every 6 (six) hours as needed for moderate pain (pain score 4-6). 08/25/23 08/24/24  Carlie Clark, MD  Insulin  Aspart FlexPen (NOVOLOG ) 100 UNIT/ML Inject 35-50 Units into the skin in the morning, at noon, and at bedtime.    [provider]  insulin  glargine-yfgn (SEMGLEE) 100 UNIT/ML injection Inject 40 Units into the skin 2 (two) times daily.    [provider]  lidocaine  (LIDODERM ) 5 % Place 1 patch onto the skin daily as needed (pain). Apply to back    [provider]  Melatonin 3 MG CAPS Take 6 mg by mouth at bedtime.    [provider]  metoCLOPramide  (REGLAN ) 10 MG tablet Take 5 mg by mouth in the morning and at bedtime.    [provider]  metoprolol  succinate (TOPROL -XL) 50 MG 24 hr tablet Take 0.5 tablets (25 mg total) by mouth daily. 04/17/23   Pokhrel, Vernal, MD  morphine  (MSIR) 15 MG tablet Take 0.5 tablets (7.5 mg total) by mouth every 4 (four) hours as needed for severe pain (pain score 7-10). 12/09/23   Emil Share, DO  nitroGLYCERIN  (NITROSTAT ) 0.4 MG SL tablet Place 0.4 mg under the tongue every 5 (five) minutes as needed for chest pain.    [provider]  rosuvastatin  (CRESTOR ) 40 MG tablet Take 20 mg by mouth daily.    [provider]  senna-docusate (SENOKOT-S) 8.6-50 MG tablet Take 2 tablets by mouth at bedtime.    [provider]  sitaGLIPtin (JANUVIA)  50 MG tablet Take 50 mg by mouth daily.    [provider]  sucralfate (CARAFATE) 1 GM/10ML suspension Take 1 g by mouth 4 (four) times daily as needed (acid reflux).    [provider]  tamsulosin  (FLOMAX ) 0.4 MG CAPS capsule Take 2 capsules (0.8 mg total) by mouth at bedtime. 04/17/23   Pokhrel, Laxman, MD  vitamin B-12 (CYANOCOBALAMIN ) 500 MCG tablet Take 1,000 mcg by mouth daily.    [provider]     Allergies: Patient has no known allergies.   Review of Systems   ROS as per HPI  Physical Exam Updated Vital Signs BP 139/77   Pulse 83   Temp 98.2 F (36.8 C) (Oral)   Resp 15   Ht 6' 2 (1.88 m)   Wt 96.2 kg   SpO2 97%   BMI 27.22 kg/m  Physical Exam Vitals and nursing note reviewed.  Constitutional:      General: He is not in acute distress.    Appearance: He is well-developed.  HENT:     Head: Normocephalic and atraumatic.  Eyes:     Conjunctiva/sclera: Conjunctivae normal.  Cardiovascular:     Rate and Rhythm: Normal rate and regular rhythm.     Heart sounds: No murmur heard. Pulmonary:     Effort: Pulmonary effort is normal. No respiratory distress.     Breath sounds: Normal breath sounds.  Abdominal:     Palpations: Abdomen is soft.     Tenderness: There is no abdominal tenderness. There is no right CVA tenderness or left CVA tenderness.  Musculoskeletal:        General: No swelling.     Cervical back: Neck supple.  Skin:    General: Skin is warm and dry.     Capillary Refill: Capillary refill takes less than 2 seconds.  Neurological:     Mental Status: He is alert.  Psychiatric:        Mood and Affect: Mood normal.     ED Course/ Medical Decision Making/ A&P    Procedures Procedures   Medications Ordered in ED Medications  sulfamethoxazole-trimethoprim (BACTRIM DS) 800-160 MG per tablet 1 tablet (has no administration in time range)  ondansetron  (ZOFRAN -ODT) disintegrating tablet 4 mg (4 mg Oral Given 05/29/24 1006)   lactated ringers  bolus 1,000 mL (1,000 mLs Intravenous New Bag/Given 05/29/24 1318)  ondansetron  (ZOFRAN ) injection 4 mg (4 mg Intravenous Given 05/29/24 1317)    Medical Decision Making:   BRENON ANTOSH is a 63 y.o. male who presents for nausea, vomiting, urinary frequency as per above.  Physical exam is pertinent for no focal abnormalities, notably no abdominal tenderness, no CVA tenderness.   The differential includes but is not limited to persistent UTI, sepsis, pyelonephritis, nephrolithiasis, dehydration, AKI, metabolic derangement.  Independent historian: None  External data reviewed: Labs and Notes: Reviewed patient's recent ED notes, patient was seen in the ED on 10/15 and was diagnosed with UTI, started on cephalosporin, micro data from urine culture taken at that time shows penicillin resistant staph.  Labs: Ordered, Independent interpretation, and Details: CBC with mild leukocytosis to 11.3, worsened on comparison to prior.  No anemia or thrombocytopenia. Lipase WNL.  Creatinine at baseline on comparison to prior, hyperglycemia similar to prior without anion gap elevation.  No emergent electrolyte derangement or emergent LFT abnormality.  Radiology: Not indicated No results found.  EKG/Medicine tests: Not indicated EKG Interpretation:                  Interventions: LR, Zofran , Bactrim  See the EMR for full details regarding lab and imaging results.  On exam, patient is vitally stable, well-appearing, afebrile, therefore doubt sepsis.  Abdomen soft, nontender, no CVA tenderness, doubt pyelonephritis.  Patient does have ongoing nausea, is belching on exam, therefore we will treat with IV Zofran , LR bolus.  Labs obtained and do not demonstrate emergent electrolyte derangement, significant dehydration.  Lipase WNL, doubt pancreatitis.  Given patient's symptoms of nausea and vomiting today temporally correlated to taking his antibiotic, feel that symptoms are most likely due to  side effect of antibiotic, given exam and labs otherwise reassuring, feel that CT would overall be of low yield at this time.  On reevaluation after fluids and Zofran , patient feels that he is back to baseline.  Will p.o. challenge patient and give him first dose of Bactrim in the ED, and discharged with course  of Bactrim.  Presentation is most consistent with acute complicated illness and I did consider and rule out acute life/limb-threatening illness  Discussion of management or test interpretations with external provider(s): Not indicated  Risk Drugs:Prescription drug management  Disposition: DISCHARGE: I believe that the patient is safe for discharge home with outpatient follow-up. Patient was informed of all pertinent physical exam, laboratory, and imaging findings. Patient's suspected etiology of their symptom presentation was discussed with the patient and all questions were answered. We discussed following up with PCP. I provided thorough ED return precautions. The patient feels safe and comfortable with this plan.  MDM generated using voice dictation software and may contain dictation errors.  Please contact me for any clarification or with any questions.  Clinical Impression:  1. Cystitis   2. Hyperglycemia      Discharge   Final Clinical Impression(s) / ED Diagnoses Final diagnoses:  Cystitis  Hyperglycemia    Rx / DC Orders ED Discharge Orders          Ordered    sulfamethoxazole-trimethoprim (BACTRIM DS) 800-160 MG tablet  2 times daily        05/29/24 1603             Rogelia Jerilynn RAMAN, MD 05/29/24 1606

## 2024-06-25 ENCOUNTER — Encounter: Payer: Self-pay | Admitting: Primary Care

## 2024-06-25 ENCOUNTER — Ambulatory Visit: Admitting: Primary Care

## 2024-06-25 VITALS — BP 138/82 | HR 93 | Temp 97.6°F | Ht 74.0 in | Wt 227.0 lb

## 2024-06-25 DIAGNOSIS — G4700 Insomnia, unspecified: Secondary | ICD-10-CM

## 2024-06-25 DIAGNOSIS — G4733 Obstructive sleep apnea (adult) (pediatric): Secondary | ICD-10-CM

## 2024-06-25 MED ORDER — ESZOPICLONE 2 MG PO TABS: 2.0000 mg | ORAL_TABLET | Freq: Every evening | ORAL | 2 refills | Status: DC | PRN

## 2024-06-25 NOTE — Progress Notes (Signed)
 @Patient  ID: Nathaniel Colon, male    DOB: 07/27/1961, 63 y.o.   MRN: [id number]  Chief Complaint  Patient presents with   Obstructive Sleep Apnea    Inspire    Referring provider: [facility], [facility]  HPI: 63 year old male, former smoker. PMH significant for CHF, chronic respiratory failure, GERD, type 2 diabetes, sleep apnea.    Previous [facility] pulmonary encounter:  10/06/2023 Patient presents today for Inspire activation. He was last seen by our office in May by Dr. Annella for dyspnea symptoms. Hx sleep apnea, intolerant to CPAP. Uses continuous O2 2L/min 24 hours.    > Sleep study done through the [facility], AHI 71.5  > Intolerant to CPAP  > Hypoglossal nerve stimulator (Inspire) implant date 08/25/23 with Dr. Carlie      Discussed the use of AI scribe software for clinical note transcription with the patient, who gave verbal consent to proceed.   History of Present Illness   Nathaniel Colon is a 63 year old male who presents for activation of the Inspire device following implantation for sleep apnea.   The Inspire device was implanted in January after a sleep study conducted several months prior through the [facility]. He had previously attempted CPAP therapy but was unable to tolerate it. Since the implantation, he has not experienced any issues, including trouble swallowing or pain at the site.   He uses supplemental oxygen at a rate of two liters per minute while at rest. His current oxygen tank lasts about an hour.   Regarding his sleep, he typically goes to bed between 10 and 11 PM, taking melatonin around 8 to 9 PM to aid in falling asleep, which usually takes about an hour. He wakes up multiple times during the night to urinate, sometimes as frequently as twenty times, affecting his ability to return to sleep quickly.       Sensation level - 0.9 Functional level- 0.7 V (lower limit 0.7 - upper limit 1.7) Start delay- 60 mins Pause delay - 20 mins Sleep duration-  9 hours      01/07/2024 Discussed the use of AI scribe software for clinical note transcription with the patient, who gave verbal consent to proceed.   History of Present Illness   Nathaniel Colon is a 63 year old male with sleep apnea who presents for 4-6 week follow-up following inspire activation on 10/06/23.    He experiences ongoing difficulties with sleep primarily due to his sleep apnea. Inspire use is inconsistent. On nights he uses the device, he does not wake himself up snoring, which was a previous issue. However, he struggles to fall asleep within the hour the device is active and often has to pause or restart it. He also experiences frequent awakenings at night to urinate, which disrupts his sleep further.   He is currently using a lidocaine  patch for pain management, applying it as needed. He has not been prescribed any other pain medications and is awaiting an appointment with a pain specialist. For sleep, he uses melatonin.   He has not increased the level of his Inspire device recently. He reports feeling the device's stimulation when awake, which can be uncomfortable. A previous sleep study recorded seventy-one apneic events per hour. No waking up snoring on nights he uses the [facility] device.   He has difficulty falling asleep and frequent awakenings at night.      02/09/2024 Discussed the use of AI scribe software for clinical note transcription with the  patient, who gave verbal consent to proceed.   History of Present Illness   Nathaniel Colon is a 64 year old male with severe sleep apnea who presents for follow-up regarding his hypoglossal nerve stimulator.   He has severe sleep apnea, initially diagnosed through sleep studies conducted by the [facility], which showed intolerance to CPAP therapy. He underwent implantation of a hypoglossal nerve stimulator on January 13th, and the device was activated in February. He has difficulty remembering to increase the device's settings weekly, having only  done so once or twice this month. He uses the device nightly, with usage averaging about 40 hours per week, equating to approximately five hours per night. He is currently on level four of the device, with an amplitude of 1.0, and has not experienced any discomfort or pain from its use. He also uses supplemental oxygen at night, at a rate of two liters.   He continues to experience difficulty falling and staying asleep, despite the use of Lunesta , which he takes at a dose of 1 mg. He takes the medication between 8 to 10 PM, but still struggles with sleep onset, sometimes not falling asleep until midnight or later. He notes that when he does fall asleep, his sleep quality improves significantly when using Inspire, as he is not snoring or waking himself up. He acknowledges a subjective benefit from the device, stating 'when I am sleeping, I sleep a whole lot better.'   No trouble swallowing.    Sleep Sync compliance report 12/21/2023 - 01/19/2024 Total nights used 7/30 days (23%) greater than 4 hours Average hours per night used 8 hours 21 minutes Average pauses per night 1.0 Current amplitude 0.9 voltage (upper limit 1.7V/lower limit 0.7V)   Severe sleep apnea Severe sleep apnea managed with hypoglossal nerve stimulator (Inspire) implanted in January. Device activated in February. Current usage is about 40 hours a week, approximately 5 hours per night. Subjective improvement with better sleep quality and reduced snoring. Currently on level four (1.0V) with plans to increase to level five. No discomfort with current settings. - Increase Inspire device to level five (1.1V) today. - Instruct to bring remote to next visit for data upload. - Use Inspire device every night for a minimum of four hours. - Increase Inspire level weekly as tolerated, with assistance from nurse every Tuesday. - FU 4-6 weeks for readiness check  - Schedule Inspire titration study after reaching higher levels.    Insomnia Persistent difficulty falling asleep despite use of Lunesta  1 mg. Reports variable sleep onset times ranging from 8 PM to 1 AM. Current dose may be insufficient. - Increase Lunesta  to 2 mg nightly. - Instruct to activate Inspire device before taking Lunesta  to ensure device is on before falling asleep.   Oxygen therapy Oxygen therapy at 2 liters per minute used at night. No issues reported with current oxygen use. - Continue oxygen therapy at 2 L/min at night.    03/10/2024 Discussed the use of AI scribe software for clinical note transcription with the patient, who gave verbal consent to proceed.  History of Present Illness   Nathaniel Colon is a 63 year old male with sleep apnea who presents for a readiness check for Inspire titration.  He has been using the Inspire device and recently increased the level to seven. He manages the settings independently and notes a benefit from the device, although he experiences some discomfort for a couple of days after increasing the level. Some nights, frequent bathroom trips  prevent adequate sleep.  He uses oxygen at night at a rate of two liters. Previously, he took Lunesta  at a dose of two mg but found it too sedating. He has since adjusted his regimen, currently taking Lunesta  without melatonin, and reports improved sleep without grogginess, waking up around 6:30 to 7:00 AM and staying awake throughout the day.  He continues to use Trelegy once a day for his breathing and occasionally uses a rescue inhaler, approximately once every other week, when experiencing difficulty breathing. No recent respiratory infections or colored mucus.  He experiences nocturia, waking up at night to use the restroom frequently, which affects his sleep. He is taking tamsulosin  (Flomax ) as prescribed. He also experiences pain at times, which can wake him up at night.      Sleep Sync compliance report Total nights used /30 days () greater than 4 hours Average  hours per night used  Average pauses per night  Current amplitude 1.3 voltage (upper limit 1.7V/lower limit 0.7V)   05/14/2024- Interim hx  Discussed the use of AI scribe software for clinical note transcription with the patient, who gave verbal consent to proceed.  History of Present Illness Nathaniel Colon is a 63 year old male with sleep apnea who presents with difficulty sleeping and issues with his Inspire device settings. He had a sleep study done through the [facility] which showed severe OSA with AHI 71.5/hr.  He underwent Inspire titration study on 04/22/24, inspire titration to 1.6V with residual AHI (4%) 14.6/hour with oxygen saturation nadir 86%. Inspire settings were adjusted during his titration study with range 1.3-1.6. Current level 2 (1.4V).   He has been experiencing difficulty sleeping since his last in-lab sleep study, where he initially slept well. Despite sleeping well during the titration study, he has not slept much at all since then. His usage of the Inspire device is only five hours per week, and he has been unable to sleep effectively. The Inspire device, when set at a higher level, causes discomfort, particularly in his tongue, which wakes him up.   He has not been taking his prescribed sleep medications, including Lunesta , due to side effects such as grogginess and brain fog. He has also stopped taking gabapentin , which was prescribed for neuropathy in his feet, as it did not provide relief and contributed to his cognitive side effects.  He has been trying to manage his sleep by taking naps during the day when possible. He frequently resets the device, with a start delay of an hour and fifteen minutes and a pause time of twenty-five minutes, in an attempt to fall asleep. He prefers not to sleep than to experience the side effects of the medications he was previously taking.   06/25/2024- Interim hx  Discussed the use of AI scribe software for clinical note transcription with  the patient, who gave verbal consent to proceed.  History of Present Illness Nathaniel Colon is a 63 year old male with severe sleep apnea who presents with insomnia and difficulty using the [facility] device.  He has a history of severe sleep apnea. Sleep study done through the [facility], AHI 71.5. Inspire device placed in January 2024 after being intolerant to CPAP therapy. He underwent a titration study in September 2024, but has since struggled with sleep.  He experiences significant insomnia, stating he has not slept well since the titration study. He attributes his sleep difficulties to stress related to unemployment and financial issues, having lost his job in December 2023. He  has gained approximately 15-20 pounds in the past month due to these stressors. He is currently taking Lunesta  at 1 mg nightly around 8 to 9 PM, but it does not help him sleep, as he only manages to take short naps during the day. He experiences frequent nocturia, urinating up to 20 times a night, which disrupts his sleep further.  He describes difficulty using the [facility] device, as it causes discomfort when it activates while he is awake. When he is asleep, the device does not bother him. He has not been able to increase the device's settings due to being awake when it activates.  He reports gastrointestinal issues, including vomiting at night due to food not passing through his stomach. He mentions his blood sugar levels have been fluctuating significantly, ranging from over 500 to the 50s, which adds to his stress and impacts his overall health. He is seeing a digestive specialist for further evaluation.  Sleep sync 03/11/24-06/08/24 Usage 52/90 days (58%); 35/90 days (39%) >4 hours Usage per night 5 hours 36 min Amplitude 1.4 (range 1.3-1.7V) Average pause 2.7   No Known Allergies  Immunization History  Administered Date(s) Administered   Influenza, Seasonal, Injecte, Preservative Fre 05/18/2024   Influenza,inj,Quad  PF,6+ Mos 06/21/2021, 07/15/2022   Moderna Sars-Covid-2 Vaccination 11/18/2019, 12/16/2019   PNEUMOCOCCAL CONJUGATE-20 06/21/2021   Tdap 08/13/2015   Zoster Recombinant(Shingrix) 04/28/2020, 10/25/2020    Past Medical History:  Diagnosis Date   Anginal pain    Arthritis    Chronic kidney disease    Complication of anesthesia    patient reports hard to wake up after having EGD   COPD (chronic obstructive pulmonary disease) (HCC)    Coronary artery disease    Depression    Diabetes mellitus    Diabetic gastroparesis (HCC)    Dyspnea    GERD (gastroesophageal reflux disease)    H/O hiatal hernia    Hypertension    Neuromuscular disorder (HCC)    MUSCLE SPASMS   Peripheral vascular disease    PERIPHERAL NEUROPATHY    Sleep apnea     Tobacco History: Social History   Tobacco Use  Smoking Status Former   Current packs/day: 0.00   Average packs/day: 0.5 packs/day for 30.0 years (15.0 ttl pk-yrs)   Types: Cigarettes   Start date: 72   Quit date: 2018   Years since quitting: 7.8  Smokeless Tobacco Never   Counseling given: Not Answered   Outpatient Medications Prior to Visit  Medication Sig Dispense Refill   albuterol  (VENTOLIN  HFA) 108 (90 Base) MCG/ACT inhaler Inhale 2 puffs into the lungs every 6 (six) hours as needed for wheezing or shortness of breath. 8 g 3   aluminum hydroxide-magnesium carbonate (ACID GONE) 95-358 MG/15ML SUSP Take 30 mLs by mouth 4 (four) times daily as needed for heartburn.     amLODipine  (NORVASC ) 5 MG tablet Take 1 tablet (5 mg total) by mouth daily. 30 tablet 2   aspirin  EC 81 MG EC tablet Take 1 tablet (81 mg total) by mouth daily. 30 tablet 0   bethanechol (URECHOLINE) 10 MG tablet Take 10 mg by mouth 3 (three) times daily.     capsaicin (ZOSTRIX) 0.025 % cream Apply 1 Application topically 2 (two) times daily as needed (foot pain).     carboxymethylcellulose (REFRESH PLUS) 0.5 % SOLN Place 1-2 drops into both eyes as needed  (irritation).     cholecalciferol  (VITAMIN D3) 25 MCG (1000 UNIT) tablet Take 1,000 Units by mouth daily.  diclofenac  Sodium (VOLTAREN ) 1 % GEL Apply 1 Application topically 2 (two) times daily as needed (back pain).     DULoxetine  (CYMBALTA ) 60 MG capsule Take 60 mg by mouth daily.     esomeprazole (NEXIUM) 40 MG capsule Take 40 mg by mouth 2 (two) times daily before a meal.     eszopiclone  (LUNESTA ) 1 MG TABS tablet Take 1 tablet (1 mg total) by mouth at bedtime as needed for sleep. Take immediately before bedtime 30 tablet 2   famotidine (PEPCID) 40 MG tablet Take 40 mg by mouth at bedtime.     ferrous sulfate  325 (65 FE) MG tablet Take 1 tablet (325 mg total) by mouth 3 (three) times a week. Take on Monday, Wednesday, and friday 100 tablet 3   finasteride  (PROSCAR ) 5 MG tablet Take 1 tablet (5 mg total) by mouth daily. 30 tablet 2   Fluticasone -Umeclidin-Vilant (TRELEGY ELLIPTA ) 100-62.5-25 MCG/ACT AEPB Inhale 1 puff into the lungs in the morning. 60 each 11   gabapentin  (NEURONTIN ) 400 MG capsule Take 2 capsules (800 mg total) by mouth 2 (two) times daily. (Patient taking differently: Take 400-800 mg by mouth See admin instructions. Take 400 mg twice daily and 800 mg at bedtime) 60 capsule 2   Glucagon, rDNA, (GLUCAGON EMERGENCY) 1 MG KIT Inject 1 mg into the vein as needed (low blood sugar).     glucose 4 GM chewable tablet Chew 2 tablets by mouth as needed for low blood sugar.     HYDROcodone -acetaminophen  (NORCO/VICODIN) 5-325 MG tablet Take 1-2 tablets by mouth every 6 (six) hours as needed for moderate pain (pain score 4-6). 12 tablet 0   Insulin  Aspart FlexPen (NOVOLOG ) 100 UNIT/ML Inject 35-50 Units into the skin in the morning, at noon, and at bedtime.     insulin  glargine-yfgn (SEMGLEE) 100 UNIT/ML injection Inject 40 Units into the skin 2 (two) times daily.     lidocaine  (LIDODERM ) 5 % Place 1 patch onto the skin daily as needed (pain). Apply to back     Melatonin 3 MG CAPS Take 6  mg by mouth at bedtime.     metoCLOPramide  (REGLAN ) 10 MG tablet Take 5 mg by mouth in the morning and at bedtime.     metoprolol  succinate (TOPROL -XL) 50 MG 24 hr tablet Take 0.5 tablets (25 mg total) by mouth daily. 30 tablet 2   morphine  (MSIR) 15 MG tablet Take 0.5 tablets (7.5 mg total) by mouth every 4 (four) hours as needed for severe pain (pain score 7-10). 5 tablet 0   nitroGLYCERIN  (NITROSTAT ) 0.4 MG SL tablet Place 0.4 mg under the tongue every 5 (five) minutes as needed for chest pain.     rosuvastatin  (CRESTOR ) 40 MG tablet Take 20 mg by mouth daily.     senna-docusate (SENOKOT-S) 8.6-50 MG tablet Take 2 tablets by mouth at bedtime.     sitaGLIPtin (JANUVIA) 50 MG tablet Take 50 mg by mouth daily.     sucralfate (CARAFATE) 1 GM/10ML suspension Take 1 g by mouth 4 (four) times daily as needed (acid reflux).     tamsulosin  (FLOMAX ) 0.4 MG CAPS capsule Take 2 capsules (0.8 mg total) by mouth at bedtime. 60 capsule 2   vitamin B-12 (CYANOCOBALAMIN ) 500 MCG tablet Take 1,000 mcg by mouth daily.     No facility-administered medications prior to visit.   Review of Systems  Review of Systems  Psychiatric/Behavioral:  Positive for sleep disturbance.    Physical Exam  BP 138/82  Pulse 93   Temp 97.6 F (36.4 C)   Ht 6' 2 (1.88 m)   Wt 227 lb (103 kg)   SpO2 96% Comment: ra  BMI 29.15 kg/m  Physical Exam Constitutional:      Appearance: Normal appearance. He is well-developed.  HENT:     Head: Normocephalic and atraumatic.     Mouth/Throat:     Mouth: Mucous membranes are moist.     Pharynx: Oropharynx is clear.  Cardiovascular:     Rate and Rhythm: Normal rate and regular rhythm.     Heart sounds: Normal heart sounds.  Pulmonary:     Effort: Pulmonary effort is normal. No respiratory distress.     Breath sounds: Normal breath sounds. No wheezing or rhonchi.  Musculoskeletal:        General: Normal range of motion.     Cervical back: Normal range of motion and neck  supple.  Skin:    General: Skin is warm and dry.     Findings: No erythema or rash.  Neurological:     General: No focal deficit present.     Mental Status: He is alert and oriented to person, place, and time. Mental status is at baseline.  Psychiatric:        Mood and Affect: Mood normal.        Behavior: Behavior normal.        Thought Content: Thought content normal.        Judgment: Judgment normal.      Lab Results:  CBC    Component Value Date/Time   WBC 11.3 (H) 05/29/2024 1015   RBC 5.20 05/29/2024 1015   HGB 15.9 05/29/2024 1015   HCT 49.5 05/29/2024 1015   PLT 258 05/29/2024 1015   MCV 95.2 05/29/2024 1015   MCH 30.6 05/29/2024 1015   MCHC 32.1 05/29/2024 1015   RDW 14.4 05/29/2024 1015   LYMPHSABS 2.2 05/26/2024 1542   MONOABS 0.7 05/26/2024 1542   EOSABS 0.2 05/26/2024 1542   BASOSABS 0.1 05/26/2024 1542    BMET    Component Value Date/Time   NA 134 (L) 05/29/2024 1015   K 4.1 05/29/2024 1015   CL 95 (L) 05/29/2024 1015   CO2 25 05/29/2024 1015   GLUCOSE 408 (H) 05/29/2024 1015   BUN 22 05/29/2024 1015   CREATININE 1.69 (H) 05/29/2024 1015   CALCIUM  8.7 (L) 05/29/2024 1015   GFRNONAA 46 (L) 05/29/2024 1015   GFRAA >60 03/15/2016 1253    BNP    Component Value Date/Time   BNP 19.0 06/18/2022 1203    ProBNP No results found for: PROBNP  Imaging: No results found.   Assessment & Plan:    1. OSA (obstructive sleep apnea) (Primary)  2. Insomnia, unspecified type   Assessment and Plan Assessment & Plan Insomnia in the setting of severe obstructive sleep apnea status post Inspire device Severe obstructive sleep apnea with intolerance to CPAP. Inspire device placed in January 2024 with titration study in September 2024. Reports poor sleep quality, frequent daytime naps, and stress-related insomnia. Currently on Lunesta  1 mg at night, but experiencing difficulty falling asleep. Appears he slept better while taking a combination for  Lunesta  and melatonin back in August prior to titration study. Medication was decreased due to reported daytime grogginess. Device settings may be contributing to discomfort when awake. Current device level is 1.5 (level 3), with average usage of 5 hours 36 minutes per night. Primary issue identified as insomnia, with device settings  adjusted to improve comfort and facilitate better sleep onset. - Increased Lunesta  dose to 2 mg at night. - Instructed to turn on Inspire device within 30 minutes of taking Lunesta . - Adjusted Inspire device settings and lower range to level 2 as the starting point, allowing for gradual increase as sleep improves. - Instructed to call in a week or two to report on the effectiveness of the increased Lunesta  dose. - Will consider adding melatonin if insomnia persists after increasing Lunesta  dose.  Gastroesophageal reflux symptoms Reports frequent vomiting at night when lying down, with food not passing through the stomach. Symptoms suggestive of gastroesophageal reflux, potentially contributing to insomnia. Scheduled for further evaluation by a GI specialist. - Recommended trying Gourmet RX, a liquid seaweed supplement, to coat and protect the esophageal lining and help with reflux. - Continue follow-up with GI specialist for further evaluation of stomach emptying issues.  Recording duration: 21 minutes  Almarie LELON Ferrari, NP 06/25/2024

## 2024-06-25 NOTE — Patient Instructions (Addendum)
    VISIT SUMMARY: During your visit, we discussed your ongoing issues with insomnia and difficulty using the Inspire device for your severe sleep apnea. We also addressed your gastrointestinal symptoms and their potential impact on your sleep quality.  YOUR PLAN: -INSOMNIA IN THE SETTING OF SEVERE OBSTRUCTIVE SLEEP APNEA STATUS POST INSPIRE DEVICE: Insomnia is difficulty falling or staying asleep. Your severe sleep apnea, treated with the Inspire device, and stress are contributing to your insomnia. We have increased your Lunesta  dose to 2 mg at night and adjusted your Inspire device settings to improve comfort. Please turn on the Inspire device within 30 minutes of taking Lunesta . Call us  in a week or two to report on the effectiveness of the increased dose. If insomnia persists, we may consider adding melatonin.  -GASTROESOPHAGEAL REFLUX SYMPTOMS: Gastroesophageal reflux is when stomach acid flows back into the tube connecting your mouth and stomach, causing discomfort and vomiting. This may be contributing to your insomnia. We recommend trying G[employer]met RX, a liquid seaweed supplement, to help with reflux. Continue your follow-up with the GI specialist for further evaluation.  INSTRUCTIONS: Please call us  in a week or two to report on the effectiveness of the increased Lunesta  dose. Continue your follow-up with the GI specialist for further evaluation of your stomach emptying issues.  Follow-up 6-8 weeks with Landry NP - Carren will call you from our office

## 2024-06-29 ENCOUNTER — Other Ambulatory Visit (HOSPITAL_COMMUNITY): Payer: Self-pay | Admitting: Internal Medicine

## 2024-06-29 DIAGNOSIS — Z87891 Personal history of nicotine dependence: Secondary | ICD-10-CM

## 2024-06-30 ENCOUNTER — Encounter: Payer: Self-pay | Admitting: Pulmonary Disease

## 2024-06-30 ENCOUNTER — Ambulatory Visit: Admitting: Pulmonary Disease

## 2024-06-30 VITALS — BP 158/93 | HR 90 | Temp 98.0°F | Ht 74.0 in | Wt 230.0 lb

## 2024-06-30 DIAGNOSIS — J42 Unspecified chronic bronchitis: Secondary | ICD-10-CM | POA: Diagnosis not present

## 2024-06-30 DIAGNOSIS — R0609 Other forms of dyspnea: Secondary | ICD-10-CM

## 2024-06-30 MED ORDER — PREDNISONE 20 MG PO TABS: ORAL_TABLET | ORAL | 0 refills | Status: AC

## 2024-06-30 MED ORDER — TRELEGY ELLIPTA 100-62.5-25 MCG/ACT IN AEPB: 1.0000 | INHALATION_SPRAY | Freq: Every morning | RESPIRATORY_TRACT | 12 refills | Status: AC

## 2024-06-30 MED ORDER — ALBUTEROL SULFATE HFA 108 (90 BASE) MCG/ACT IN AERS: 2.0000 | INHALATION_SPRAY | Freq: Four times a day (QID) | RESPIRATORY_TRACT | 6 refills | Status: AC | PRN

## 2024-06-30 NOTE — Patient Instructions (Signed)
 Nice to see you again  I refilled your Trelegy and albuterol , I am glad it is working well  Take prednisone  as prescribed to help with the back pain  Return to clinic in 1 year or sooner as needed with Dr. Annella

## 2024-06-30 NOTE — Progress Notes (Signed)
 @Patient  ID: Nathaniel Colon, male    DOB: 21-Sep-1960, 63 y.o.   MRN: [id number]  Chief Complaint  Patient presents with   Shortness of Breath    Patient his breathing has improved a little.    Referring provider: [facility], [facility]  HPI:   63 y.o. man whom we are seeing for evaluation of dyspnea on exertion and hospital follow-up for pneumonia and chronic hypoxemic respiratory failure here.  Most recent ENT note reviewed.   Doing fine.  Stable hypoxemia 2 L.  Symptoms of dyspnea pretty severe but unchanged, stable.  Feels like Trelegy is helpful.  Good adherence to this.  Using albuterol  couple times a week.  Does find it helpful.   HPI at initial visit: Patient reports shortness of breath on the last 6 to months.  Not much issue before that.  Worse on stairs or when hurrying.  No times a day with things are better or worse.  No position makes his better or worse.  No seasonal or environmental factors he can identify that make things better or worse.  No other alleviating or exacerbating factors.  Has not used inhalers.  He endorses a lot of issues with cough, gagging in the night.  Regurgitating, heartburn symptoms very bad.  Following with GI.  Recent endoscopy.  He has a hiatal hernia.  The symptoms seem to be improving with treatment.  He says when he vomits and used to vomit more, it looked like fecal matter.  His chest x-ray 06/2022 personally reviewed interpret as clear lungs bilaterally.  CTA PE protocol 06/2022 personally reviewed and interpreted as no PE, bronchial thickening that is diffuse, otherwise clear lungs.  PMH: Hypertension, CHF, diabetes, CKD, tobacco abuse in remission Surgical history: Neck surgery, PCI times 10/02/2015 Family history: Reviewed, no significant respiratory illness in first-degree relatives Social history: Former smoker, 1 pack a day for 30 years, quit a few years ago, lives in [place] / Pulmonary Flowsheets:   ACT:       No data to display          MMRC:     No data to display          Epworth:      No data to display          Tests:   FENO:  No results found for: NITRICOXIDE  PFT:     No data to display          WALK:      No data to display          Imaging: Personally reviewed and as per EMR discussion this note No results found.   Lab Results: Personally reviewed CBC    Component Value Date/Time   WBC 11.3 (H) 05/29/2024 1015   RBC 5.20 05/29/2024 1015   HGB 15.9 05/29/2024 1015   HCT 49.5 05/29/2024 1015   PLT 258 05/29/2024 1015   MCV 95.2 05/29/2024 1015   MCH 30.6 05/29/2024 1015   MCHC 32.1 05/29/2024 1015   RDW 14.4 05/29/2024 1015   LYMPHSABS 2.2 05/26/2024 1542   MONOABS 0.7 05/26/2024 1542   EOSABS 0.2 05/26/2024 1542   BASOSABS 0.1 05/26/2024 1542    BMET    Component Value Date/Time   NA 134 (L) 05/29/2024 1015   K 4.1 05/29/2024 1015   CL 95 (L) 05/29/2024 1015   CO2 25 05/29/2024 1015   GLUCOSE 408 (H) 05/29/2024 1015   BUN 22 05/29/2024  1015   CREATININE 1.69 (H) 05/29/2024 1015   CALCIUM  8.7 (L) 05/29/2024 1015   GFRNONAA 46 (L) 05/29/2024 1015   GFRAA >60 03/15/2016 1253    BNP    Component Value Date/Time   BNP 19.0 06/18/2022 1203    ProBNP No results found for: PROBNP  Specialty Problems       Pulmonary Problems   Dyspnea   Chronic hypoxic respiratory failure (HCC)    No Known Allergies  Immunization History  Administered Date(s) Administered   Influenza, Seasonal, Injecte, Preservative Fre 05/18/2024   Influenza,inj,Quad PF,6+ Mos 06/21/2021, 07/15/2022   Moderna Sars-Covid-2 Vaccination 11/18/2019, 12/16/2019   PNEUMOCOCCAL CONJUGATE-20 06/21/2021   Tdap 08/13/2015   Zoster Recombinant(Shingrix) 04/28/2020, 10/25/2020    Past Medical History:  Diagnosis Date   Anginal pain    Arthritis    Chronic kidney disease    Complication of anesthesia    patient reports hard to wake up after  having EGD   COPD (chronic obstructive pulmonary disease) (HCC)    Coronary artery disease    Depression    Diabetes mellitus    Diabetic gastroparesis (HCC)    Dyspnea    GERD (gastroesophageal reflux disease)    H/O hiatal hernia    Hypertension    Neuromuscular disorder (HCC)    MUSCLE SPASMS   Peripheral vascular disease    PERIPHERAL NEUROPATHY    Sleep apnea     Tobacco History: Social History   Tobacco Use  Smoking Status Former   Current packs/day: 0.00   Average packs/day: 0.5 packs/day for 30.0 years (15.0 ttl pk-yrs)   Types: Cigarettes   Start date: 83   Quit date: 2018   Years since quitting: 7.8  Smokeless Tobacco Never   Counseling given: Not Answered   Continue to not smoke  Outpatient Encounter Medications as of 06/30/2024  Medication Sig   aluminum hydroxide-magnesium carbonate (ACID GONE) 95-358 MG/15ML SUSP Take 30 mLs by mouth 4 (four) times daily as needed for heartburn.   aspirin  EC 81 MG EC tablet Take 1 tablet (81 mg total) by mouth daily.   bethanechol (URECHOLINE) 10 MG tablet Take 10 mg by mouth 3 (three) times daily.   capsaicin (ZOSTRIX) 0.025 % cream Apply 1 Application topically 2 (two) times daily as needed (foot pain).   carboxymethylcellulose (REFRESH PLUS) 0.5 % SOLN Place 1-2 drops into both eyes as needed (irritation).   cholecalciferol  (VITAMIN D3) 25 MCG (1000 UNIT) tablet Take 1,000 Units by mouth daily.   diclofenac  Sodium (VOLTAREN ) 1 % GEL Apply 1 Application topically 2 (two) times daily as needed (back pain).   DULoxetine  (CYMBALTA ) 60 MG capsule Take 60 mg by mouth daily.   esomeprazole (NEXIUM) 40 MG capsule Take 40 mg by mouth 2 (two) times daily before a meal.   eszopiclone  (LUNESTA ) 2 MG TABS tablet Take 1 tablet (2 mg total) by mouth at bedtime as needed for sleep. Take immediately before bedtime   famotidine (PEPCID) 40 MG tablet Take 40 mg by mouth at bedtime.   ferrous sulfate  325 (65 FE) MG tablet Take 1 tablet  (325 mg total) by mouth 3 (three) times a week. Take on Monday, Wednesday, and friday   Glucagon, rDNA, (GLUCAGON EMERGENCY) 1 MG KIT Inject 1 mg into the vein as needed (low blood sugar).   glucose 4 GM chewable tablet Chew 2 tablets by mouth as needed for low blood sugar.   Insulin  Aspart FlexPen (NOVOLOG ) 100 UNIT/ML Inject 35-50 Units into  the skin in the morning, at noon, and at bedtime.   insulin  glargine-yfgn (SEMGLEE) 100 UNIT/ML injection Inject 40 Units into the skin 2 (two) times daily.   lidocaine  (LIDODERM ) 5 % Place 1 patch onto the skin daily as needed (pain). Apply to back   metoprolol  succinate (TOPROL -XL) 50 MG 24 hr tablet Take 0.5 tablets (25 mg total) by mouth daily.   nitroGLYCERIN  (NITROSTAT ) 0.4 MG SL tablet Place 0.4 mg under the tongue every 5 (five) minutes as needed for chest pain.   predniSONE (DELTASONE) 20 MG tablet Take 2 tablets (40 mg total) by mouth daily with breakfast for 5 days, THEN 1 tablet (20 mg total) daily with breakfast for 5 days.   rosuvastatin  (CRESTOR ) 40 MG tablet Take 20 mg by mouth daily.   senna-docusate (SENOKOT-S) 8.6-50 MG tablet Take 2 tablets by mouth at bedtime.   sitaGLIPtin (JANUVIA) 50 MG tablet Take 50 mg by mouth daily.   tamsulosin  (FLOMAX ) 0.4 MG CAPS capsule Take 2 capsules (0.8 mg total) by mouth at bedtime.   vitamin B-12 (CYANOCOBALAMIN ) 500 MCG tablet Take 1,000 mcg by mouth daily.   [DISCONTINUED] albuterol  (VENTOLIN  HFA) 108 (90 Base) MCG/ACT inhaler Inhale 2 puffs into the lungs every 6 (six) hours as needed for wheezing or shortness of breath.   [DISCONTINUED] Fluticasone -Umeclidin-Vilant (TRELEGY ELLIPTA ) 100-62.5-25 MCG/ACT AEPB Inhale 1 puff into the lungs in the morning.   albuterol  (VENTOLIN  HFA) 108 (90 Base) MCG/ACT inhaler Inhale 2 puffs into the lungs every 6 (six) hours as needed for wheezing or shortness of breath.   amLODipine  (NORVASC ) 5 MG tablet Take 1 tablet (5 mg total) by mouth daily.   finasteride   (PROSCAR ) 5 MG tablet Take 1 tablet (5 mg total) by mouth daily.   Fluticasone -Umeclidin-Vilant (TRELEGY ELLIPTA ) 100-62.5-25 MCG/ACT AEPB Inhale 1 puff into the lungs in the morning.   metoCLOPramide  (REGLAN ) 10 MG tablet Take 5 mg by mouth in the morning and at bedtime.   sucralfate (CARAFATE) 1 GM/10ML suspension Take 1 g by mouth 4 (four) times daily as needed (acid reflux).   [DISCONTINUED] gabapentin  (NEURONTIN ) 400 MG capsule Take 2 capsules (800 mg total) by mouth 2 (two) times daily. (Patient not taking: Reported on 06/30/2024)   [DISCONTINUED] HYDROcodone -acetaminophen  (NORCO/VICODIN) 5-325 MG tablet Take 1-2 tablets by mouth every 6 (six) hours as needed for moderate pain (pain score 4-6). (Patient not taking: Reported on 06/30/2024)   [DISCONTINUED] Melatonin 3 MG CAPS Take 6 mg by mouth at bedtime. (Patient not taking: Reported on 06/30/2024)   [DISCONTINUED] morphine  (MSIR) 15 MG tablet Take 0.5 tablets (7.5 mg total) by mouth every 4 (four) hours as needed for severe pain (pain score 7-10). (Patient not taking: Reported on 06/30/2024)   No facility-administered encounter medications on file as of 06/30/2024.     Review of Systems  Review of Systems  N/a Physical Exam  BP (!) 158/93   Pulse 90   Temp 98 F (36.7 C) (Oral)   Ht 6' 2 (1.88 m)   Wt 230 lb (104.3 kg)   SpO2 94%   BMI 29.53 kg/m   Wt Readings from Last 5 Encounters:  06/30/24 230 lb (104.3 kg)  06/25/24 227 lb (103 kg)  05/29/24 212 lb (96.2 kg)  05/26/24 214 lb 4.6 oz (97.2 kg)  05/18/24 214 lb 3.2 oz (97.2 kg)    BMI Readings from Last 5 Encounters:  06/30/24 29.53 kg/m  06/25/24 29.15 kg/m  05/29/24 27.22 kg/m  05/26/24 27.51 kg/m  05/18/24 27.50 kg/m     Physical Exam General: Sitting up, no distress Eyes: No icterus Neck: No JVP Pulmonary: Clear, distant, normal work of breathing Cardiovascular: Warm Abdomen: Nondistended Neuro: Ambulates with rollator   Assessment & Plan:    Dyspnea on exertion: Present for years, on questioning he thinks things started with anterior approach to cervical spine surgery which was in 2013.  Etiology is likely multifactorial.  Suspect chronic bronchitis in the setting of 30-pack-year smoking history.  He has thickened bronchioles on CT scan.  Also, he clearly endorses what sounds like aspiration events of gastro esophageal reflux at night with coughing and choking gagging.  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  PFTs cannot be completed.  No improvement Stiolto.  Durable improvement with Trelegy.  Rare albuterol  use but helps when he does use it.  Chronic low back pain: Sounds oxalic intermittent flare, dull knife sensation right lower back.  Prednisone taper to see if it aids in the short-term.  He has upcoming pain management appointment.    Return in about 1 year (around 06/30/2025) for f/u Dr. Annella.   Donnice JONELLE Annella, MD 06/30/2024

## 2024-07-05 ENCOUNTER — Ambulatory Visit (HOSPITAL_BASED_OUTPATIENT_CLINIC_OR_DEPARTMENT_OTHER)
Admission: RE | Admit: 2024-07-05 | Discharge: 2024-07-05 | Disposition: A | Discharge status: Home / Self Care | Source: Ambulatory Visit | Attending: Internal Medicine | Admitting: Internal Medicine

## 2024-07-05 DIAGNOSIS — Z87891 Personal history of nicotine dependence: Secondary | ICD-10-CM | POA: Insufficient documentation

## 2024-08-27 ENCOUNTER — Ambulatory Visit: Admitting: Primary Care

## 2024-08-27 ENCOUNTER — Encounter: Payer: Self-pay | Admitting: Primary Care

## 2024-08-27 VITALS — BP 136/78 | HR 92 | Temp 97.5°F | Ht 74.0 in | Wt 232.0 lb

## 2024-08-27 DIAGNOSIS — G4733 Obstructive sleep apnea (adult) (pediatric): Secondary | ICD-10-CM

## 2024-08-27 DIAGNOSIS — Z9682 Presence of neurostimulator: Secondary | ICD-10-CM | POA: Diagnosis not present

## 2024-08-27 DIAGNOSIS — G4700 Insomnia, unspecified: Secondary | ICD-10-CM

## 2024-08-27 DIAGNOSIS — Z87891 Personal history of nicotine dependence: Secondary | ICD-10-CM | POA: Diagnosis not present

## 2024-08-27 DIAGNOSIS — J9611 Chronic respiratory failure with hypoxia: Secondary | ICD-10-CM

## 2024-08-27 MED ORDER — ZOLPIDEM TARTRATE 10 MG PO TABS: 10.0000 mg | ORAL_TABLET | Freq: Every evening | ORAL | 3 refills | Status: AC | PRN

## 2024-08-27 NOTE — Progress Notes (Signed)
 "  @Patient  ID: Nathaniel Colon, male    DOB: September 06, 1960, 64 y.o.   MRN: [id number]  No chief complaint on file.   Referring provider: [facility], [facility]  HPI: 64 year old male, former smoker. PMH significant for CHF, chronic respiratory failure, GERD, type 2 diabetes, sleep apnea.    Previous [facility] pulmonary encounter:  10/06/2023 Patient presents today for Inspire activation. He was last seen by our office in May by Dr. Annella for dyspnea symptoms. Hx sleep apnea, intolerant to CPAP. Uses continuous O2 2L/min 24 hours.    > Sleep study done through the [facility], AHI 71.5  > Intolerant to CPAP  > Hypoglossal nerve stimulator (Inspire) implant date 08/25/23 with Dr. Carlie      Discussed the use of AI scribe software for clinical note transcription with the patient, who gave verbal consent to proceed.   History of Present Illness   Nathaniel Colon is a 64 year old male who presents for activation of the Inspire device following implantation for sleep apnea.   The Inspire device was implanted in January after a sleep study conducted several months prior through the [facility]. He had previously attempted CPAP therapy but was unable to tolerate it. Since the implantation, he has not experienced any issues, including trouble swallowing or pain at the site.   He uses supplemental oxygen at a rate of two liters per minute while at rest. His current oxygen tank lasts about an hour.   Regarding his sleep, he typically goes to bed between 10 and 11 PM, taking melatonin around 8 to 9 PM to aid in falling asleep, which usually takes about an hour. He wakes up multiple times during the night to urinate, sometimes as frequently as twenty times, affecting his ability to return to sleep quickly.       Sensation level - 0.9 Functional level- 0.7 V (lower limit 0.7 - upper limit 1.7) Start delay- 60 mins Pause delay - 20 mins Sleep duration-  9 hours     01/07/2024 Discussed the use of AI scribe software  for clinical note transcription with the patient, who gave verbal consent to proceed.   History of Present Illness   Nathaniel Colon is a 64 year old male with sleep apnea who presents for 4-6 week follow-up following inspire activation on 10/06/23.    He experiences ongoing difficulties with sleep primarily due to his sleep apnea. Inspire use is inconsistent. On nights he uses the device, he does not wake himself up snoring, which was a previous issue. However, he struggles to fall asleep within the hour the device is active and often has to pause or restart it. He also experiences frequent awakenings at night to urinate, which disrupts his sleep further.   He is currently using a lidocaine  patch for pain management, applying it as needed. He has not been prescribed any other pain medications and is awaiting an appointment with a pain specialist. For sleep, he uses melatonin.   He has not increased the level of his Inspire device recently. He reports feeling the device's stimulation when awake, which can be uncomfortable. A previous sleep study recorded seventy-one apneic events per hour. No waking up snoring on nights he uses the [facility] device.   He has difficulty falling asleep and frequent awakenings at night.      02/09/2024 Discussed the use of AI scribe software for clinical note transcription with the patient, who gave verbal consent to proceed.   History  of Present Illness   Nathaniel Colon is a 64 year old male with severe sleep apnea who presents for follow-up regarding his hypoglossal nerve stimulator.   He has severe sleep apnea, initially diagnosed through sleep studies conducted by the [facility], which showed intolerance to CPAP therapy. He underwent implantation of a hypoglossal nerve stimulator on January 13th, and the device was activated in February. He has difficulty remembering to increase the device's settings weekly, having only done so once or twice this month. He uses the device  nightly, with usage averaging about 40 hours per week, equating to approximately five hours per night. He is currently on level four of the device, with an amplitude of 1.0, and has not experienced any discomfort or pain from its use. He also uses supplemental oxygen at night, at a rate of two liters.   He continues to experience difficulty falling and staying asleep, despite the use of Lunesta , which he takes at a dose of 1 mg. He takes the medication between 8 to 10 PM, but still struggles with sleep onset, sometimes not falling asleep until midnight or later. He notes that when he does fall asleep, his sleep quality improves significantly when using Inspire, as he is not snoring or waking himself up. He acknowledges a subjective benefit from the device, stating 'when I am sleeping, I sleep a whole lot better.'   No trouble swallowing.    Sleep Sync compliance report 12/21/2023 - 01/19/2024 Total nights used 7/30 days (23%) greater than 4 hours Average hours per night used 8 hours 21 minutes Average pauses per night 1.0 Current amplitude 0.9 voltage (upper limit 1.7V/lower limit 0.7V)    03/10/2024 Discussed the use of AI scribe software for clinical note transcription with the patient, who gave verbal consent to proceed.  History of Present Illness   Nathaniel Colon is a 64 year old male with sleep apnea who presents for a readiness check for Inspire titration.  He has been using the Inspire device and recently increased the level to seven. He manages the settings independently and notes a benefit from the device, although he experiences some discomfort for a couple of days after increasing the level. Some nights, frequent bathroom trips prevent adequate sleep.  He uses oxygen at night at a rate of two liters. Previously, he took Lunesta  at a dose of two mg but found it too sedating. He has since adjusted his regimen, currently taking Lunesta  without melatonin, and reports improved sleep without  grogginess, waking up around 6:30 to 7:00 AM and staying awake throughout the day.  He continues to use Trelegy once a day for his breathing and occasionally uses a rescue inhaler, approximately once every other week, when experiencing difficulty breathing. No recent respiratory infections or colored mucus.  He experiences nocturia, waking up at night to use the restroom frequently, which affects his sleep. He is taking tamsulosin  (Flomax ) as prescribed. He also experiences pain at times, which can wake him up at night.      Sleep Sync compliance report Total nights used /30 days () greater than 4 hours Average hours per night used  Average pauses per night  Current amplitude 1.3 voltage (upper limit 1.7V/lower limit 0.7V)   05/14/2024 Discussed the use of AI scribe software for clinical note transcription with the patient, who gave verbal consent to proceed.  History of Present Illness Nathaniel Colon is a 64 year old male with sleep apnea who presents with difficulty sleeping and  issues with his Inspire device settings. He had a sleep study done through the [facility] which showed severe OSA with AHI 71.5/hr.  He underwent Inspire titration study on 04/22/24, inspire titration to 1.6V with residual AHI (4%) 14.6/hour with oxygen saturation nadir 86%. Inspire settings were adjusted during his titration study with range 1.3-1.6. Current level 2 (1.4V).   He has been experiencing difficulty sleeping since his last in-lab sleep study, where he initially slept well. Despite sleeping well during the titration study, he has not slept much at all since then. His usage of the Inspire device is only five hours per week, and he has been unable to sleep effectively. The Inspire device, when set at a higher level, causes discomfort, particularly in his tongue, which wakes him up.   He has not been taking his prescribed sleep medications, including Lunesta , due to side effects such as grogginess and brain fog. He  has also stopped taking gabapentin , which was prescribed for neuropathy in his feet, as it did not provide relief and contributed to his cognitive side effects.  He has been trying to manage his sleep by taking naps during the day when possible. He frequently resets the device, with a start delay of an hour and fifteen minutes and a pause time of twenty-five minutes, in an attempt to fall asleep. He prefers not to sleep than to experience the side effects of the medications he was previously taking.   06/25/2024 Discussed the use of AI scribe software for clinical note transcription with the patient, who gave verbal consent to proceed.  History of Present Illness HANZ WINTERHALTER is a 64 year old male with severe sleep apnea who presents with insomnia and difficulty using the [facility] device.  He has a history of severe sleep apnea. Sleep study done through the [facility], AHI 71.5. Inspire device placed in January 2024 after being intolerant to CPAP therapy. He underwent a titration study in September 2024, but has since struggled with sleep.  He experiences significant insomnia, stating he has not slept well since the titration study. He attributes his sleep difficulties to stress related to unemployment and financial issues, having lost his job in December 2023. He has gained approximately 15-20 pounds in the past month due to these stressors. He is currently taking Lunesta  at 1 mg nightly around 8 to 9 PM, but it does not help him sleep, as he only manages to take short naps during the day. He experiences frequent nocturia, urinating up to 20 times a night, which disrupts his sleep further.  He describes difficulty using the [facility] device, as it causes discomfort when it activates while he is awake. When he is asleep, the device does not bother him. He has not been able to increase the device's settings due to being awake when it activates.  He reports gastrointestinal issues, including vomiting at night  due to food not passing through his stomach. He mentions his blood sugar levels have been fluctuating significantly, ranging from over 500 to the 50s, which adds to his stress and impacts his overall health. He is seeing a digestive specialist for further evaluation.  Sleep sync 03/11/24-06/08/24 Usage 52/90 days (58%); 35/90 days (39%) >4 hours Usage per night 5 hours 36 min Amplitude 1.4 (range 1.3-1.7V) Average pause 2.7   1. OSA (obstructive sleep apnea) (Primary)   2. Insomnia, unspecified type     Assessment and Plan Assessment & Plan Insomnia in the setting of severe obstructive sleep apnea status post  Inspire device Severe obstructive sleep apnea with intolerance to CPAP. Inspire device placed in January 2024 with titration study in September 2024. Reports poor sleep quality, frequent daytime naps, and stress-related insomnia. Currently on Lunesta  1 mg at night, but experiencing difficulty falling asleep. Appears he slept better while taking a combination for Lunesta  and melatonin back in August prior to titration study. Medication was decreased due to reported daytime grogginess. Device settings may be contributing to discomfort when awake. Current device level is 1.5 (level 3), with average usage of 5 hours 36 minutes per night. Primary issue identified as insomnia, with device settings adjusted to improve comfort and facilitate better sleep onset. - Increased Lunesta  dose to 2 mg at night. - Instructed to turn on Inspire device within 30 minutes of taking Lunesta . - Adjusted Inspire device settings and lower range to level 2 as the starting point, allowing for gradual increase as sleep improves. - Instructed to call in a week or two to report on the effectiveness of the increased Lunesta  dose. - Will consider adding melatonin if insomnia persists after increasing Lunesta  dose.   Gastroesophageal reflux symptoms Reports frequent vomiting at night when lying down, with food not  passing through the stomach. Symptoms suggestive of gastroesophageal reflux, potentially contributing to insomnia. Scheduled for further evaluation by a GI specialist. - Recommended trying G[employer]met RX, a liquid seaweed supplement, to coat and protect the esophageal lining and help with reflux. - Continue follow-up with GI specialist for further evaluation of stomach emptying issues.    08/27/2024 Discussed the use of AI scribe software for clinical note transcription with the patient, who gave verbal consent to proceed.  > Sleep study done through the [facility], AHI 71.5  > Intolerant to CPAP  > Hypoglossal nerve stimulator (Inspire) implant date 08/25/23 with Dr. Carlie  > Inspire activated February 2025  History of Present Illness Nathaniel Colon is a 64 year old male with severe sleep apnea who presents with insomnia and difficulty using the [facility] device. He is accompanied by Randall from [place].  He has been experiencing significant difficulty sleeping, with only two nights in the past month where he managed to sleep for six to seven hours. He is currently taking Lunesta  2 mg but has previously tried gabapentin  and melatonin. He recalls better sleep when using melatonin with Lunesta  but is unsure due to the prolonged period of poor sleep. No current use of gabapentin  due to adverse effects such as excessive sleepiness and feeling 'out of my mind'.  He experiences difficulty falling asleep, often lying awake despite turning everything off. Pain contributes to his insomnia, but he is not currently taking any pain medication. He is under evaluation at a pain clinic where he is conducting tests, including MRIs, for his chronic back and neck pain. He reports a history of neck surgery and neuropathy. He describes pain radiating from his neck, down his back, hips, legs, and to his knees, with neuropathy in his feet.  He has a history of severe sleep apnea, with a sleep study at the [facility] showing 71 apneic events  per hour. He was unable to tolerate CPAP therapy and had an Inspire device placed in January 2025, activated in February 2025. He was last using the device about a month ago but has not been using it regularly due to his sleep issues. He reports that when he was using the [facility] device regularly, he was sleeping well. He has not been using it frequently, which has led  to discomfort when he attempts to use it now.  No alcohol  use and he uses multiple pillows to sleep, indicating he sleeps on both his back and side. He has difficulty managing the [facility] app and often relies on his brother for assistance.  Lower limit 0.4V - Upper limit 1.4V Start delay 75 min Pause time 25 min Therapy duration 9 hours  Pulse width 90; Rate 33   Allergies[1]  Immunization History  Administered Date(s) Administered   Influenza, Seasonal, Injecte, Preservative Fre 05/18/2024   Influenza,inj,Quad PF,6+ Mos 06/21/2021, 07/15/2022   Moderna Sars-Covid-2 Vaccination 11/18/2019, 12/16/2019   PNEUMOCOCCAL CONJUGATE-20 06/21/2021   Tdap 08/13/2015   Zoster Recombinant(Shingrix) 04/28/2020, 10/25/2020    Past Medical History:  Diagnosis Date   Anginal pain    Arthritis    Chronic kidney disease    Complication of anesthesia    patient reports hard to wake up after having EGD   COPD (chronic obstructive pulmonary disease) (HCC)    Coronary artery disease    Depression    Diabetes mellitus    Diabetic gastroparesis (HCC)    Dyspnea    GERD (gastroesophageal reflux disease)    H/O hiatal hernia    Hypertension    Neuromuscular disorder (HCC)    MUSCLE SPASMS   Peripheral vascular disease    PERIPHERAL NEUROPATHY    Sleep apnea     Tobacco History: Tobacco Use History[2] Counseling given: Not Answered   Outpatient Medications Prior to Visit  Medication Sig Dispense Refill   albuterol  (VENTOLIN  HFA) 108 (90 Base) MCG/ACT inhaler Inhale 2 puffs into the lungs every 6 (six) hours as needed for  wheezing or shortness of breath. 1 each 6   aluminum hydroxide-magnesium carbonate (ACID GONE) 95-358 MG/15ML SUSP Take 30 mLs by mouth 4 (four) times daily as needed for heartburn.     amLODipine  (NORVASC ) 5 MG tablet Take 1 tablet (5 mg total) by mouth daily. 30 tablet 2   aspirin  EC 81 MG EC tablet Take 1 tablet (81 mg total) by mouth daily. 30 tablet 0   bethanechol (URECHOLINE) 10 MG tablet Take 10 mg by mouth 3 (three) times daily.     capsaicin (ZOSTRIX) 0.025 % cream Apply 1 Application topically 2 (two) times daily as needed (foot pain).     carboxymethylcellulose (REFRESH PLUS) 0.5 % SOLN Place 1-2 drops into both eyes as needed (irritation).     cholecalciferol  (VITAMIN D3) 25 MCG (1000 UNIT) tablet Take 1,000 Units by mouth daily.     diclofenac  Sodium (VOLTAREN ) 1 % GEL Apply 1 Application topically 2 (two) times daily as needed (back pain).     DULoxetine  (CYMBALTA ) 60 MG capsule Take 60 mg by mouth daily.     esomeprazole (NEXIUM) 40 MG capsule Take 40 mg by mouth 2 (two) times daily before a meal.     eszopiclone  (LUNESTA ) 2 MG TABS tablet Take 1 tablet (2 mg total) by mouth at bedtime as needed for sleep. Take immediately before bedtime 30 tablet 2   famotidine (PEPCID) 40 MG tablet Take 40 mg by mouth at bedtime.     ferrous sulfate  325 (65 FE) MG tablet Take 1 tablet (325 mg total) by mouth 3 (three) times a week. Take on Monday, Wednesday, and friday 100 tablet 3   finasteride  (PROSCAR ) 5 MG tablet Take 1 tablet (5 mg total) by mouth daily. 30 tablet 2   Fluticasone -Umeclidin-Vilant (TRELEGY ELLIPTA ) 100-62.5-25 MCG/ACT AEPB Inhale 1 puff into the lungs in the  morning. 60 each 12   Glucagon, rDNA, (GLUCAGON EMERGENCY) 1 MG KIT Inject 1 mg into the vein as needed (low blood sugar).     glucose 4 GM chewable tablet Chew 2 tablets by mouth as needed for low blood sugar.     Insulin  Aspart FlexPen (NOVOLOG ) 100 UNIT/ML Inject 35-50 Units into the skin in the morning, at noon, and at  bedtime.     insulin  glargine-yfgn (SEMGLEE) 100 UNIT/ML injection Inject 40 Units into the skin 2 (two) times daily.     lidocaine  (LIDODERM ) 5 % Place 1 patch onto the skin daily as needed (pain). Apply to back     metoCLOPramide  (REGLAN ) 10 MG tablet Take 5 mg by mouth in the morning and at bedtime.     metoprolol  succinate (TOPROL -XL) 50 MG 24 hr tablet Take 0.5 tablets (25 mg total) by mouth daily. 30 tablet 2   nitroGLYCERIN  (NITROSTAT ) 0.4 MG SL tablet Place 0.4 mg under the tongue every 5 (five) minutes as needed for chest pain.     rosuvastatin  (CRESTOR ) 40 MG tablet Take 20 mg by mouth daily.     senna-docusate (SENOKOT-S) 8.6-50 MG tablet Take 2 tablets by mouth at bedtime.     sitaGLIPtin (JANUVIA) 50 MG tablet Take 50 mg by mouth daily.     sucralfate (CARAFATE) 1 GM/10ML suspension Take 1 g by mouth 4 (four) times daily as needed (acid reflux).     tamsulosin  (FLOMAX ) 0.4 MG CAPS capsule Take 2 capsules (0.8 mg total) by mouth at bedtime. 60 capsule 2   vitamin B-12 (CYANOCOBALAMIN ) 500 MCG tablet Take 1,000 mcg by mouth daily.     No facility-administered medications prior to visit.   Review of Systems  Review of Systems  Respiratory: Negative.     Physical Exam  There were no vitals taken for this visit. Physical Exam Constitutional:      Appearance: Normal appearance. He is well-developed.  HENT:     Head: Normocephalic and atraumatic.     Mouth/Throat:     Mouth: Mucous membranes are moist.     Pharynx: Oropharynx is clear.  Cardiovascular:     Rate and Rhythm: Normal rate and regular rhythm.     Heart sounds: Normal heart sounds.  Pulmonary:     Effort: Pulmonary effort is normal. No respiratory distress.     Breath sounds: Normal breath sounds. No wheezing or rhonchi.  Musculoskeletal:        General: Normal range of motion.     Cervical back: Normal range of motion and neck supple.  Skin:    General: Skin is warm and dry.     Findings: No erythema or  rash.  Neurological:     General: No focal deficit present.     Mental Status: He is alert and oriented to person, place, and time. Mental status is at baseline.  Psychiatric:        Mood and Affect: Mood normal.        Behavior: Behavior normal.        Thought Content: Thought content normal.        Judgment: Judgment normal.     Lab Results:  CBC    Component Value Date/Time   WBC 11.3 (H) 05/29/2024 1015   RBC 5.20 05/29/2024 1015   HGB 15.9 05/29/2024 1015   HCT 49.5 05/29/2024 1015   PLT 258 05/29/2024 1015   MCV 95.2 05/29/2024 1015   MCH 30.6 05/29/2024 1015  MCHC 32.1 05/29/2024 1015   RDW 14.4 05/29/2024 1015   LYMPHSABS 2.2 05/26/2024 1542   MONOABS 0.7 05/26/2024 1542   EOSABS 0.2 05/26/2024 1542   BASOSABS 0.1 05/26/2024 1542    BMET    Component Value Date/Time   NA 134 (L) 05/29/2024 1015   K 4.1 05/29/2024 1015   CL 95 (L) 05/29/2024 1015   CO2 25 05/29/2024 1015   GLUCOSE 408 (H) 05/29/2024 1015   BUN 22 05/29/2024 1015   CREATININE 1.69 (H) 05/29/2024 1015   CALCIUM  8.7 (L) 05/29/2024 1015   GFRNONAA 46 (L) 05/29/2024 1015   GFRAA >60 03/15/2016 1253    BNP    Component Value Date/Time   BNP 19.0 06/18/2022 1203    ProBNP No results found for: PROBNP  Imaging: No results found.   Assessment & Plan:   1. Chronic hypoxic respiratory failure (HCC) (Primary)  Assessment and Plan Assessment & Plan Obstructive sleep apnea with Inspire device Severe obstructive sleep apnea with 71 apneic events per hour. CPAP was not tolerated. Inspire device placed in January 2025 and activated in February 2025. Current settings are too strong due to infrequent use, causing discomfort. Functional level was 0.7 at initial activation. Current settings need adjustment to improve comfort and efficacy. - Reset Inspire device settings to a lower functional level of 0.4V with an upper limit of 1.4V. - Instructed to increase device level weekly, starting from  level one, to find the most comfortable setting. - Advised to use the device when taking Ambien  and to adjust settings as needed for comfort. - Provided contact information for Inspire patient services for app login issues.  Insomnia Chronic insomnia with difficulty falling asleep and maintaining sleep. Pain and discomfort contribute to sleep disturbances. Lunesta  was ineffective. Gabapentin  was ineffective and caused excessive sedation. Ambien  was used 13 years ago and will be retried. - Discontinued Lunesta . - Prescribed Ambien  10 mg for sleep. - Instructed to call in a couple of weeks to report on the effectiveness of Ambien . - Advised to follow up with pain specialist for ongoing pain management.  Recording duration: 29 minutes  I personally spent a total of 40 minutes in the care of the patient today including performing a medically appropriate exam/evaluation, counseling and educating, and placing orders.  Almarie LELON Ferrari, NP 08/27/2024     [1] No Known Allergies [2]  Social History Tobacco Use  Smoking Status Former   Current packs/day: 0.00   Average packs/day: 0.5 packs/day for 30.0 years (15.0 ttl pk-yrs)   Types: Cigarettes   Start date: 33   Quit date: 2018   Years since quitting: 8.0  Smokeless Tobacco Never   "

## 2024-08-27 NOTE — Patient Instructions (Signed)
" °  VISIT SUMMARY: During your visit, we discussed your severe sleep apnea and insomnia. You have been experiencing significant difficulty sleeping and have had trouble using your Inspire device. We reviewed your current medications and sleep patterns, and made adjustments to your treatment plan to help improve your sleep quality.  YOUR PLAN: -OBSTRUCTIVE SLEEP APNEA WITH INSPIRE DEVICE: Obstructive sleep apnea is a condition where your airway becomes blocked during sleep, causing breathing interruptions. You have an Inspire device to help manage this condition, but the current settings are too strong due to infrequent use. We have reset the device settings to a lower functional level and instructed you to gradually increase the level weekly to find the most comfortable setting. You should use the device when taking Ambien  and adjust the settings as needed for comfort. If you have issues with the app, contact [facility] patient services for assistance.  -INSOMNIA: Insomnia is a condition where you have difficulty falling asleep or staying asleep. Your insomnia is worsened by pain and discomfort. We have discontinued Lunesta  and prescribed Ambien  10 mg to help you sleep. Please call us  in a couple of weeks to report on the effectiveness of Ambien . Continue to follow up with your pain specialist for ongoing pain management.  INSTRUCTIONS: Please call us  in a couple of weeks to report on the effectiveness of Ambien . Continue to follow up with your pain specialist for ongoing pain management.  Follow-up Please schedule Inspire follow-up in 4-6 weeks with [place]ry NP - only (coordinate with con if needed)  "

## 2024-09-09 ENCOUNTER — Other Ambulatory Visit (HOSPITAL_COMMUNITY): Payer: Self-pay | Admitting: Chiropractic Medicine

## 2024-09-09 DIAGNOSIS — M542 Cervicalgia: Secondary | ICD-10-CM

## 2024-09-09 DIAGNOSIS — M5440 Lumbago with sciatica, unspecified side: Secondary | ICD-10-CM

## 2024-09-24 ENCOUNTER — Encounter: Admitting: Primary Care

## 2025-02-03 ENCOUNTER — Ambulatory Visit (HOSPITAL_COMMUNITY): Admit: 2025-02-03

## 2025-02-03 ENCOUNTER — Other Ambulatory Visit (HOSPITAL_COMMUNITY)
